# Patient Record
Sex: Female | Born: 1935 | Race: Black or African American | Hispanic: No | Marital: Married | Smoking: Never smoker
Health system: Southern US, Community
[De-identification: ages and names within clinical notes are randomized; demographics above are authoritative.]

## PROBLEM LIST (undated history)

## (undated) DIAGNOSIS — Z79810 Long term (current) use of selective estrogen receptor modulators (SERMs): Secondary | ICD-10-CM

## (undated) DIAGNOSIS — I10 Essential (primary) hypertension: Secondary | ICD-10-CM

## (undated) DIAGNOSIS — E559 Vitamin D deficiency, unspecified: Secondary | ICD-10-CM

## (undated) DIAGNOSIS — C50919 Malignant neoplasm of unspecified site of unspecified female breast: Secondary | ICD-10-CM

## (undated) DIAGNOSIS — A0472 Enterocolitis due to Clostridium difficile, not specified as recurrent: Secondary | ICD-10-CM

## (undated) DIAGNOSIS — Z923 Personal history of irradiation: Secondary | ICD-10-CM

## (undated) DIAGNOSIS — J329 Chronic sinusitis, unspecified: Secondary | ICD-10-CM

## (undated) DIAGNOSIS — M109 Gout, unspecified: Secondary | ICD-10-CM

## (undated) DIAGNOSIS — E785 Hyperlipidemia, unspecified: Secondary | ICD-10-CM

## (undated) DIAGNOSIS — M1990 Unspecified osteoarthritis, unspecified site: Secondary | ICD-10-CM

## (undated) HISTORY — PX: CATARACT EXTRACTION, BILATERAL: SHX1313

## (undated) HISTORY — DX: Vitamin D deficiency, unspecified: E55.9

## (undated) HISTORY — DX: Essential (primary) hypertension: I10

## (undated) HISTORY — DX: Unspecified osteoarthritis, unspecified site: M19.90

## (undated) HISTORY — DX: Personal history of irradiation: Z92.3

## (undated) HISTORY — DX: Hyperlipidemia, unspecified: E78.5

## (undated) HISTORY — DX: Gout, unspecified: M10.9

## (undated) HISTORY — PX: TONSILLECTOMY: SUR1361

## (undated) HISTORY — DX: Long term (current) use of selective estrogen receptor modulators (serms): Z79.810

## (undated) HISTORY — DX: Enterocolitis due to Clostridium difficile, not specified as recurrent: A04.72

## (undated) HISTORY — DX: Chronic sinusitis, unspecified: J32.9

---

## 1992-06-17 HISTORY — PX: ABDOMINAL HYSTERECTOMY: SHX81

## 2000-02-01 HISTORY — PX: BREAST SURGERY: SHX581

## 2010-10-25 ENCOUNTER — Encounter: Payer: Self-pay | Admitting: Internal Medicine

## 2010-10-25 DIAGNOSIS — E785 Hyperlipidemia, unspecified: Secondary | ICD-10-CM | POA: Insufficient documentation

## 2010-10-25 DIAGNOSIS — I10 Essential (primary) hypertension: Secondary | ICD-10-CM

## 2010-12-05 ENCOUNTER — Ambulatory Visit (HOSPITAL_COMMUNITY)
Admission: RE | Admit: 2010-12-05 | Discharge: 2010-12-05 | Disposition: A | Discharge status: Home / Self Care | Payer: PRIVATE HEALTH INSURANCE | Source: Ambulatory Visit | Attending: Internal Medicine | Admitting: Internal Medicine

## 2010-12-05 ENCOUNTER — Other Ambulatory Visit: Payer: Self-pay | Admitting: Internal Medicine

## 2010-12-05 DIAGNOSIS — M545 Low back pain, unspecified: Secondary | ICD-10-CM | POA: Insufficient documentation

## 2010-12-05 DIAGNOSIS — R52 Pain, unspecified: Secondary | ICD-10-CM

## 2010-12-05 DIAGNOSIS — M47817 Spondylosis without myelopathy or radiculopathy, lumbosacral region: Secondary | ICD-10-CM | POA: Insufficient documentation

## 2010-12-05 DIAGNOSIS — M79609 Pain in unspecified limb: Secondary | ICD-10-CM | POA: Insufficient documentation

## 2011-06-14 ENCOUNTER — Encounter (INDEPENDENT_AMBULATORY_CARE_PROVIDER_SITE_OTHER): Payer: Self-pay | Admitting: General Surgery

## 2011-06-19 ENCOUNTER — Ambulatory Visit (INDEPENDENT_AMBULATORY_CARE_PROVIDER_SITE_OTHER): Payer: Medicare Other | Admitting: General Surgery

## 2011-06-19 ENCOUNTER — Encounter (INDEPENDENT_AMBULATORY_CARE_PROVIDER_SITE_OTHER): Payer: Self-pay | Admitting: General Surgery

## 2011-06-19 VITALS — BP 158/92 | HR 82 | Temp 98.2°F | Resp 18 | Ht 60.0 in | Wt 210.0 lb

## 2011-06-19 DIAGNOSIS — R920 Mammographic microcalcification found on diagnostic imaging of breast: Secondary | ICD-10-CM

## 2011-06-19 NOTE — Patient Instructions (Signed)
There is an abnormal area of calcifications in your right breast. The biopsy shows atypical ductal hyperplasia, which could lead to cancer. It is also possible that there is early cancer already present. You will be scheduled for a right breast biopsy with needle localization in the near future.  Breast Biopsy WHY YOU NEED A BIOPSY Your caregiver has recommended that you have a breast tissue sample taken (biopsy). This is done to be certain that the lump or abnormality found in your breast is not cancerous (malignant). During a biopsy, a small piece of tissue is removed, so it can be examined under a microscope by a specialist (pathologist) who looks at tissues and cells and diagnoses abnormalities in them. Most lumps (tumors) or abnormalities, on or in the breast, are not cancerous (benign). However, biopsies are taken when your caregiver cannot be absolutely certain of what is wrong only from doing a physical exam, mammogram (breast X-ray), or other studies. A breast biopsy can tell you whether nothing more needs to be done, or you need more surgery or another type of treatment. A biopsy is done when there is:  Any undiagnosed breast mass.   Nipple abnormalities, dimpling, crusting, or ulcerations.   Calcium deposits (calcifications) or abnormalities seen on your mammogram, ultrasound, or MRI.   Suspicious changes in the breast (thickening, asymmetry) seen on mammogram.   Abnormal discharge from the nipple, especially blood.   Redness, swelling, and pain of the breast.  HOW A BIOPSY IS PERFORMED A biopsy is often performed on an outpatient basis (you go home the same day). This can be done in a hospital, clinic, or surgical center. Tissue samples (biopsies) are often done under local anesthesia (area is numbed). Sometimes general anesthetics are required, in which case you sleep through the procedure. Biopsies may remove the entire lump, a small piece of the lump, or a small sliver of tissue  removed by needle. TYPES OF BREAST BIOPSY  Fine needle aspiration. A thin needle is placed through the skin, to the lump or cyst, and cells are removed.   Core needle biopsy. A large needle with a special tip is placed through the skin, to the abnormality, and a piece of tissue is removed.   Stereotactic biopsy. A core needle with a special X-ray is used, to direct the needle to the lump or abnormal area, which is difficult to feel or cannot be felt.   Vacuum-assisted biopsy. A hollow probe and a gentle vacuum remove a sample of tissue.   Ultrasound guided core needle biopsy. You lie on your stomach, with your breast through an opening, and a high frequency ultrasound helps guide the needle to the area of the abnormality.   Open biopsy. An incision is made in the breast, and a piece of the lump or the whole lump is removed.  LET YOUR CAREGIVER KNOW ABOUT:  Allergies.   Medicines taken, including herbs, eye drops, over-the-counter medicines, and creams.   Use of steroids (by mouth or creams).   Previous problems with anesthetics or Novocaine.   If you are taking aspirin or blood thinners.   Possibility of pregnancy, if this applies.   History of blood clots (thrombophlebitis).   History of bleeding or blood problems.   Previous surgery.   Other health problems.  RISKS AND COMPLICATIONS   Bleeding.   Infection.   Allergy to medicines.   Bruising and swelling of the breast.   Alteration in the shape of the breast.   Not finding  the lump or abnormality.   Needing more surgery.  BEFORE THE PROCEDURE  You should arrive 60 minutes prior to your procedure or as directed.   Check-in at the admissions desk, to fill out necessary forms, if you are not preregistered.   There will be consent forms to sign, prior to the procedure.   There is a waiting area for your family, while you are having your biopsy.   Try to have someone with you, to drive you home.   Do not  smoke for 2 weeks before the surgery.   Let your caregiver know if you develop a cold or an infection.   Do not drink alcohol for at least 24 hours before surgery.   Wear a good support bra to the surgery.  AFTER THE PROCEDURE  After surgery, you will be taken to the recovery area, where a nurse will watch and check your progress. Once you are awake, stable, and taking fluids well, if there are no other problems, you will be allowed to go home.   Ice packs applied to your operative site may help with discomfort and keep the swelling down.   You may resume normal diet and activities as directed. Avoid strenuous activities affecting the arm on the side of the biopsy, such as tennis, swimming, heavy lifting (more than 10 pounds) or pulling.   Bruising in the breast is normal following this procedure.   Wearing a support bra, even to bed, may be more comfortable. The bra will also help keep the dressing on.   Change dressings as directed.   Your doctor may apply a pressure dressing on your breast for 24 to 48 hours.   Only take over-the-counter or prescription medicines for pain, discomfort, or fever as directed by your caregiver.   Do not take aspirin, because it can cause bleeding.  HOME CARE INSTRUCTIONS   You may resume your usual diet.   Have someone drive you home after the surgery.   Do not do any exercise, driving, lifting or general activities without your caregiver's permission.   Take medicines and over-the-counter medicines, as ordered by your caregiver.   Keep your postoperative appointments as recommended.   Do not drink alcohol while taking pain medicine.  Finding out the results of your test Not all test results are available during your visit. If your test results are not back during the visit, make an appointment with your caregiver to find out the results. Do not assume everything is normal if you have not heard from your caregiver or the medical facility. It is  important for you to follow up on all of your test results.  SEEK MEDICAL CARE IF:   You notice redness, swelling, or increasing pain in the wound.   You notice a bad smell coming from the wound or dressing.   You develop a rash.   You need stronger pain medicine.   You are having an allergic reaction or problems with your medicines.  SEEK IMMEDIATE MEDICAL CARE IF:   You have difficulty breathing.   You have a fever.   There is increased bleeding (more than a small spot) from the wound.   Pus is coming from the wound.   The wound is breaking open.  Document Released: 06/03/2005 Document Revised: 02/13/2011 Document Reviewed: 04/21/2009 [facility] Patient Information 2012 [place], [place].

## 2011-06-19 NOTE — Progress Notes (Signed)
Patient ID: Nicole Holland, female   DOB: 08-Jan-1936, 76 y.o.   MRN: [id number]  Chief Complaint  Patient presents with  . Other    Eval abnormal biopsy - atypical ductal hyperplasia    HPI Nicole Holland is a 76 y.o. female.  She is referred by Dr. Jeralyn Ruths for evaluation of abnormal mammogram of the right breast and biopsy showing atypical ductal hyperplasia. Dr. Margaretmary Bayley is her primary care physician.  The patient has a past history of a right breast biopsy in 2001 in [place]. This was a papilloma with focal atypia. She had image guided core biopsy of the left breast in 2002 in [place] which showed fibrocystic disease and calcifications. She's had no other breast problems. She moved to [place] in 2006 to be closer to her son.  Mammograms last year looked fine except for a 9 mm mass in the left breast at the 12:00 position which was thought to be a cyst. Mammograms on May 15, 2011 showed a new area of pleomorphic calcifications in the right breast at the 12:00 position thought to be category 4. Core biopsy was performed on May 23, 2011 which shows atypical ductal hyperplasia and a fibroadenoma with calcifications. She was referred for excision of this area to rule out occult malignancy.  The patient does not have any other breast problems. She does not perceive a mass pain or discharge. Her family history is negative for breast or ovarian cancer. HPI  Past Medical History  Diagnosis Date  . Hypertension   . Hyperlipidemia   . Arthritis   . Asthma     Past Surgical History  Procedure Date  . Abdominal hysterectomy 1994    partial  . Breast surgery 02/01/2000    right breast subareolar core biopsy    History reviewed. No pertinent family history.  Social History History  Substance Use Topics  . Smoking status: Never Smoker   . Smokeless tobacco: Never Used  . Alcohol Use: No    Allergies  Allergen Reactions  . Iodides Itching    All over the body.  .  Penicillins Itching    All over the body.    Current Outpatient Prescriptions  Medication Sig Dispense Refill  . albuterol (PROAIR HFA) 108 (90 BASE) MCG/ACT inhaler Inhale 2 puffs into the lungs every 6 (six) hours as needed.        . Calcium Carbonate-Vit D-Min (CALCIUM 1200 PO) Take by mouth.        . Cholecalciferol (VITAMIN D) 1000 UNITS capsule Take 1,000 Units by mouth daily.        Marland Kitchen diltiazem (DILACOR XR) 240 MG 24 hr capsule Take 240 mg by mouth daily.        Marland Kitchen latanoprost (XALATAN) 0.005 % ophthalmic solution Place 1 drop into both eyes at bedtime.        Marland Kitchen loratadine (CLARITIN) 10 MG tablet Take 10 mg by mouth daily.        Marland Kitchen losartan-hydrochlorothiazide (HYZAAR) 100-25 MG per tablet Take 1 tablet by mouth daily.        . mometasone (NASONEX) 50 MCG/ACT nasal spray Place 2 sprays into the nose daily.        . NON FORMULARY as needed. Taking "Patalax" 0.2% eye drops into both eyes as needed.       . NON FORMULARY 5 mLs as needed. Patient using OTC Eye Itch Relief from [facility] - Ketotifen fumarate eye solution       .  Polyethylene Glycol 400 (BLINK TEARS) 0.25 % SOLN Apply to eye.        . simvastatin (ZOCOR) 20 MG tablet Take 20 mg by mouth at bedtime.        . vitamin C (ASCORBIC ACID) 500 MG tablet Take 500 mg by mouth daily.        . budesonide-formoterol (SYMBICORT) 160-4.5 MCG/ACT inhaler Inhale 2 puffs into the lungs 2 (two) times daily.        . Multiple Vitamins-Minerals (CENTRUM SILVER PO) Take by mouth.        . olmesartan-hydrochlorothiazide (BENICAR HCT) 40-25 MG per tablet Take 1 tablet by mouth daily.        . Omega-3 Fatty Acids (FISH OIL) 1200 MG CAPS Take by mouth.          Review of Systems Review of Systems  Constitutional: Negative for fever, chills and unexpected weight change.  HENT: Negative for hearing loss, congestion, sore throat, trouble swallowing and voice change.   Eyes: Negative for visual disturbance.  Respiratory: Negative for cough and  wheezing.   Cardiovascular: Negative for chest pain, palpitations and leg swelling.  Gastrointestinal: Negative for nausea, vomiting, abdominal pain, diarrhea, constipation, blood in stool, abdominal distention and anal bleeding.  Genitourinary: Negative for hematuria, vaginal bleeding and difficulty urinating.  Musculoskeletal: Positive for joint swelling. Negative for arthralgias.  Skin: Negative for rash and wound.  Neurological: Negative for seizures, syncope and headaches.  Hematological: Negative for adenopathy. Does not bruise/bleed easily.  Psychiatric/Behavioral: Negative for confusion.    Blood pressure 158/92, pulse 82, temperature 98.2 F (36.8 C), temperature source Temporal, resp. rate 18, height 5' (1.524 m), weight 210 lb (95.255 kg).  Physical Exam Physical Exam  Constitutional: She is oriented to person, place, and time. She appears well-developed and well-nourished. No distress.  HENT:  Head: Normocephalic and atraumatic.  Nose: Nose normal.  Mouth/Throat: No oropharyngeal exudate.  Eyes: Conjunctivae and EOM are normal. Pupils are equal, round, and reactive to light. Left eye exhibits no discharge. No scleral icterus.  Neck: Neck supple. No JVD present. No tracheal deviation present. No thyromegaly present.  Cardiovascular: Normal rate, regular rhythm, normal heart sounds and intact distal pulses.   No murmur heard. Pulmonary/Chest: Effort normal and breath sounds normal. No respiratory distress. She has no wheezes. She has no rales. She exhibits no tenderness.    Abdominal: Soft. Bowel sounds are normal. She exhibits no distension and no mass. There is no tenderness. There is no rebound and no guarding.  Musculoskeletal: She exhibits edema. She exhibits no tenderness.  Lymphadenopathy:    She has no cervical adenopathy.  Neurological: She is alert and oriented to person, place, and time. She exhibits normal muscle tone. Coordination normal.  Skin: Skin is warm.  No rash noted. She is not diaphoretic. No erythema. No pallor.  Psychiatric: She has a normal mood and affect. Her behavior is normal. Judgment and thought content normal.    Data Reviewed I have reviewed all of her imaging studies and her pathology report. Also reviewed the records from [place] that she brought with her.  Assessment    Abnormal mammograms and atypical ductal hyperplasia, right breast, 12:00 position. Agree that excision of this area is indicated to a exclude occult malignancy.  Past history excisional biopsy right breast, 9:00 position.  Asthma  Hypertension  Hyperlipidemia  Status post vaginal hysterectomy.  Obesity     Plan    I had a long discussion with the patient. At  the end she wanted to go ahead with excision of the abnormal area in her right breast.  I have discussed indications and details of surgery with her. Risks and complications have been outlined, including but not limited to bleeding, infection, reoperation if this turns out to be a cancer, cosmetic deformity, cardiac, pulmonary and thromboembolic problems. She understands these issues. All questions answered. She agrees with the plan.       Rani Idler M 06/19/2011, 2:44 PM

## 2011-06-20 ENCOUNTER — Encounter (INDEPENDENT_AMBULATORY_CARE_PROVIDER_SITE_OTHER): Payer: Self-pay | Admitting: Internal Medicine

## 2011-07-29 ENCOUNTER — Encounter (HOSPITAL_BASED_OUTPATIENT_CLINIC_OR_DEPARTMENT_OTHER)
Admission: RE | Admit: 2011-07-29 | Discharge: 2011-07-29 | Disposition: A | Discharge status: Home / Self Care | Payer: Medicare Other | Source: Ambulatory Visit | Attending: General Surgery | Admitting: General Surgery

## 2011-07-29 ENCOUNTER — Encounter (HOSPITAL_BASED_OUTPATIENT_CLINIC_OR_DEPARTMENT_OTHER): Payer: Self-pay | Admitting: *Deleted

## 2011-07-29 NOTE — Progress Notes (Signed)
To come in for ekg,cxr,bmet

## 2011-08-01 ENCOUNTER — Ambulatory Visit
Admission: RE | Admit: 2011-08-01 | Discharge: 2011-08-01 | Disposition: A | Discharge status: Home / Self Care | Payer: Medicare Other | Source: Ambulatory Visit | Attending: Anesthesiology | Admitting: Anesthesiology

## 2011-08-01 ENCOUNTER — Encounter (HOSPITAL_BASED_OUTPATIENT_CLINIC_OR_DEPARTMENT_OTHER)
Admission: RE | Admit: 2011-08-01 | Discharge: 2011-08-01 | Disposition: A | Discharge status: Home / Self Care | Payer: Medicare Other | Source: Ambulatory Visit | Attending: General Surgery | Admitting: General Surgery

## 2011-08-01 LAB — BASIC METABOLIC PANEL
CO2: 28 mEq/L (ref 19–32)
Calcium: 10.1 mg/dL (ref 8.4–10.5)
Chloride: 101 mEq/L (ref 96–112)
Glucose, Bld: 175 mg/dL — ABNORMAL HIGH (ref 70–99)
Potassium: 4.2 mEq/L (ref 3.5–5.1)
Sodium: 138 mEq/L (ref 135–145)

## 2011-08-01 NOTE — H&P (Signed)
Nicole Holland    MRN: [id number]   Description: 76 year old female  Provider: [name], MD  Department: [facility]      Diagnoses     Abnormal mammogram with microcalcification   - Primary    793.81      Reason for Visit     Other    Eval abnormal biopsy - atypical ductal hyperplasia        Vitals - Last Recorded       BP Pulse Temp(Src) Resp Ht Wt    158/92  82  98.2 F (36.8 C) (Temporal)  18  5' (1.524 m)  210 lb (95.255 kg)     BMI - 41.01 kg/m2                History and Physical    [name], MD   Patient ID: Nicole Holland, female   DOB: 03-23-1936, 76 y.o.   MRN: [id number]    Chief Complaint   Patient presents with   .  Other       Eval abnormal biopsy - atypical ductal hyperplasia      HPI [name] is a 76 y.o. female.  She is referred by Dr. Jeralyn Ruths for evaluation of abnormal mammogram of the right breast and biopsy showing atypical ductal hyperplasia. Dr. Margaretmary Bayley is her primary care physician.  The patient has a past history of a right breast biopsy in 2001 in [place]. This was a papilloma with focal atypia. She had image guided core biopsy of the left breast in 2002 in [place] which showed fibrocystic disease and calcifications. She's had no other breast problems. She moved to [place] in 2006 to be closer to her son.  Mammograms last year looked fine except for a 9 mm mass in the left breast at the 12:00 position which was thought to be a cyst. Mammograms on May 15, 2011 showed a new area of pleomorphic calcifications in the right breast at the 12:00 position thought to be category 4. Core biopsy was performed on May 23, 2011 which shows atypical ductal hyperplasia and a fibroadenoma with calcifications. She was referred for excision of this area to rule out occult malignancy.  The patient does not have any other breast problems. She does not perceive a mass pain or discharge. Her family history is negative  for breast or ovarian cancer.    Past Medical History   Diagnosis  Date   .  Hypertension     .  Hyperlipidemia     .  Arthritis     .  Asthma         Past Surgical History   Procedure  Date   .  Abdominal hysterectomy  1994       partial   .  Breast surgery  02/01/2000       right breast subareolar core biopsy    History reviewed. No pertinent family history.   Social History History   Substance Use Topics   .  Smoking status:  Never Smoker    .  Smokeless tobacco:  Never Used   .  Alcohol Use:  No       Allergies   Allergen  Reactions   .  Iodides  Itching       All over the body.   .  Penicillins  Itching       All over the body.       Current Outpatient Prescriptions  Medication  Sig  Dispense  Refill   .  albuterol (PROAIR HFA) 108 (90 BASE) MCG/ACT inhaler  Inhale 2 puffs into the lungs every 6 (six) hours as needed.           .  Calcium Carbonate-Vit D-Min (CALCIUM 1200 PO)  Take by mouth.           .  Cholecalciferol (VITAMIN D) 1000 UNITS capsule  Take 1,000 Units by mouth daily.           Marland Kitchen  diltiazem (DILACOR XR) 240 MG 24 hr capsule  Take 240 mg by mouth daily.           Marland Kitchen  latanoprost (XALATAN) 0.005 % ophthalmic solution  Place 1 drop into both eyes at bedtime.           Marland Kitchen  loratadine (CLARITIN) 10 MG tablet  Take 10 mg by mouth daily.           Marland Kitchen  losartan-hydrochlorothiazide (HYZAAR) 100-25 MG per tablet  Take 1 tablet by mouth daily.           .  mometasone (NASONEX) 50 MCG/ACT nasal spray  Place 2 sprays into the nose daily.           .  NON FORMULARY  as needed. Taking "Patalax" 0.2% eye drops into both eyes as needed.          .  NON FORMULARY  5 mLs as needed. Patient using OTC Eye Itch Relief from [facility] - Ketotifen fumarate eye solution          .  Polyethylene Glycol 400 (BLINK TEARS) 0.25 % SOLN  Apply to eye.           .  simvastatin (ZOCOR) 20 MG tablet  Take 20 mg by mouth at bedtime.           .  vitamin C (ASCORBIC ACID) 500 MG tablet   Take 500 mg by mouth daily.           .  budesonide-formoterol (SYMBICORT) 160-4.5 MCG/ACT inhaler  Inhale 2 puffs into the lungs 2 (two) times daily.           .  Multiple Vitamins-Minerals (CENTRUM SILVER PO)  Take by mouth.           .  olmesartan-hydrochlorothiazide (BENICAR HCT) 40-25 MG per tablet  Take 1 tablet by mouth daily.           .  Omega-3 Fatty Acids (FISH OIL) 1200 MG CAPS  Take by mouth.            Review of Systems   Constitutional: Negative for fever, chills and unexpected weight change.  HENT: Negative for hearing loss, congestion, sore throat, trouble swallowing and voice change.   Eyes: Negative for visual disturbance.  Respiratory: Negative for cough and wheezing.   Cardiovascular: Negative for chest pain, palpitations and leg swelling.  Gastrointestinal: Negative for nausea, vomiting, abdominal pain, diarrhea, constipation, blood in stool, abdominal distention and anal bleeding.  Genitourinary: Negative for hematuria, vaginal bleeding and difficulty urinating.  Musculoskeletal: Positive for joint swelling. Negative for arthralgias.  Skin: Negative for rash and wound.  Neurological: Negative for seizures, syncope and headaches.  Hematological: Negative for adenopathy. Does not bruise/bleed easily.  Psychiatric/Behavioral: Negative for confusion.    Blood pressure 158/92, pulse 82, temperature 98.2 F (36.8 C), temperature source Temporal, resp. rate 18, height 5' (1.524 m), weight 210 lb (95.255 kg).  Physical Exam   Constitutional: She is oriented to person, place, and time. She appears well-developed and well-nourished. No distress.  HENT:   Head: Normocephalic and atraumatic.   Nose: Nose normal.   Mouth/Throat: No oropharyngeal exudate.  Eyes: Conjunctivae and EOM are normal. Pupils are equal, round, and reactive to light. Left eye exhibits no discharge. No scleral icterus.  Neck: Neck supple. No JVD present. No tracheal deviation present. No thyromegaly  present.  Cardiovascular: Normal rate, regular rhythm, normal heart sounds and intact distal pulses.    No murmur heard. Pulmonary/Chest: Effort normal and breath sounds normal. No respiratory distress. She has no wheezes. She has no rales. She exhibits no tenderness.  Breasts:  Old scar at 9 oclock on right. Biopsy site at 12 oclock on right. Left breast exam unremarkable  Abdominal: Soft. Bowel sounds are normal. She exhibits no distension and no mass. There is no tenderness. There is no rebound and no guarding.  Musculoskeletal: She exhibits edema. She exhibits no tenderness.  Lymphadenopathy:    She has no cervical adenopathy.  Neurological: She is alert and oriented to person, place, and time. She exhibits normal muscle tone. Coordination normal.  Skin: Skin is warm. No rash noted. She is not diaphoretic. No erythema. No pallor.  Psychiatric: She has a normal mood and affect. Her behavior is normal. Judgment and thought content normal.  .  Data Reviewed I have reviewed all of her imaging studies and her pathology report. Also reviewed the records from [place] that she brought with her.   Assessment   Abnormal mammograms and atypical ductal hyperplasia, right breast, 12:00 position. Agree that excision of this area is indicated to a exclude occult malignancy.  Past history excisional biopsy right breast, 9:00 position.  Asthma  Hypertension  Hyperlipidemia  Status post vaginal hysterectomy.  Obesity     Plan I had a long discussion with the patient. At the end she wanted to go ahead with excision of the abnormal area in her right breast.  I have discussed indications and details of surgery with her. Risks and complications have been outlined, including but not limited to bleeding, infection, reoperation if this turns out to be a cancer, cosmetic deformity, cardiac, pulmonary and thromboembolic problems. She understands these issues. All questions answered. She agrees  with the plan.    Angelia Mould. Derrell Lolling, M.D., [facility] General and Minimally invasive Surgery Breast and Colorectal Surgery Office:   [phone] Pager:   [phone]

## 2011-08-02 ENCOUNTER — Ambulatory Visit (HOSPITAL_BASED_OUTPATIENT_CLINIC_OR_DEPARTMENT_OTHER)
Admission: RE | Admit: 2011-08-02 | Discharge: 2011-08-02 | Disposition: A | Discharge status: Home / Self Care | Payer: Medicare Other | Source: Ambulatory Visit | Attending: General Surgery | Admitting: General Surgery

## 2011-08-02 ENCOUNTER — Encounter (HOSPITAL_BASED_OUTPATIENT_CLINIC_OR_DEPARTMENT_OTHER): Payer: Self-pay | Admitting: Certified Registered Nurse Anesthetist

## 2011-08-02 ENCOUNTER — Encounter (HOSPITAL_BASED_OUTPATIENT_CLINIC_OR_DEPARTMENT_OTHER)
Admission: RE | Disposition: A | Discharge status: Home / Self Care | Payer: Self-pay | Source: Ambulatory Visit | Attending: General Surgery

## 2011-08-02 ENCOUNTER — Other Ambulatory Visit (INDEPENDENT_AMBULATORY_CARE_PROVIDER_SITE_OTHER): Payer: Self-pay | Admitting: General Surgery

## 2011-08-02 ENCOUNTER — Ambulatory Visit (HOSPITAL_BASED_OUTPATIENT_CLINIC_OR_DEPARTMENT_OTHER): Payer: Medicare Other | Admitting: Certified Registered Nurse Anesthetist

## 2011-08-02 DIAGNOSIS — C50919 Malignant neoplasm of unspecified site of unspecified female breast: Secondary | ICD-10-CM

## 2011-08-02 DIAGNOSIS — R920 Mammographic microcalcification found on diagnostic imaging of breast: Secondary | ICD-10-CM

## 2011-08-02 DIAGNOSIS — D0590 Unspecified type of carcinoma in situ of unspecified breast: Secondary | ICD-10-CM

## 2011-08-02 DIAGNOSIS — J45909 Unspecified asthma, uncomplicated: Secondary | ICD-10-CM | POA: Insufficient documentation

## 2011-08-02 DIAGNOSIS — I10 Essential (primary) hypertension: Secondary | ICD-10-CM | POA: Insufficient documentation

## 2011-08-02 DIAGNOSIS — Z01812 Encounter for preprocedural laboratory examination: Secondary | ICD-10-CM | POA: Insufficient documentation

## 2011-08-02 HISTORY — DX: Malignant neoplasm of unspecified site of unspecified female breast: C50.919

## 2011-08-02 HISTORY — PX: BREAST LUMPECTOMY: SHX2

## 2011-08-02 SURGERY — BREAST BIOPSY WITH NEEDLE LOCALIZATION
Anesthesia: General | Site: Breast | Laterality: Right

## 2011-08-02 MED ORDER — PROPOFOL 10 MG/ML IV EMUL
INTRAVENOUS | Status: DC | PRN
  Administered 2011-08-02: 200 mg via INTRAVENOUS

## 2011-08-02 MED ORDER — LIDOCAINE HCL (CARDIAC) 20 MG/ML IV SOLN
INTRAVENOUS | Status: DC | PRN
  Administered 2011-08-02: 60 mg via INTRAVENOUS

## 2011-08-02 MED ORDER — ACETAMINOPHEN 650 MG RE SUPP: 650.0000 mg | RECTAL | Status: DC | PRN

## 2011-08-02 MED ORDER — HYDROCODONE-ACETAMINOPHEN 5-325 MG PO TABS: 1.0000 | ORAL_TABLET | ORAL | Status: AC | PRN

## 2011-08-02 MED ORDER — DEXAMETHASONE SODIUM PHOSPHATE 4 MG/ML IJ SOLN
INTRAMUSCULAR | Status: DC | PRN
  Administered 2011-08-02: 4 mg via INTRAVENOUS

## 2011-08-02 MED ORDER — MIDAZOLAM HCL 2 MG/2ML IJ SOLN: 0.5000 mg | INTRAMUSCULAR | Status: DC | PRN

## 2011-08-02 MED ORDER — METOCLOPRAMIDE HCL 5 MG/ML IJ SOLN: 10.0000 mg | Freq: Once | INTRAMUSCULAR | Status: DC | PRN

## 2011-08-02 MED ORDER — MIDAZOLAM HCL 5 MG/5ML IJ SOLN: INTRAMUSCULAR | Status: DC | PRN

## 2011-08-02 MED ORDER — OXYCODONE HCL 5 MG PO TABS: 5.0000 mg | ORAL_TABLET | ORAL | Status: DC | PRN

## 2011-08-02 MED ORDER — EPHEDRINE SULFATE 50 MG/ML IJ SOLN
INTRAMUSCULAR | Status: DC | PRN
  Administered 2011-08-02: 10 mg via INTRAVENOUS

## 2011-08-02 MED ORDER — SODIUM CHLORIDE 0.9 % IV SOLN: INTRAVENOUS | Status: DC

## 2011-08-02 MED ORDER — ACETAMINOPHEN 325 MG PO TABS: 650.0000 mg | ORAL_TABLET | ORAL | Status: DC | PRN

## 2011-08-02 MED ORDER — SODIUM CHLORIDE 0.9 % IV SOLN: 250.0000 mL | INTRAVENOUS | Status: DC | PRN

## 2011-08-02 MED ORDER — BUPIVACAINE-EPINEPHRINE 0.5% -1:200000 IJ SOLN
INTRAMUSCULAR | Status: DC | PRN
  Administered 2011-08-02: 20 mL

## 2011-08-02 MED ORDER — CEFAZOLIN SODIUM-DEXTROSE 2-3 GM-% IV SOLR
2.0000 g | INTRAVENOUS | Status: AC
  Administered 2011-08-02: 2 g via INTRAVENOUS

## 2011-08-02 MED ORDER — ONDANSETRON HCL 4 MG/2ML IJ SOLN
INTRAMUSCULAR | Status: DC | PRN
  Administered 2011-08-02: 4 mg via INTRAVENOUS

## 2011-08-02 MED ORDER — ACETAMINOPHEN 10 MG/ML IV SOLN
1000.0000 mg | Freq: Once | INTRAVENOUS | Status: AC
  Administered 2011-08-02: 1000 mg via INTRAVENOUS

## 2011-08-02 MED ORDER — LACTATED RINGERS IV SOLN
INTRAVENOUS | Status: DC
  Administered 2011-08-02: 09:00:00 via INTRAVENOUS

## 2011-08-02 MED ORDER — FENTANYL CITRATE 0.05 MG/ML IJ SOLN: 25.0000 ug | INTRAMUSCULAR | Status: DC | PRN

## 2011-08-02 MED ORDER — PROMETHAZINE HCL 25 MG/ML IJ SOLN: 12.5000 mg | Freq: Four times a day (QID) | INTRAMUSCULAR | Status: DC | PRN

## 2011-08-02 MED ORDER — ONDANSETRON HCL 4 MG/2ML IJ SOLN: 4.0000 mg | Freq: Four times a day (QID) | INTRAMUSCULAR | Status: DC | PRN

## 2011-08-02 MED ORDER — SODIUM CHLORIDE 0.9 % IJ SOLN: 3.0000 mL | Freq: Two times a day (BID) | INTRAMUSCULAR | Status: DC

## 2011-08-02 MED ORDER — SODIUM CHLORIDE 0.9 % IJ SOLN: 3.0000 mL | INTRAMUSCULAR | Status: DC | PRN

## 2011-08-02 MED ORDER — FENTANYL CITRATE 0.05 MG/ML IJ SOLN: 50.0000 ug | INTRAMUSCULAR | Status: DC | PRN

## 2011-08-02 MED ORDER — MORPHINE SULFATE 2 MG/ML IJ SOLN: 0.0500 mg/kg | INTRAMUSCULAR | Status: DC | PRN

## 2011-08-02 MED ORDER — FENTANYL CITRATE 0.05 MG/ML IJ SOLN
INTRAMUSCULAR | Status: DC | PRN
  Administered 2011-08-02: 50 ug via INTRAVENOUS

## 2011-08-02 SURGICAL SUPPLY — 54 items
BANDAGE ELASTIC 6 VELCRO ST LF (GAUZE/BANDAGES/DRESSINGS) IMPLANT
BENZOIN TINCTURE PRP APPL 2/3 (GAUZE/BANDAGES/DRESSINGS) IMPLANT
BLADE HEX COATED 2.75 (ELECTRODE) ×2 IMPLANT
BLADE SURG 15 STRL LF DISP TIS (BLADE) ×2 IMPLANT
BLADE SURG 15 STRL SS (BLADE) ×2
CANISTER SUCTION 1200CC (MISCELLANEOUS) ×2 IMPLANT
CHLORAPREP W/TINT 26ML (MISCELLANEOUS) ×2 IMPLANT
CLOTH BEACON ORANGE TIMEOUT ST (SAFETY) ×2 IMPLANT
COVER MAYO STAND STRL (DRAPES) ×2 IMPLANT
COVER TABLE BACK 60X90 (DRAPES) ×2 IMPLANT
DECANTER SPIKE VIAL GLASS SM (MISCELLANEOUS) IMPLANT
DERMABOND ADVANCED (GAUZE/BANDAGES/DRESSINGS) ×1
DERMABOND ADVANCED .7 DNX12 (GAUZE/BANDAGES/DRESSINGS) ×1 IMPLANT
DEVICE DUBIN W/COMP PLATE 8390 (MISCELLANEOUS) ×2 IMPLANT
DRAPE LAPAROTOMY TRNSV 102X78 (DRAPE) IMPLANT
DRAPE PED LAPAROTOMY (DRAPES) ×2 IMPLANT
DRAPE UTILITY XL STRL (DRAPES) ×2 IMPLANT
ELECT REM PT RETURN 9FT ADLT (ELECTROSURGICAL) ×2
ELECTRODE REM PT RTRN 9FT ADLT (ELECTROSURGICAL) ×1 IMPLANT
GAUZE SPONGE 4X4 12PLY STRL LF (GAUZE/BANDAGES/DRESSINGS) IMPLANT
GAUZE SPONGE 4X4 16PLY XRAY LF (GAUZE/BANDAGES/DRESSINGS) IMPLANT
GLOVE BIOGEL M 7.0 STRL (GLOVE) ×2 IMPLANT
GLOVE ECLIPSE 6.5 STRL STRAW (GLOVE) ×4 IMPLANT
GLOVE EUDERMIC 7 POWDERFREE (GLOVE) ×4 IMPLANT
GLOVE INDICATOR 7.0 STRL GRN (GLOVE) ×2 IMPLANT
GOWN PREVENTION PLUS XLARGE (GOWN DISPOSABLE) ×2 IMPLANT
GOWN PREVENTION PLUS XXLARGE (GOWN DISPOSABLE) ×2 IMPLANT
KIT MARKER MARGIN INK (KITS) ×2 IMPLANT
NEEDLE HYPO 22GX1.5 SAFETY (NEEDLE) IMPLANT
NEEDLE HYPO 25X1 1.5 SAFETY (NEEDLE) ×2 IMPLANT
NS IRRIG 1000ML POUR BTL (IV SOLUTION) ×2 IMPLANT
PACK BASIN DAY SURGERY FS (CUSTOM PROCEDURE TRAY) ×2 IMPLANT
PENCIL BUTTON HOLSTER BLD 10FT (ELECTRODE) ×2 IMPLANT
SLEEVE SCD COMPRESS KNEE MED (MISCELLANEOUS) ×2 IMPLANT
SPONGE LAP 4X18 X RAY DECT (DISPOSABLE) ×2 IMPLANT
STAPLER VISISTAT 35W (STAPLE) IMPLANT
STRIP CLOSURE SKIN 1/2X4 (GAUZE/BANDAGES/DRESSINGS) IMPLANT
SUT ETHILON 4 0 PS 2 18 (SUTURE) IMPLANT
SUT MON AB 4-0 PC3 18 (SUTURE) ×2 IMPLANT
SUT SILK 2 0 SH (SUTURE) ×2 IMPLANT
SUT VIC AB 2-0 SH 27 (SUTURE)
SUT VIC AB 2-0 SH 27XBRD (SUTURE) IMPLANT
SUT VIC AB 3-0 FS2 27 (SUTURE) IMPLANT
SUT VIC AB 4-0 P-3 18XBRD (SUTURE) IMPLANT
SUT VIC AB 4-0 P3 18 (SUTURE)
SUT VICRYL 3-0 CR8 SH (SUTURE) ×2 IMPLANT
SUT VICRYL 4-0 PS2 18IN ABS (SUTURE) IMPLANT
SYR BULB 3OZ (MISCELLANEOUS) IMPLANT
SYR CONTROL 10ML LL (SYRINGE) ×2 IMPLANT
TAPE HYPAFIX 4 X10 (GAUZE/BANDAGES/DRESSINGS) IMPLANT
TOWEL OR NON WOVEN STRL DISP B (DISPOSABLE) ×2 IMPLANT
TUBE CONNECTING 20X1/4 (TUBING) ×2 IMPLANT
WATER STERILE IRR 1000ML POUR (IV SOLUTION) IMPLANT
YANKAUER SUCT BULB TIP NO VENT (SUCTIONS) ×2 IMPLANT

## 2011-08-02 NOTE — Anesthesia Postprocedure Evaluation (Signed)
Anesthesia Post Note  Patient: Nicole Holland  Procedure(s) Performed: Procedure(s) (LRB): BREAST BIOPSY WITH NEEDLE LOCALIZATION (Right)  Anesthesia type: General  Patient location: PACU  Post pain: Pain level controlled  Post assessment: Patient's Cardiovascular Status Stable  Last Vitals:  Filed Vitals:   08/02/11 1045  BP: 108/51  Pulse: 86  Temp:   Resp: 16    Post vital signs: Reviewed and stable  Level of consciousness: alert  Complications: No apparent anesthesia complications

## 2011-08-02 NOTE — Transfer of Care (Signed)
Immediate Anesthesia Transfer of Care Note  Patient: Nicole Holland  Procedure(s) Performed: Procedure(s) (LRB): BREAST BIOPSY WITH NEEDLE LOCALIZATION (Right)  Patient Location: PACU  Anesthesia Type: General  Level of Consciousness: sedated  Airway & Oxygen Therapy: Patient Spontanous Breathing and Patient connected to face mask oxygen  Post-op Assessment: Report given to PACU RN and Post -op Vital signs reviewed and stable  Post vital signs: Reviewed and stable  Complications: No apparent anesthesia complications

## 2011-08-02 NOTE — Op Note (Signed)
Patient Name:           Nicole Holland   Date of Surgery:        08/02/2011  Pre op Diagnosis:      Atypical ductal hyperplasia right breast  Post op Diagnosis:    same  Procedure:                 Right partial mastectomy with needle localization  Surgeon:                     Angelia Mould. Derrell Lolling, M.D., FACS  Assistant:                      none  Operative Indications:   [name]  is referred by Dr. Jeralyn Ruths for evaluation of abnormal mammogram of the right breast and biopsy showing atypical ductal hyperplasia. The patient has a past history of a right breast biopsy in 2001 in [place]. This was a papilloma with focal atypia. She had image guided core biopsy of the left breast in 2002 in [place] which showed fibrocystic disease and calcifications. She's had no other breast problems.  Mammograms last year looked fine except for a 9 mm mass in the left breast at the 12:00 position which was thought to be a cyst. Mammograms on May 15, 2011 showed a new area of pleomorphic calcifications in the right breast at the 12:00 position thought to be category 4. Core biopsy was performed on May 23, 2011 which shows atypical ductal hyperplasia and a fibroadenoma with calcifications. She was referred for excision of this area to rule out occult malignancy.  The patient does not have any other breast problems. She does not perceive a mass pain or discharge. Her family history is negative for breast or ovarian cancer.   Operative Findings:       The breast tissue that was removed looked normal. The marker clip and the wire were seen in the center of the specimen on the specimen mammogram.  Procedure in Detail:          The patient underwent wire localization of the area of abnormal calcifications in the right breast, 12:00 position this morning by Dr. Sharen Hones at [facility]. The wire position was very good. [facility]. Her x-rays were  reviewed. She was taken to the operating room. Gen. Anesthesia was induced. Surgical time out was performed. The right breast was prepped and draped in a sterile fashion. Intravenous antibiotics were given.  A transverse curvilinear incision was made at the 11:30 position of the right breast, just below the insertion site of the wire. Dissection was carried down into the breast tissue around the localizing wire. The specimen was removed and marked with silk sutures and the multicolor ink.kit.   Specimen mammogram confirmed that the localizing wire and the marker clip were within the center of the specimen. The specimen was sent to the pathology lab. The wound was irrigated with saline. Hemostasis was excellent. The breast tissue was closed in several layers with interrupted sutures of 3-0 Vicryl and the skin closed with a running subcuticular suture of 4-0 Monocryl and Dermabond. Ice pack was placed. Patient was taken to recovery room stable. There were no complications. Counts correct. EBL 10 cc.     Angelia Mould. Derrell Lolling, M.D., FACS General and Minimally Invasive Surgery Breast and Colorectal Surgery  08/02/2011 10:06 AM

## 2011-08-02 NOTE — Interval H&P Note (Signed)
History and Physical Interval Note:  08/02/2011 8:43 AM  Nicole Holland  has presented today for surgery, with the diagnosis of right breast mass   The goals of treatment and the various methods of treatment have been discussed with the patient and family. After consideration of risks, benefits and other options for treatment, the patient has consented to  Procedure(s) (LRB): RIGHT BREAST BIOPSY WITH NEEDLE LOCALIZATION (Right) as a surgical intervention .  The patients' history has been reviewed, patient examined, no change in status, stable for surgery.  I have reviewed the patients' chart and labs.  Questions were answered to the patient's satisfaction.     [name]

## 2011-08-02 NOTE — Anesthesia Procedure Notes (Signed)
Procedure Name: LMA Insertion Date/Time: 08/02/2011 9:25 AM Performed by: Janifer Gieselman D Pre-anesthesia Checklist: Patient identified, Emergency Drugs available, Suction available and Patient being monitored Patient Re-evaluated:Patient Re-evaluated prior to inductionOxygen Delivery Method: Circle System Utilized Preoxygenation: Pre-oxygenation with 100% oxygen Intubation Type: IV induction Ventilation: Mask ventilation without difficulty LMA: LMA with gastric port inserted LMA Size: 4.0 Number of attempts: 1 Placement Confirmation: positive ETCO2 Tube secured with: Tape Dental Injury: Teeth and Oropharynx as per pre-operative assessment

## 2011-08-02 NOTE — Discharge Instructions (Signed)
[facility] Office Phone Number [phone]  BREAST BIOPSY/ PARTIAL MASTECTOMY: POST OP INSTRUCTIONS  Always review your discharge instruction sheet given to you by the facility where your surgery was performed.  IF YOU HAVE DISABILITY OR FAMILY LEAVE FORMS, YOU MUST BRING THEM TO THE OFFICE FOR PROCESSING.  DO NOT GIVE THEM TO YOUR DOCTOR.  1. A prescription for pain medication may be given to you upon discharge.  Take your pain medication as prescribed, if needed.  If narcotic pain medicine is not needed, then you may take acetaminophen (Tylenol) or ibuprofen (Advil) as needed. 2. Take your usually prescribed medications unless otherwise directed 3. If you need a refill on your pain medication, please contact your pharmacy.  They will contact our office to request authorization.  Prescriptions will not be filled after 5pm or on week-ends. 4. You should eat very light the first 24 hours after surgery, such as soup, crackers, pudding, etc.  Resume your normal diet the day after surgery. 5. Most patients will experience some swelling and bruising in the breast.  Ice packs and a good support bra will help.  Swelling and bruising can take several days to resolve.  6. It is common to experience some constipation if taking pain medication after surgery.  Increasing fluid intake and taking a stool softener will usually help or prevent this problem from occurring.  A mild laxative (Milk of Magnesia or Miralax) should be taken according to package directions if there are no bowel movements after 48 hours. 7. Unless discharge instructions indicate otherwise, you may remove your bandages 24-48 hours after surgery, and you may shower at that time.  You may have steri-strips (small skin tapes) in place directly over the incision.  These strips should be left on the skin for 7-10 days.  If your surgeon used skin glue on the incision, you may shower in 24 hours.  The glue will flake off over the  next 2-3 weeks.  Any sutures or staples will be removed at the office during your follow-up visit. 8. ACTIVITIES:  You may resume regular daily activities (gradually increasing) beginning the next day.  Wearing a good support bra or sports bra minimizes pain and swelling.  You may have sexual intercourse when it is comfortable. a. You may drive when you no longer are taking prescription pain medication, you can comfortably wear a seatbelt, and you can safely maneuver your car and apply brakes. b. RETURN TO WORK:  ______________________________________________________________________________________ 9. You should see your doctor in the office for a follow-up appointment approximately two weeks after your surgery.  Your doctor's nurse will typically make your follow-up appointment when she calls you with your pathology report.  Expect your pathology report 2-3 business days after your surgery.  You may call to check if you do not hear from us after three days. 10. OTHER INSTRUCTIONS: _______________________________________________________________________________________________ _____________________________________________________________________________________________________________________________________ _____________________________________________________________________________________________________________________________________ _____________________________________________________________________________________________________________________________________  WHEN TO CALL YOUR DOCTOR: 1. Fever over 101.0 2. Nausea and/or vomiting. 3. Extreme swelling or bruising. 4. Continued bleeding from incision. 5. Increased pain, redness, or drainage from the incision.  The clinic staff is available to answer your questions during regular business hours.  Please don't hesitate to call and ask to speak to one of the nurses for clinical concerns.  If you have a medical emergency, go to the nearest  emergency room or call 911.  A surgeon from [facility] is always on call at the hospital.  For further questions, please visit [email or web address]    [facility]  [address] [place], [place] [address] [phone]   Post Anesthesia Home Care Instructions  Activity: Get plenty of rest for the remainder of the day. A responsible adult should stay with you for 24 hours following the procedure.  For the next 24 hours, DO NOT: -Drive a car -Operate machinery -Drink alcoholic beverages -Take any medication unless instructed by your physician -Make any legal decisions or sign important papers.  Meals: Start with liquid foods such as gelatin or soup. Progress to regular foods as tolerated. Avoid greasy, spicy, heavy foods. If nausea and/or vomiting occur, drink only clear liquids until the nausea and/or vomiting subsides. Call your physician if vomiting continues.  Special Instructions/Symptoms: Your throat may feel dry or sore from the anesthesia or the breathing tube placed in your throat during surgery. If this causes discomfort, gargle with warm salt water. The discomfort should disappear within 24 hours.   

## 2011-08-02 NOTE — Anesthesia Preprocedure Evaluation (Signed)
Anesthesia Evaluation  Patient identified by MRN, date of birth, ID band Patient awake    Reviewed: Allergy & Precautions, H&P , NPO status , Patient's Chart, lab work & pertinent test results, reviewed documented beta blocker date and time   Airway Mallampati: II TM Distance: >3 FB Neck ROM: full    Dental   Pulmonary asthma ,          Cardiovascular hypertension, On Medications     Neuro/Psych Negative Neurological ROS  Negative Psych ROS   GI/Hepatic negative GI ROS, Neg liver ROS,   Endo/Other  Morbid obesity  Renal/GU negative Renal ROS  Genitourinary negative   Musculoskeletal   Abdominal   Peds  Hematology negative hematology ROS (+)   Anesthesia Other Findings See surgeon's H&P   Reproductive/Obstetrics negative OB ROS                           Anesthesia Physical Anesthesia Plan  ASA: III  Anesthesia Plan: General   Post-op Pain Management:    Induction: Intravenous  Airway Management Planned: LMA  Additional Equipment:   Intra-op Plan:   Post-operative Plan: Extubation in OR  Informed Consent: I have reviewed the patients History and Physical, chart, labs and discussed the procedure including the risks, benefits and alternatives for the proposed anesthesia with the patient or authorized representative who has indicated his/her understanding and acceptance.     Plan Discussed with: CRNA and Surgeon  Anesthesia Plan Comments:         Anesthesia Quick Evaluation

## 2011-08-05 ENCOUNTER — Encounter (HOSPITAL_BASED_OUTPATIENT_CLINIC_OR_DEPARTMENT_OTHER): Payer: Self-pay | Admitting: General Surgery

## 2011-08-09 ENCOUNTER — Telehealth (INDEPENDENT_AMBULATORY_CARE_PROVIDER_SITE_OTHER): Payer: Self-pay

## 2011-08-09 ENCOUNTER — Telehealth (INDEPENDENT_AMBULATORY_CARE_PROVIDER_SITE_OTHER): Payer: Self-pay | Admitting: General Surgery

## 2011-08-09 ENCOUNTER — Other Ambulatory Visit (INDEPENDENT_AMBULATORY_CARE_PROVIDER_SITE_OTHER): Payer: Self-pay

## 2011-08-09 DIAGNOSIS — D0510 Intraductal carcinoma in situ of unspecified breast: Secondary | ICD-10-CM

## 2011-08-09 NOTE — Telephone Encounter (Signed)
  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 Breast diagnostic profile is pending. I called Nicole Holland and discussed this with her. She will be scheduled for a bilateral breast MRI and then an appointment to see me to discuss next steps   [facility]. Derrell Lolling, M.D., [facility] General and Minimally invasive Surgery Breast and Colorectal Surgery Office:   [phone] Pager:   [phone]

## 2011-08-09 NOTE — Telephone Encounter (Signed)
Message copied by Joanette Gula on Fri Aug 09, 2011  4:13 PM ------      Message from: [name]      Created: Fri Aug 09, 2011  2:25 PM      Regarding: RE: Path result      Contact: [phone]       I spoke to Ms. Sica and her son Mellody Dance. She was told she has DCIS with a close margin.            Please schedule her for bilateral breast MRI and then an appt. to see me after the MRI.            Thanks.                  ----- Message -----         From: Joanette Gula, LPN         Sent: 08/08/2011   3:42 PM           To: [name], MD      Subject: Path result                                              Pt calling for path result. Please view path and advise me. This pt had atypical cells at pre op visit and surgical path show dcis. Pt probably needs call from you. She can be reached at [phone]. Thanks [employer]

## 2011-08-17 ENCOUNTER — Ambulatory Visit
Admission: RE | Admit: 2011-08-17 | Discharge: 2011-08-17 | Disposition: A | Discharge status: Home / Self Care | Payer: Medicare Other | Source: Ambulatory Visit | Attending: General Surgery | Admitting: General Surgery

## 2011-08-17 DIAGNOSIS — D0510 Intraductal carcinoma in situ of unspecified breast: Secondary | ICD-10-CM

## 2011-08-17 MED ORDER — GADOBENATE DIMEGLUMINE 529 MG/ML IV SOLN
9.0000 mL | Freq: Once | INTRAVENOUS | Status: AC | PRN
  Administered 2011-08-17: 9 mL via INTRAVENOUS

## 2011-08-20 ENCOUNTER — Telehealth (INDEPENDENT_AMBULATORY_CARE_PROVIDER_SITE_OTHER): Payer: Self-pay

## 2011-08-20 NOTE — Telephone Encounter (Signed)
Pt notified of MR result. Pt advised I will send msg to Dr Derrell Lolling to ask of pt needs any further referrals and call her with those if indicated.

## 2011-08-21 ENCOUNTER — Telehealth (INDEPENDENT_AMBULATORY_CARE_PROVIDER_SITE_OTHER): Payer: Self-pay

## 2011-08-21 NOTE — Telephone Encounter (Signed)
Per Dr  Jacinto Halim request I called to check on ERPR.  Per Angelica Chessman at [facility]. She also sees on path report that ERPR would be done but no order was placed. She will review this with Dr Raynald Blend and have ERPR ran.

## 2011-08-21 NOTE — Telephone Encounter (Signed)
Message copied by Joanette Gula on Wed Aug 21, 2011  9:35 AM ------      Message from: [name]      Created: Wed Aug 21, 2011  9:26 AM       Tell patient MRI shows no other areas of enhancement, which is good news.  Needs OV to discuss reexcision of margins, assuming she desires breast conservation.            Also, could not find ER/PR.

## 2011-08-26 ENCOUNTER — Telehealth (INDEPENDENT_AMBULATORY_CARE_PROVIDER_SITE_OTHER): Payer: Self-pay | Admitting: General Surgery

## 2011-08-26 NOTE — Telephone Encounter (Signed)
I had a lengthy phone conversation with Ms. Guereca today. I again explained her pathology report revealing 3 small foci of ductal carcinoma in situ. I told her this was hormone receptor positive. I told her that she had a very close anterior margin and that she may require further excision.  I advised her to see a medical oncologist and the radiation oncologist at this time. It was her preference to deferr those consultations until she could see me in the office on March 21 with her son and daughter.  After I see her in the office on March 21, I told her that I would plan to refer her to medical oncology and radiation oncology for any decisions were made regarding reexcision. She seemed reluctant to consider any further surgery. She also did not seem to understand the need for referral to medical oncology and radiation oncology. Hopefully this will become more obvious once we have a conversation with her and her family on March 21.   Angelia Mould. Derrell Lolling, M.D., [facility] General and Minimally invasive Surgery Breast and Colorectal Surgery Office:   [phone] Pager:   [phone]

## 2011-09-05 ENCOUNTER — Encounter (INDEPENDENT_AMBULATORY_CARE_PROVIDER_SITE_OTHER): Payer: Medicare Other | Admitting: General Surgery

## 2011-09-05 ENCOUNTER — Encounter (INDEPENDENT_AMBULATORY_CARE_PROVIDER_SITE_OTHER): Payer: Self-pay | Admitting: General Surgery

## 2011-09-05 ENCOUNTER — Ambulatory Visit (INDEPENDENT_AMBULATORY_CARE_PROVIDER_SITE_OTHER): Payer: Medicare Other | Admitting: General Surgery

## 2011-09-05 VITALS — BP 170/69 | HR 100 | Temp 98.2°F | Ht 60.0 in | Wt 207.4 lb

## 2011-09-05 DIAGNOSIS — D0510 Intraductal carcinoma in situ of unspecified breast: Secondary | ICD-10-CM

## 2011-09-05 DIAGNOSIS — D0590 Unspecified type of carcinoma in situ of unspecified breast: Secondary | ICD-10-CM

## 2011-09-05 NOTE — Progress Notes (Signed)
Patient ID: Nicole Holland, female   DOB: 01-24-1936, 76 y.o.   MRN: [id number]  Chief Complaint  Patient presents with  . Routine Post Op    po br mast    HPI  Nicole Holland is a 76 y.o. female.  She returns with her son to discuss her new diagnosis of DCIS right breast.  The patient recently underwent a right partial mastectomy with needle localization because of calcifications and atypical ductal hyperplasia seen on image guided biopsy. The final pathology report shows a low-grade ductal carcinoma in situ, receptor positive. There were 3 tiny foci of DCIS. One of the foci is less than 1 mm from the anterior margin. She has had MRI which shows no other abnormalities. I discussed this with her by phone and she is here today for further discussion and planning. I gave her a copy of the pathology report.   She has no problems with her wound healing. She is very comfortable.  We talked for a long time about ductal carcinoma in situ. I told her that I felt that this area should be conservatively reexcised, and that she should be referred to a medical oncologist and radiation oncologist. I offered to refer her to the medical oncologist prior to surgery or to proceed with the surgery at this time. After lengthy discussion she decided to proceed with a reexcision first and then would go ahead with the referrals. HPI  Past Medical History  Diagnosis Date  . Hypertension   . Hyperlipidemia   . Arthritis   . Asthma     Past Surgical History  Procedure Date  . Abdominal hysterectomy 1994    partial  . Breast surgery 02/01/2000    right breast subareolar core biopsy  . Cesarean section   . Tonsillectomy   . Breast biopsy 08/02/2011    Procedure: BREAST BIOPSY WITH NEEDLE LOCALIZATION;  Surgeon: [name], MD;  Location: [facility];  Service: General;  Laterality: Right;  excision right breast mass with needle localization at [facility]     History reviewed. No pertinent  family history.  Social History History  Substance Use Topics  . Smoking status: Never Smoker   . Smokeless tobacco: Never Used  . Alcohol Use: No    Allergies  Allergen Reactions  . Iodides Itching    All over the body.  . Penicillins Itching    All over the body.    Current Outpatient Prescriptions  Medication Sig Dispense Refill  . albuterol (PROAIR HFA) 108 (90 BASE) MCG/ACT inhaler Inhale 2 puffs into the lungs every 6 (six) hours as needed.        . budesonide-formoterol (SYMBICORT) 160-4.5 MCG/ACT inhaler Inhale 2 puffs into the lungs 2 (two) times daily.        . Calcium Carbonate-Vit D-Min (CALCIUM 1200 PO) Take by mouth.        . Cholecalciferol (VITAMIN D) 1000 UNITS capsule Take 1,000 Units by mouth daily.        Marland Kitchen diltiazem (DILACOR XR) 240 MG 24 hr capsule Take 240 mg by mouth daily.        Marland Kitchen latanoprost (XALATAN) 0.005 % ophthalmic solution Place 1 drop into both eyes at bedtime.        Marland Kitchen loratadine (CLARITIN) 10 MG tablet Take 10 mg by mouth daily.        Marland Kitchen losartan-hydrochlorothiazide (HYZAAR) 100-25 MG per tablet Take 1 tablet by mouth daily.        Marland Kitchen  mometasone (NASONEX) 50 MCG/ACT nasal spray Place 2 sprays into the nose daily.        . Multiple Vitamins-Minerals (CENTRUM SILVER PO) Take by mouth.        . NON FORMULARY as needed. Taking "Patalax" 0.2% eye drops into both eyes as needed.      . NON FORMULARY 5 mLs as needed. Patient using OTC Eye Itch Relief from [facility] - Ketotifen fumarate eye solution      . olmesartan-hydrochlorothiazide (BENICAR HCT) 40-25 MG per tablet Take 1 tablet by mouth daily.       . Omega-3 Fatty Acids (FISH OIL) 1200 MG CAPS Take by mouth.        . Polyethylene Glycol 400 (BLINK TEARS) 0.25 % SOLN Apply to eye.        . simvastatin (ZOCOR) 20 MG tablet Take 20 mg by mouth at bedtime.        . vitamin C (ASCORBIC ACID) 500 MG tablet Take 500 mg by mouth daily.          Review of Systems Review of Systems  Constitutional:  Negative for fever, chills and unexpected weight change.  HENT: Negative for hearing loss, congestion, sore throat, trouble swallowing and voice change.   Eyes: Negative for visual disturbance.  Respiratory: Negative for cough and wheezing.   Cardiovascular: Negative for chest pain, palpitations and leg swelling.  Gastrointestinal: Negative for nausea, vomiting, abdominal pain, diarrhea, constipation, blood in stool, abdominal distention and anal bleeding.  Genitourinary: Negative for hematuria, vaginal bleeding and difficulty urinating.  Musculoskeletal: Negative for arthralgias.  Skin: Negative for rash and wound.  Neurological: Negative for seizures, syncope and headaches.  Hematological: Negative for adenopathy. Does not bruise/bleed easily.  Psychiatric/Behavioral: Negative for confusion.    Blood pressure 170/69, pulse 100, temperature 98.2 F (36.8 C), temperature source Temporal, height 5' (1.524 m), weight 207 lb 6.4 oz (94.076 kg), SpO2 97.00%.  Physical Exam Physical Exam  Constitutional: She is oriented to person, place, and time. She appears well-developed and well-nourished. No distress.  Eyes: Conjunctivae and EOM are normal. Pupils are equal, round, and reactive to light. Left eye exhibits no discharge. No scleral icterus.  Neck: Neck supple. No JVD present. No tracheal deviation present. No thyromegaly present.  Cardiovascular: Normal rate, regular rhythm, normal heart sounds and intact distal pulses.   No murmur heard. Pulmonary/Chest: Effort normal and breath sounds normal. No respiratory distress. She has no wheezes. She has no rales. She exhibits no tenderness.       Recent incision right breast, 10:30 position, healing well. No fluid collection. No hematoma. No cosmetic defect. Nontender.  Musculoskeletal: She exhibits no edema and no tenderness.  Lymphadenopathy:    She has no cervical adenopathy.  Neurological: She is alert and oriented to person, place, and time.  She exhibits normal muscle tone. Coordination normal.  Skin: Skin is warm. No rash noted. She is not diaphoretic. No erythema. No pallor.  Psychiatric: She has a normal mood and affect. Her behavior is normal. Judgment and thought content normal.    Data Reviewed I have reviewed her pathology report and her MRI.  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  Very close anterior margin, less than 1 mm  Asthma  Diabetes mellitus.  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  Techniques of surgery and risks were discussed in detail. She understands these issues. She agrees with this plan. Her questions were answered. Her son concurs.  Referred to medical oncology and radiation oncology postop.       Angelia Mould. Derrell Lolling, M.D., [facility] General and Minimally invasive Surgery Breast and Colorectal Surgery Office:   [phone] Pager:   [phone]  09/05/2011, 12:18 PM

## 2011-09-05 NOTE — Patient Instructions (Signed)
Your right breast biopsy shows multifocal ductal carcinoma in situ. There is a close margin anteriorly. You have a copy of the pathology report. You will be scheduled for an operation to re excise the right breast lumpectomy site.  After the surgery you will be referred to a medical oncologist and a radiation oncologist.

## 2011-09-12 ENCOUNTER — Encounter (INDEPENDENT_AMBULATORY_CARE_PROVIDER_SITE_OTHER): Payer: Medicare Other | Admitting: General Surgery

## 2011-09-25 ENCOUNTER — Encounter (HOSPITAL_BASED_OUTPATIENT_CLINIC_OR_DEPARTMENT_OTHER)
Admission: RE | Admit: 2011-09-25 | Discharge: 2011-09-25 | Disposition: A | Discharge status: Home / Self Care | Payer: Medicare Other | Source: Ambulatory Visit | Attending: General Surgery | Admitting: General Surgery

## 2011-09-25 LAB — BASIC METABOLIC PANEL
CO2: 26 mEq/L (ref 19–32)
Calcium: 9.8 mg/dL (ref 8.4–10.5)
Creatinine, Ser: 1.38 mg/dL — ABNORMAL HIGH (ref 0.50–1.10)
Glucose, Bld: 101 mg/dL — ABNORMAL HIGH (ref 70–99)

## 2011-09-25 NOTE — Progress Notes (Signed)
Pt here for lumpectomy-has to come back for re-excision-to come in for bmet

## 2011-09-26 NOTE — H&P (Signed)
Nasim Garofano    MRN: [id number]   Description: 76 year old female  Provider: [name], MD  Department: [facility]        Diagnoses     DCIS (ductal carcinoma in situ) of breast   - Primary    233.0      Reason for Visit     Routine Post Op    po br mast       Vitals - Last Recorded       BP Pulse Temp(Src) Ht Wt BMI    170/69  100  98.2 F (36.8 C) (Temporal)  5' (1.524 m)  207 lb 6.4 oz (94.076 kg)  40.51 kg/m2                  History and Physical     [name], MD  Patient ID: Rhodie Cienfuegos, female   DOB: 03-Oct-1935, 76 y.o.   MRN: [id number]     HPI  Julisa Flippo is a 76 y.o. female.  She returns with her son to discuss her new diagnosis of DCIS right breast.  The patient recently underwent a right partial mastectomy with needle localization because of calcifications and atypical ductal hyperplasia seen on image guided biopsy. The final pathology report shows a low-grade ductal carcinoma in situ, receptor positive. There were 3 tiny foci of DCIS. One of the foci is less than 1 mm from the anterior margin. She has had MRI which shows no other abnormalities. I discussed this with her by phone and she is here today for further discussion and planning. I gave her a copy of the pathology report.   She has no problems with her wound healing. She is very comfortable.  We talked for a long time about ductal carcinoma in situ. I told her that I felt that this area should be conservatively reexcised, and that she should be referred to a medical oncologist and radiation oncologist. I offered to refer her to the medical oncologist prior to surgery or to proceed with the surgery at this time. After lengthy discussion she decided to proceed with a reexcision first and then would go ahead with the referrals. HPI    Past Medical History   Diagnosis  Date   .  Hypertension     .  Hyperlipidemia     .  Arthritis     .  Asthma         Past Surgical  History   Procedure  Date   .  Abdominal hysterectomy  1994       partial   .  Breast surgery  02/01/2000       right breast subareolar core biopsy   .  Cesarean section     .  Tonsillectomy     .  Breast biopsy  08/02/2011       Procedure: BREAST BIOPSY WITH NEEDLE LOCALIZATION;  Surgeon: [name], MD;  Location:  SUR[facility]ERY [facility];  Service: General;  Laterality: Right;  excision right breast mass with needle localization at [facility]      History reviewed. No pertinent family history.   Social History History   Substance Use Topics   .  Smoking status:  Never Smoker    .  Smokeless tobacco:  Never Used   .  Alcohol Use:  No       Allergies   Allergen  Reactions   .  Iodides  Itching  All over the body.   .  Penicillins  Itching       All over the body.       Current Outpatient Prescriptions   Medication  Sig  Dispense  Refill   .  albuterol (PROAIR HFA) 108 (90 BASE) MCG/ACT inhaler  Inhale 2 puffs into the lungs every 6 (six) hours as needed.           .  budesonide-formoterol (SYMBICORT) 160-4.5 MCG/ACT inhaler  Inhale 2 puffs into the lungs 2 (two) times daily.           .  Calcium Carbonate-Vit D-Min (CALCIUM 1200 PO)  Take by mouth.           .  Cholecalciferol (VITAMIN D) 1000 UNITS capsule  Take 1,000 Units by mouth daily.           Marland Kitchen  diltiazem (DILACOR XR) 240 MG 24 hr capsule  Take 240 mg by mouth daily.           Marland Kitchen  latanoprost (XALATAN) 0.005 % ophthalmic solution  Place 1 drop into both eyes at bedtime.           Marland Kitchen  loratadine (CLARITIN) 10 MG tablet  Take 10 mg by mouth daily.           Marland Kitchen  losartan-hydrochlorothiazide (HYZAAR) 100-25 MG per tablet  Take 1 tablet by mouth daily.           .  mometasone (NASONEX) 50 MCG/ACT nasal spray  Place 2 sprays into the nose daily.           .  Multiple Vitamins-Minerals (CENTRUM SILVER PO)  Take by mouth.           .  NON FORMULARY  as needed. Taking "Patalax" 0.2% eye drops into both eyes as needed.          .  NON FORMULARY  5 mLs as needed. Patient using OTC Eye Itch Relief from [employer] - Ketotifen fumarate eye solution         .  olmesartan-hydrochlorothiazide (BENICAR HCT) 40-25 MG per tablet  Take 1 tablet by mouth daily.          .  Omega-3 Fatty Acids (FISH OIL) 1200 MG CAPS  Take by mouth.           .  Polyethylene Glycol 400 (BLINK TEARS) 0.25 % SOLN  Apply to eye.           .  simvastatin (ZOCOR) 20 MG tablet  Take 20 mg by mouth at bedtime.           .  vitamin C (ASCORBIC ACID) 500 MG tablet  Take 500 mg by mouth daily.              Review of Systems   Constitutional: Negative for fever, chills and unexpected weight change.  HENT: Negative for hearing loss, congestion, sore throat, trouble swallowing and voice change.   Eyes: Negative for visual disturbance.  Respiratory: Negative for cough and wheezing.   Cardiovascular: Negative for chest pain, palpitations and leg swelling.  Gastrointestinal: Negative for nausea, vomiting, abdominal pain, diarrhea, constipation, blood in stool, abdominal distention and anal bleeding.  Genitourinary: Negative for hematuria, vaginal bleeding and difficulty urinating.  Musculoskeletal: Negative for arthralgias.  Skin: Negative for rash and wound.  Neurological: Negative for seizures, syncope and headaches.  Hematological: Negative for adenopathy. Does not bruise/bleed easily.  Psychiatric/Behavioral: Negative for confusion.  Blood pressure 170/69, pulse 100, temperature 98.2 F (36.8 C), temperature source Temporal, height 5' (1.524 m), weight 207 lb 6.4 oz (94.076 kg), SpO2 97.00%.   Physical Exam   Constitutional: She is oriented to person, place, and time. She appears well-developed and well-nourished. No distress.  Eyes: Conjunctivae and EOM are normal. Pupils are equal, round, and reactive to light. Left eye exhibits no discharge. No scleral icterus.  Neck: Neck supple. No JVD present. No tracheal deviation present. No  thyromegaly present.  Cardiovascular: Normal rate, regular rhythm, normal heart sounds and intact distal pulses.    No murmur heard. Pulmonary/Chest: Effort normal and breath sounds normal. No respiratory distress. She has no wheezes. She has no rales. She exhibits no tenderness.       Recent incision right breast, 10:30 position, healing well. No fluid collection. No hematoma. No cosmetic defect. Nontender.  Musculoskeletal: She exhibits no edema and no tenderness.  Lymphadenopathy:    She has no cervical adenopathy.  Neurological: She is alert and oriented to person, place, and time. She exhibits normal muscle tone. Coordination normal.  Skin: Skin is warm. No rash noted. She is not diaphoretic. No erythema. No pallor.  Psychiatric: She has a normal mood and affect. Her behavior is normal. Judgment and thought content normal.    Data Reviewed I have reviewed her pathology report and her MRI.   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  Very close anterior margin, less than 1 mm  Asthma  Diabetes mellitus.  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  Techniques of surgery and risks were discussed in detail. She understands these issues. She agrees with this plan. Her questions were answered. Her son concurs.  Referred to medical oncology and radiation oncology postop.       Angelia Mould. Derrell Lolling, M.D., [facility] General and Minimally invasive Surgery Breast and Colorectal Surgery Office:   [phone] Pager:   [phone]

## 2011-09-27 ENCOUNTER — Ambulatory Visit (HOSPITAL_BASED_OUTPATIENT_CLINIC_OR_DEPARTMENT_OTHER): Payer: Medicare Other | Admitting: Certified Registered Nurse Anesthetist

## 2011-09-27 ENCOUNTER — Encounter (HOSPITAL_BASED_OUTPATIENT_CLINIC_OR_DEPARTMENT_OTHER)
Admission: RE | Disposition: A | Discharge status: Home / Self Care | Payer: Self-pay | Source: Ambulatory Visit | Attending: General Surgery

## 2011-09-27 ENCOUNTER — Telehealth (INDEPENDENT_AMBULATORY_CARE_PROVIDER_SITE_OTHER): Payer: Self-pay

## 2011-09-27 ENCOUNTER — Encounter (HOSPITAL_BASED_OUTPATIENT_CLINIC_OR_DEPARTMENT_OTHER): Payer: Self-pay | Admitting: Certified Registered Nurse Anesthetist

## 2011-09-27 ENCOUNTER — Ambulatory Visit (HOSPITAL_BASED_OUTPATIENT_CLINIC_OR_DEPARTMENT_OTHER)
Admission: RE | Admit: 2011-09-27 | Discharge: 2011-09-27 | Disposition: A | Discharge status: Home / Self Care | Payer: Medicare Other | Source: Ambulatory Visit | Attending: General Surgery | Admitting: General Surgery

## 2011-09-27 ENCOUNTER — Encounter (HOSPITAL_BASED_OUTPATIENT_CLINIC_OR_DEPARTMENT_OTHER): Payer: Self-pay | Admitting: *Deleted

## 2011-09-27 DIAGNOSIS — Z01812 Encounter for preprocedural laboratory examination: Secondary | ICD-10-CM | POA: Insufficient documentation

## 2011-09-27 DIAGNOSIS — C50919 Malignant neoplasm of unspecified site of unspecified female breast: Secondary | ICD-10-CM

## 2011-09-27 DIAGNOSIS — D0590 Unspecified type of carcinoma in situ of unspecified breast: Secondary | ICD-10-CM | POA: Insufficient documentation

## 2011-09-27 DIAGNOSIS — D0510 Intraductal carcinoma in situ of unspecified breast: Secondary | ICD-10-CM

## 2011-09-27 DIAGNOSIS — I10 Essential (primary) hypertension: Secondary | ICD-10-CM | POA: Insufficient documentation

## 2011-09-27 HISTORY — PX: BREAST SURGERY: SHX581

## 2011-09-27 SURGERY — EXCISION, LESION, BREAST
Anesthesia: General | Site: Breast | Laterality: Right | Wound class: Clean

## 2011-09-27 MED ORDER — OXYCODONE HCL 5 MG PO TABS
5.0000 mg | ORAL_TABLET | Freq: Once | ORAL | Status: AC | PRN
  Administered 2011-09-27: 5 mg via ORAL

## 2011-09-27 MED ORDER — ACETAMINOPHEN 650 MG RE SUPP: 650.0000 mg | RECTAL | Status: DC | PRN

## 2011-09-27 MED ORDER — EPHEDRINE SULFATE 50 MG/ML IJ SOLN
INTRAMUSCULAR | Status: DC | PRN
  Administered 2011-09-27: 15 mg via INTRAVENOUS

## 2011-09-27 MED ORDER — CEFAZOLIN SODIUM-DEXTROSE 2-3 GM-% IV SOLR: 2.0000 g | INTRAVENOUS | Status: DC

## 2011-09-27 MED ORDER — ACETAMINOPHEN 10 MG/ML IV SOLN
1000.0000 mg | Freq: Once | INTRAVENOUS | Status: AC
  Administered 2011-09-27: 1000 mg via INTRAVENOUS

## 2011-09-27 MED ORDER — METOCLOPRAMIDE HCL 5 MG/ML IJ SOLN: 10.0000 mg | Freq: Once | INTRAMUSCULAR | Status: DC | PRN

## 2011-09-27 MED ORDER — ACETAMINOPHEN 325 MG PO TABS: 650.0000 mg | ORAL_TABLET | ORAL | Status: DC | PRN

## 2011-09-27 MED ORDER — SODIUM CHLORIDE 0.9 % IV SOLN: 250.0000 mL | INTRAVENOUS | Status: DC | PRN

## 2011-09-27 MED ORDER — LIDOCAINE-EPINEPHRINE (PF) 1 %-1:200000 IJ SOLN
INTRAMUSCULAR | Status: DC | PRN
  Administered 2011-09-27: 10 mL

## 2011-09-27 MED ORDER — MIDAZOLAM HCL 2 MG/2ML IJ SOLN: 0.5000 mg | INTRAMUSCULAR | Status: DC | PRN

## 2011-09-27 MED ORDER — HYDROCODONE-ACETAMINOPHEN 5-325 MG PO TABS: 1.0000 | ORAL_TABLET | ORAL | Status: AC | PRN

## 2011-09-27 MED ORDER — OXYCODONE HCL 5 MG PO TABS: 5.0000 mg | ORAL_TABLET | ORAL | Status: DC | PRN

## 2011-09-27 MED ORDER — PHENYLEPHRINE HCL 10 MG/ML IJ SOLN
INTRAMUSCULAR | Status: DC | PRN
  Administered 2011-09-27: 80 ug via INTRAVENOUS

## 2011-09-27 MED ORDER — CHLORHEXIDINE GLUCONATE 4 % EX LIQD: 1.0000 "application " | Freq: Once | CUTANEOUS | Status: DC

## 2011-09-27 MED ORDER — SODIUM CHLORIDE 0.9 % IJ SOLN: 3.0000 mL | Freq: Two times a day (BID) | INTRAMUSCULAR | Status: DC

## 2011-09-27 MED ORDER — SODIUM CHLORIDE 0.9 % IV SOLN: INTRAVENOUS | Status: DC

## 2011-09-27 MED ORDER — ONDANSETRON HCL 4 MG/2ML IJ SOLN: 4.0000 mg | Freq: Four times a day (QID) | INTRAMUSCULAR | Status: DC | PRN

## 2011-09-27 MED ORDER — MORPHINE SULFATE 2 MG/ML IJ SOLN: 1.0000 mg | INTRAMUSCULAR | Status: DC | PRN

## 2011-09-27 MED ORDER — SODIUM CHLORIDE 0.9 % IJ SOLN: 3.0000 mL | INTRAMUSCULAR | Status: DC | PRN

## 2011-09-27 MED ORDER — LACTATED RINGERS IV SOLN
INTRAVENOUS | Status: DC
  Administered 2011-09-27: 11:00:00 via INTRAVENOUS

## 2011-09-27 MED ORDER — HYDROMORPHONE HCL PF 1 MG/ML IJ SOLN
0.2500 mg | INTRAMUSCULAR | Status: DC | PRN
  Administered 2011-09-27 (×3): 0.5 mg via INTRAVENOUS

## 2011-09-27 SURGICAL SUPPLY — 54 items
APPLIER CLIP 9.375 MED OPEN (MISCELLANEOUS)
BANDAGE ELASTIC 6 VELCRO ST LF (GAUZE/BANDAGES/DRESSINGS) IMPLANT
BENZOIN TINCTURE PRP APPL 2/3 (GAUZE/BANDAGES/DRESSINGS) IMPLANT
BLADE HEX COATED 2.75 (ELECTRODE) ×2 IMPLANT
BLADE SURG 15 STRL LF DISP TIS (BLADE) ×2 IMPLANT
BLADE SURG 15 STRL SS (BLADE) ×2
CANISTER SUCTION 1200CC (MISCELLANEOUS) ×2 IMPLANT
CHLORAPREP W/TINT 26ML (MISCELLANEOUS) ×2 IMPLANT
CLIP APPLIE 9.375 MED OPEN (MISCELLANEOUS) IMPLANT
CLOTH BEACON ORANGE TIMEOUT ST (SAFETY) ×2 IMPLANT
COVER MAYO STAND STRL (DRAPES) ×2 IMPLANT
COVER TABLE BACK 60X90 (DRAPES) ×2 IMPLANT
DECANTER SPIKE VIAL GLASS SM (MISCELLANEOUS) IMPLANT
DERMABOND ADVANCED (GAUZE/BANDAGES/DRESSINGS) ×1
DERMABOND ADVANCED .7 DNX12 (GAUZE/BANDAGES/DRESSINGS) ×1 IMPLANT
DRAPE LAPAROTOMY TRNSV 102X78 (DRAPE) IMPLANT
DRAPE PED LAPAROTOMY (DRAPES) ×2 IMPLANT
DRAPE UTILITY XL STRL (DRAPES) ×2 IMPLANT
ELECT REM PT RETURN 9FT ADLT (ELECTROSURGICAL) ×2
ELECTRODE REM PT RTRN 9FT ADLT (ELECTROSURGICAL) ×1 IMPLANT
GAUZE SPONGE 4X4 12PLY STRL LF (GAUZE/BANDAGES/DRESSINGS) IMPLANT
GAUZE SPONGE 4X4 16PLY XRAY LF (GAUZE/BANDAGES/DRESSINGS) IMPLANT
GLOVE BIO SURGEON STRL SZ 6.5 (GLOVE) ×2 IMPLANT
GLOVE ECLIPSE 6.5 STRL STRAW (GLOVE) ×2 IMPLANT
GLOVE EUDERMIC 7 POWDERFREE (GLOVE) ×2 IMPLANT
GOWN PREVENTION PLUS XLARGE (GOWN DISPOSABLE) ×4 IMPLANT
GOWN PREVENTION PLUS XXLARGE (GOWN DISPOSABLE) ×2 IMPLANT
KIT MARKER MARGIN INK (KITS) ×2 IMPLANT
NEEDLE HYPO 22GX1.5 SAFETY (NEEDLE) IMPLANT
NEEDLE HYPO 25X1 1.5 SAFETY (NEEDLE) ×2 IMPLANT
NS IRRIG 1000ML POUR BTL (IV SOLUTION) ×2 IMPLANT
PACK BASIN DAY SURGERY FS (CUSTOM PROCEDURE TRAY) ×2 IMPLANT
PENCIL BUTTON HOLSTER BLD 10FT (ELECTRODE) ×2 IMPLANT
SLEEVE SCD COMPRESS KNEE MED (MISCELLANEOUS) IMPLANT
SPONGE LAP 4X18 X RAY DECT (DISPOSABLE) ×2 IMPLANT
STAPLER VISISTAT 35W (STAPLE) IMPLANT
STRIP CLOSURE SKIN 1/2X4 (GAUZE/BANDAGES/DRESSINGS) IMPLANT
SUT ETHILON 4 0 PS 2 18 (SUTURE) IMPLANT
SUT MON AB 4-0 PC3 18 (SUTURE) IMPLANT
SUT SILK 2 0 SH (SUTURE) ×2 IMPLANT
SUT VIC AB 2-0 SH 27 (SUTURE)
SUT VIC AB 2-0 SH 27XBRD (SUTURE) IMPLANT
SUT VIC AB 3-0 FS2 27 (SUTURE) ×2 IMPLANT
SUT VIC AB 4-0 P-3 18XBRD (SUTURE) IMPLANT
SUT VIC AB 4-0 P3 18 (SUTURE)
SUT VICRYL 3-0 CR8 SH (SUTURE) ×2 IMPLANT
SUT VICRYL 4-0 PS2 18IN ABS (SUTURE) IMPLANT
SYR BULB 3OZ (MISCELLANEOUS) IMPLANT
SYR CONTROL 10ML LL (SYRINGE) ×2 IMPLANT
TAPE HYPAFIX 4 X10 (GAUZE/BANDAGES/DRESSINGS) IMPLANT
TOWEL OR NON WOVEN STRL DISP B (DISPOSABLE) ×2 IMPLANT
TUBE CONNECTING 20X1/4 (TUBING) ×2 IMPLANT
WATER STERILE IRR 1000ML POUR (IV SOLUTION) IMPLANT
YANKAUER SUCT BULB TIP NO VENT (SUCTIONS) ×2 IMPLANT

## 2011-09-27 NOTE — Anesthesia Preprocedure Evaluation (Signed)
Anesthesia Evaluation  Patient identified by MRN, date of birth, ID band Patient awake    Reviewed: Allergy & Precautions, H&P , NPO status , Patient's Chart, lab work & pertinent test results, reviewed documented beta blocker date and time   Airway Mallampati: II TM Distance: >3 FB Neck ROM: full    Dental   Pulmonary asthma ,          Cardiovascular hypertension, On Medications     Neuro/Psych negative neurological ROS  negative psych ROS   GI/Hepatic negative GI ROS, Neg liver ROS,   Endo/Other  negative endocrine ROS  Renal/GU negative Renal ROS  negative genitourinary   Musculoskeletal   Abdominal   Peds  Hematology negative hematology ROS (+)   Anesthesia Other Findings See surgeon's H&P   Reproductive/Obstetrics negative OB ROS                           Anesthesia Physical Anesthesia Plan  ASA: III  Anesthesia Plan: General   Post-op Pain Management:    Induction: Intravenous  Airway Management Planned: LMA  Additional Equipment:   Intra-op Plan:   Post-operative Plan: Extubation in OR  Informed Consent: I have reviewed the patients History and Physical, chart, labs and discussed the procedure including the risks, benefits and alternatives for the proposed anesthesia with the patient or authorized representative who has indicated his/her understanding and acceptance.     Plan Discussed with: CRNA and Surgeon  Anesthesia Plan Comments:         Anesthesia Quick Evaluation

## 2011-09-27 NOTE — Transfer of Care (Signed)
Immediate Anesthesia Transfer of Care Note  Patient: Nicole Holland  Procedure(s) Performed: Procedure(s) (LRB): RE-EXCISION OF BREAST LUMPECTOMY (Right)  Patient Location: PACU  Anesthesia Type: General  Level of Consciousness: awake, alert , oriented and patient cooperative  Airway & Oxygen Therapy: Patient Spontanous Breathing and Patient connected to face mask oxygen  Post-op Assessment: Report given to PACU RN and Post -op Vital signs reviewed and stable  Post vital signs: Reviewed and stable  Complications: No apparent anesthesia complications

## 2011-09-27 NOTE — Telephone Encounter (Signed)
Message copied by Joanette Gula on Fri Sep 27, 2011 12:41 PM ------      Message from: [name]      Created: Fri Sep 27, 2011 12:18 PM       Surgery performed at [facility] today...            Right partial mastectomy (reexcision for margins)                  Cindy, please refer to med-onc and rad-onc for management of DCIS.

## 2011-09-27 NOTE — Op Note (Signed)
Patient Name:           Nicole Holland   Date of Surgery:        09/27/2011  Pre op Diagnosis:      Ductal carcinoma in situ right breast  Post op Diagnosis:    same  Procedure:                 reexcision right breast lumpectomy site  Surgeon:                     Angelia Mould. Derrell Lolling, M.D., FACS  Assistant:                      none  Operative Indications:   This is a 76 year old African American female who underwent right partial mastectomy recently, upper outer quadrant, because of calcifications and atypical ductal hyperplasia seen on image guided biopsy. The final pathology report showed a low-grade ductal carcinoma in situ, receptor positive. There were actually 3 tiny foci of DCIS. One of the foci is less than 1 mm from the anterior margin. She has had an MRI shows no other abnormalities. I have discussed this with the patient in detail. We have elected to re excise this area and then refer her for radiation oncology and medical oncology consultation.  Operative Findings:         Procedure in Detail:          Following the induction of a general, LMA anesthetic the patient's right breast was prepped and draped in a sterile fashion. Intravenous antibiotics were given. Surgical time out was performed. 0.5% Marcaine with epinephrine was used as a local infiltration anesthetic. I observed the curvilinear lumpectomy scar in the upper outer quadrant of the right breast. Using a marking pen I drew a thin elliptical incision so as to completely excise this scar. The incision was then made and I dissected superiorly, medially, inferiorly and laterally down into the breast tissue for about 2-1/2 or 3 cm deep. The specimen was removed and marked with the 6 color margin Marker Kit. The specimen was sent for routine histologic exam. Hemostasis was excellent and achieved with the electrocautery. The breast tissue was closed with interrupted sutures of 3-0 Vicryl and the skin closed with a running subcuticular  suture of 4-0 Monocryl and Dermabond. The patient was taken to recovery room  in stable condition. EBL 10 cc. Complications none. Counts correct.     Angelia Mould. Derrell Lolling, M.D., FACS General and Minimally Invasive Surgery Breast and Colorectal Surgery  09/27/2011 12:10 PM

## 2011-09-27 NOTE — Anesthesia Postprocedure Evaluation (Signed)
Anesthesia Post Note  Patient: Nicole Holland  Procedure(s) Performed: Procedure(s) (LRB): RE-EXCISION OF BREAST LUMPECTOMY (Right)  Anesthesia type: General  Patient location: PACU  Post pain: Pain level controlled  Post assessment: Patient's Cardiovascular Status Stable  Last Vitals:  Filed Vitals:   09/27/11 1355  BP: 113/47  Pulse: 67  Temp:   Resp: 16    Post vital signs: Reviewed and stable  Level of consciousness: alert  Complications: No apparent anesthesia complications

## 2011-09-27 NOTE — Interval H&P Note (Signed)
History and Physical Interval Note:  09/27/2011 11:20 AM  Nicole Holland  has presented today for surgery, with the diagnosis of dcis/ breast cancer close margins   The goals of treatment and the various methods of treatment have been discussed with the patient and family. After consideration of risks, benefits and other options for treatment, the patient has consented to  Procedure(s) (LRB): RE-EXCISION OF BREAST LUMPECTOMY (Right) as a surgical intervention .  The patients' history has been reviewed and the  patient examined today, no change in status, stable for surgery.  I have reviewed the patients' chart and labs.  Questions were answered to the patient's satisfaction.     [name]

## 2011-09-27 NOTE — Discharge Instructions (Signed)
[facility] °Office Phone Number [phone] ° °BREAST BIOPSY/ PARTIAL MASTECTOMY: POST OP INSTRUCTIONS ° °Always review your discharge instruction sheet given to you by the facility where your surgery was performed. ° °IF YOU HAVE DISABILITY OR FAMILY LEAVE FORMS, YOU MUST BRING THEM TO THE OFFICE FOR PROCESSING.  DO NOT GIVE THEM TO YOUR DOCTOR. ° °1. A prescription for pain medication may be given to you upon discharge.  Take your pain medication as prescribed, if needed.  If narcotic pain medicine is not needed, then you may take acetaminophen (Tylenol) or ibuprofen (Advil) as needed. °2. Take your usually prescribed medications unless otherwise directed °3. If you need a refill on your pain medication, please contact your pharmacy.  They will contact our office to request authorization.  Prescriptions will not be filled after 5pm or on week-ends. °4. You should eat very light the first 24 hours after surgery, such as soup, crackers, pudding, etc.  Resume your normal diet the day after surgery. °5. Most patients will experience some swelling and bruising in the breast.  Ice packs and a good support bra will help.  Swelling and bruising can take several days to resolve.  °6. It is common to experience some constipation if taking pain medication after surgery.  Increasing fluid intake and taking a stool softener will usually help or prevent this problem from occurring.  A mild laxative (Milk of Magnesia or Miralax) should be taken according to package directions if there are no bowel movements after 48 hours. °7. Unless discharge instructions indicate otherwise, you may remove your bandages 24-48 hours after surgery, and you may shower at that time.  You may have steri-strips (small skin tapes) in place directly over the incision.  These strips should be left on the skin for 7-10 days.  If your surgeon used skin glue on the incision, you may shower in 24 hours.  The glue will flake off over the  next 2-3 weeks.  Any sutures or staples will be removed at the office during your follow-up visit. °8. ACTIVITIES:  You may resume regular daily activities (gradually increasing) beginning the next day.  Wearing a good support bra or sports bra minimizes pain and swelling.  You may have sexual intercourse when it is comfortable. °a. You may drive when you no longer are taking prescription pain medication, you can comfortably wear a seatbelt, and you can safely maneuver your car and apply brakes. °b. RETURN TO WORK:  ______________________________________________________________________________________ °9. You should see your doctor in the office for a follow-up appointment approximately two weeks after your surgery.  Your doctor’s nurse will typically make your follow-up appointment when she calls you with your pathology report.  Expect your pathology report 2-3 business days after your surgery.  You may call to check if you do not hear from us after three days. °10. OTHER INSTRUCTIONS: _______________________________________________________________________________________________ _____________________________________________________________________________________________________________________________________ °_____________________________________________________________________________________________________________________________________ °_____________________________________________________________________________________________________________________________________ ° °WHEN TO CALL YOUR DOCTOR: °1. Fever over 101.0 °2. Nausea and/or vomiting. °3. Extreme swelling or bruising. °4. Continued bleeding from incision. °5. Increased pain, redness, or drainage from the incision. ° °The clinic staff is available to answer your questions during regular business hours.  Please don’t hesitate to call and ask to speak to one of the nurses for clinical concerns.  If you have a medical emergency, go to the nearest  emergency room or call 911.  A surgeon from [facility] is always on call at the hospital. ° °For further questions, please visit [email or web address]  ° ° °  Post Anesthesia Home Care Instructions ° °Activity: °Get plenty of rest for the remainder of the day. A responsible adult should stay with you for 24 hours following the procedure.  °For the next 24 hours, DO NOT: °-Drive a car °-Operate machinery °-Drink alcoholic beverages °-Take any medication unless instructed by your physician °-Make any legal decisions or sign important papers. ° °Meals: °Start with liquid foods such as gelatin or soup. Progress to regular foods as tolerated. Avoid greasy, spicy, heavy foods. If nausea and/or vomiting occur, drink only clear liquids until the nausea and/or vomiting subsides. Call your physician if vomiting continues. ° °Special Instructions/Symptoms: °Your throat may feel dry or sore from the anesthesia or the breathing tube placed in your throat during surgery. If this causes discomfort, gargle with warm salt water. The discomfort should disappear within 24 hours. ° °

## 2011-09-27 NOTE — Anesthesia Procedure Notes (Signed)
Procedure Name: LMA Insertion Date/Time: 09/27/2011 11:37 AM Performed by: Talayah Picardi D Pre-anesthesia Checklist: Patient identified, Emergency Drugs available, Suction available and Patient being monitored Patient Re-evaluated:Patient Re-evaluated prior to inductionOxygen Delivery Method: Circle System Utilized Preoxygenation: Pre-oxygenation with 100% oxygen Intubation Type: IV induction Ventilation: Mask ventilation without difficulty LMA: LMA with gastric port inserted LMA Size: 4.0 Number of attempts: 1 Placement Confirmation: positive ETCO2 Tube secured with: Tape Dental Injury: Teeth and Oropharynx as per pre-operative assessment

## 2011-09-30 ENCOUNTER — Telehealth: Payer: Self-pay | Admitting: *Deleted

## 2011-09-30 NOTE — Progress Notes (Signed)
Quick Note:  Inform patient of Pathology report,. ______ 

## 2011-10-01 ENCOUNTER — Telehealth (INDEPENDENT_AMBULATORY_CARE_PROVIDER_SITE_OTHER): Payer: Self-pay | Admitting: General Surgery

## 2011-10-01 NOTE — Telephone Encounter (Signed)
Left message for pt to return my call.

## 2011-10-02 ENCOUNTER — Telehealth (INDEPENDENT_AMBULATORY_CARE_PROVIDER_SITE_OTHER): Payer: Self-pay

## 2011-10-02 NOTE — Telephone Encounter (Signed)
Pt notified of path result and f/u appt. Pt has appt with Dr Basilio Cairo next week and has msg to call [name] in med onc back for appt. Pt states she will return [name]'s call.

## 2011-10-03 ENCOUNTER — Telehealth: Payer: Self-pay | Admitting: *Deleted

## 2011-10-03 NOTE — Telephone Encounter (Signed)
Confirmed 10/15/11 appt w/ pt.  Mailed before appt letter & packet.  Took paperwork to Med Rec for chart.

## 2011-10-10 ENCOUNTER — Encounter: Payer: Self-pay | Admitting: *Deleted

## 2011-10-10 ENCOUNTER — Encounter (INDEPENDENT_AMBULATORY_CARE_PROVIDER_SITE_OTHER): Payer: Self-pay

## 2011-10-11 ENCOUNTER — Ambulatory Visit
Admission: RE | Admit: 2011-10-11 | Discharge: 2011-10-11 | Disposition: A | Discharge status: Home / Self Care | Payer: Medicare Other | Source: Ambulatory Visit | Attending: Radiation Oncology | Admitting: Radiation Oncology

## 2011-10-11 ENCOUNTER — Encounter: Payer: Self-pay | Admitting: Radiation Oncology

## 2011-10-11 VITALS — BP 153/83 | HR 89 | Temp 98.1°F | Resp 20 | Wt 201.9 lb

## 2011-10-11 DIAGNOSIS — E785 Hyperlipidemia, unspecified: Secondary | ICD-10-CM | POA: Insufficient documentation

## 2011-10-11 DIAGNOSIS — Z510 Encounter for antineoplastic radiation therapy: Secondary | ICD-10-CM | POA: Insufficient documentation

## 2011-10-11 DIAGNOSIS — I10 Essential (primary) hypertension: Secondary | ICD-10-CM | POA: Insufficient documentation

## 2011-10-11 DIAGNOSIS — D0510 Intraductal carcinoma in situ of unspecified breast: Secondary | ICD-10-CM

## 2011-10-11 DIAGNOSIS — Z79899 Other long term (current) drug therapy: Secondary | ICD-10-CM | POA: Insufficient documentation

## 2011-10-11 DIAGNOSIS — C50411 Malignant neoplasm of upper-outer quadrant of right female breast: Secondary | ICD-10-CM | POA: Insufficient documentation

## 2011-10-11 DIAGNOSIS — D0590 Unspecified type of carcinoma in situ of unspecified breast: Secondary | ICD-10-CM | POA: Insufficient documentation

## 2011-10-11 DIAGNOSIS — C50419 Malignant neoplasm of upper-outer quadrant of unspecified female breast: Secondary | ICD-10-CM

## 2011-10-11 HISTORY — DX: Malignant neoplasm of unspecified site of unspecified female breast: C50.919

## 2011-10-11 NOTE — Progress Notes (Signed)
Encounter addended by: Odalis Jordan Mintz Kaysin Brock, RN on: 10/11/2011  4:58 PM<BR>     Documentation filed: Charges VN

## 2011-10-11 NOTE — Progress Notes (Signed)
Please see the Nurse Progress Note in the MD Initial Consult Encounter for this patient. 

## 2011-10-11 NOTE — Progress Notes (Signed)
New consult  Right Breast  Ca Lumpectomy=08/02/11= 09/27/11= re-excision  Married, G5,P4, 4 living, Father had bronchogenic ca , Sister lung ca ? Smoker,brother unknown ca Alert oriented x3, steady gait, incision well healed   Allergies: PCn, Iodiine-itching aqle over

## 2011-10-11 NOTE — Progress Notes (Signed)
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 female who reports that she has had breast biopsies in the 1990s as well as 2001. She reports that both were noncancerous. The patient was found to have mildly suspicious right breast calcifications on screening mammogram from 05/15/2011. Core needle biopsy was pursued on 05/23/2011 revealing atypical ductal hyperplasia. Dr. Derrell Lolling decided to perform a lumpectomy on 08/02/2011. This revealed low grade DCIS with 3 microscopic foci in the specimen. The nearest margin was less than 0.1 cm. There is no necrosis. The DCIS was 65% ER positive; 99% PR positive. Postoperatively, a bilateral breast MRI on 08/17/2011 demonstrated post surgical changes but no findings were worrisome for malignancy. Reexcision was performed for the close margin on 09/27/2011. No residual malignancy was appreciated. The patient is recovering well from surgery. She sees medical oncology in a few days. She is fairly active with her senior center. She ambulates independently. She denies any history of osteoporosis stroke or heart attack. She reports that her glucose has been elevated but controlled with diet and exercise. She does have osteoarthritis as well as controlled hyperlipidemia; she also has hypertension.  PREVIOUS RADIATION THERAPY: No  PAST MEDICAL HISTORY:  has a past medical history of Hypertension; Hyperlipidemia; Arthritis; Asthma; and Breast cancer (08/02/11).    PAST SURGICAL HISTORY: Past Surgical History  Procedure Date  . Breast surgery 02/01/2000    right breast subareolar core biopsy  . Cesarean section   . Tonsillectomy   .  Breast lumpectomy 08/02/11    Lumpectomy right Breast; Low Grade Ductal Carcinoma In Situ with Associated Calcifications, ER 65%, PR 99%  . Breast surgery 09/27/11    Right Breast; Excision of  Anterior Margin- Benign Breast Parenchyma, Negative for In situ and Invasive Cancer, Surgical Margins Negative, Benign skin, Negative for Malignancy  . Abdominal hysterectomy 1994    partial ovaries intact   Gynecologic history: the patient is G5 P4. She denies any history of hormonal therapy  FAMILY HISTORY:  no history of breast or ovarian cancer. her parents lived to their mid 16s. She has a sister living in her 85s.  SOCIAL HISTORY:  reports that she has never smoked. She has never used smokeless tobacco. She reports that she does not drink alcohol or use illicit drugs.  ALLERGIES: Iodides and Penicillins  MEDICATIONS:  Current Outpatient Prescriptions  Medication Sig Dispense Refill  . albuterol (PROAIR HFA) 108 (90 BASE) MCG/ACT inhaler Inhale 2 puffs into the lungs every 6 (six) hours as needed.        . budesonide-formoterol (SYMBICORT) 160-4.5 MCG/ACT inhaler Inhale 2 puffs into the lungs 2 (two) times daily.        . Calcium Carbonate-Vit D-Min (CALCIUM 1200 PO) Take by mouth.        . Cholecalciferol (VITAMIN D) 1000 UNITS capsule Take 1,000 Units by mouth daily.        Marland Kitchen diltiazem (DILACOR XR) 240 MG 24 hr capsule Take 240 mg by mouth daily.        Marland Kitchen latanoprost (XALATAN) 0.005 % ophthalmic solution Place 1 drop into both eyes at bedtime.        Marland Kitchen loratadine (CLARITIN) 10 MG tablet  Take 10 mg by mouth daily.        . mometasone (NASONEX) 50 MCG/ACT nasal spray Place 2 sprays into the nose daily.        . Multiple Vitamins-Minerals (CENTRUM SILVER PO) Take by mouth.        . NON FORMULARY as needed. Taking "Patalax" 0.2% eye drops into both eyes as needed.      . NON FORMULARY 5 mLs as needed. Patient using OTC Eye Itch Relief from [facility] - Ketotifen fumarate eye solution      .  olmesartan-hydrochlorothiazide (BENICAR HCT) 40-25 MG per tablet Take 1 tablet by mouth daily.       . Omega-3 Fatty Acids (FISH OIL) 1200 MG CAPS Take by mouth.        . Polyethylene Glycol 400 (BLINK TEARS) 0.25 % SOLN Apply to eye.        . simvastatin (ZOCOR) 20 MG tablet Take 20 mg by mouth at bedtime.        . valsartan-hydrochlorothiazide (DIOVAN-HCT) 160-25 MG per tablet Take 1 tablet by mouth daily.      . vitamin C (ASCORBIC ACID) 500 MG tablet Take 500 mg by mouth daily.          REVIEW OF SYSTEMS: Review of systems is notable for that described above.  PHYSICAL EXAM:  weight is 201 lb 14.4 oz (91.581 kg). Her oral temperature is 98.1 F (36.7 C). Her blood pressure is 153/83 and her pulse is 89. Her respiration is 20.   General: Alert and oriented, in no acute distress HEENT: Head is normocephalic. Pupils are equally round and reactive to light. Extraocular movements are intact. Oropharynx is clear. Neck: Neck is supple, no palpable cervical or supraclavicular lymphadenopathy. Heart: Regular in rate and rhythm with an audible systolic murmur most notable in the aortic and pulmonary regions. It radiates throughout the precordium.  Chest: Clear to auscultation bilaterally, with no rhonchi, wheezes, or rales. Abdomen: Soft, nontender, nondistended, with no rigidity or guarding. Extremities: No cyanosis; she has lower extremity lymph edema in her ankles, greater on the left than the right. No lymphedema in her arms Lymphatics: No concerning lymphadenopathy. Skin: No concerning lesions. Musculoskeletal: symmetric strength and muscle tone throughout. Neurologic: Cranial nerves II through XII are grossly intact. No obvious focalities. Speech is fluent. Coordination is intact. Psychiatric: Judgment and insight are intact. Affect is appropriate. Breasts: She has a healed lumpectomy scar in the upper outer quadrant of the right breast. There are no palpable lesions of concern in either  breast nor any palpable axillary lymphadenopathy bilaterally.    LABORATORY DATA:  Lab Results  Component Value Date   HGB 11.5* 09/27/2011   Lab Results  Component Value Date   NA 136 09/25/2011   K 3.8 09/25/2011   CL 99 09/25/2011   CO2 26 09/25/2011   No results found for this basename: ALT, AST, GGT, ALKPHOS, BILITOT     PATHOLOGY: As above   RADIOLOGY: As above    IMPRESSION/PLAN: Very pleasant 76 year old woman, ECoG performance status of 1, with DCIS of the right breast. Overall she has a good prognosis. She did have multifocal disease with a close margin. The margin appears to be cleared by surgery.   I explained to the patient and her son that based on her overall health and family history, she has a good life expectancy and I think that she would benefit from radiotherapy to the right breast for local control. We spoke  about the risks benefits and side effects of radiotherapy in detail. I will plan on treating her to 50 Gray in 25 fractions. I don't think that hyperfractionation would be ideal based on her body habitus. I do not think a boost will be necessary.    After our discussion, the patient favors proceeding with treatment. The consent form has been signed and placed in her chart. She'll be seeing medical oncology next week to discuss the systemic therapy is recommended. I plan on simulating her next Wednesday or Friday starting treatment on the week thereafter.

## 2011-10-14 ENCOUNTER — Encounter (INDEPENDENT_AMBULATORY_CARE_PROVIDER_SITE_OTHER): Payer: Medicare Other | Admitting: General Surgery

## 2011-10-14 ENCOUNTER — Ambulatory Visit (HOSPITAL_BASED_OUTPATIENT_CLINIC_OR_DEPARTMENT_OTHER): Payer: Medicare Other | Admitting: Oncology

## 2011-10-14 ENCOUNTER — Telehealth: Payer: Self-pay | Admitting: *Deleted

## 2011-10-14 ENCOUNTER — Other Ambulatory Visit: Payer: Medicare Other | Admitting: Lab

## 2011-10-14 ENCOUNTER — Ambulatory Visit: Payer: Medicare Other

## 2011-10-14 VITALS — BP 140/83 | HR 85 | Temp 98.3°F | Ht 59.5 in | Wt 198.7 lb

## 2011-10-14 DIAGNOSIS — C50919 Malignant neoplasm of unspecified site of unspecified female breast: Secondary | ICD-10-CM

## 2011-10-14 NOTE — Progress Notes (Signed)
Referral MD  [phone]    Reason for Referral: History of DCIS   Chief Complaint  Patient presents with  . Breast Cancer  :   HPI: Nicole Holland here with her son Nicole Holland for a discussion regarding her recent diagnosis of DCIS . This patient has undergone annual screening mammography , mammogram performed in November showed  pleomorphic calcifications right breast 12:00 position. Biopsy performed 05/23/2011 showed atypical ductal hyperplasia and fibroadenomas with calcifications. It was recommended that she have an excision of this area. A preoperative MRI scan was performed the patient underwent a lumpectomy on 08/02/2011. 3 microscopic foci of DCIS were seen measuring 0.3 0.2 1.1 cm. DCIS was seen less than 0.1 cm from anterior margin. He fibroadenomas also present. The DCIS was ER and PR positive at 6599% respectively.  Cooperative with the patient underwent an MRI scan to rule out residual foci of DCIS. Postoperative changes are seen in the right breast and no other evidence of malignancy was seen. This was performed on 08/17/2011.  The patient underwent further reexcision on 09/27/2011 and no other abnormal cells either invasive or noninvasive were seen.  Past Medical History  Diagnosis Date  . Hypertension   . Hyperlipidemia   . Arthritis   . Asthma   . Breast cancer 08/02/11    right low grade ductal ca in situ upper outer quad   :  Past Surgical History  Procedure Date  . Breast surgery 02/01/2000    right breast subareolar core biopsy  . Cesarean section   . Tonsillectomy   . Breast lumpectomy 08/02/11    Lumpectomy right Breast; Low Grade Ductal Carcinoma In Situ with Associated Calcifications, ER 65%, PR 99%  . Breast surgery 09/27/11    Right Breast; Excision of  Anterior Margin- Benign Breast Parenchyma, Negative for In situ and Invasive Cancer, Surgical Margins Negative, Benign skin, Negative for Malignancy  . Abdominal hysterectomy 1994    partial ovaries intact    :  Current outpatient prescriptions:albuterol (PROAIR HFA) 108 (90 BASE) MCG/ACT inhaler, Inhale 2 puffs into the lungs every 6 (six) hours as needed.  , Disp: , Rfl: ;  budesonide-formoterol (SYMBICORT) 160-4.5 MCG/ACT inhaler, Inhale 2 puffs into the lungs 2 (two) times daily.  , Disp: , Rfl: ;  Calcium Carbonate-Vit D-Min (CALCIUM 1200 PO), Take by mouth.  , Disp: , Rfl:  Cholecalciferol (VITAMIN D) 1000 UNITS capsule, Take 1,000 Units by mouth daily.  , Disp: , Rfl: ;  diltiazem (DILACOR XR) 240 MG 24 hr capsule, Take 240 mg by mouth daily.  , Disp: , Rfl: ;  latanoprost (XALATAN) 0.005 % ophthalmic solution, Place 1 drop into both eyes at bedtime.  , Disp: , Rfl: ;  loratadine (CLARITIN) 10 MG tablet, Take 10 mg by mouth daily.  , Disp: , Rfl:  mometasone (NASONEX) 50 MCG/ACT nasal spray, Place 2 sprays into the nose daily.  , Disp: , Rfl: ;  Multiple Vitamins-Minerals (CENTRUM SILVER PO), Take by mouth.  , Disp: , Rfl: ;  NON FORMULARY, as needed. Taking "Patalax" 0.2% eye drops into both eyes as needed., Disp: , Rfl: ;  NON FORMULARY, 5 mLs as needed. Patient using OTC Eye Itch Relief from [facility] - Ketotifen fumarate eye solution, Disp: , Rfl:  olmesartan-hydrochlorothiazide (BENICAR HCT) 40-25 MG per tablet, Take 1 tablet by mouth daily. , Disp: , Rfl: ;  Omega-3 Fatty Acids (FISH OIL) 1200 MG CAPS, Take by mouth.  , Disp: , Rfl: ;  Polyethylene  Glycol 400 (BLINK TEARS) 0.25 % SOLN, Apply to eye.  , Disp: , Rfl: ;  simvastatin (ZOCOR) 20 MG tablet, Take 20 mg by mouth at bedtime.  , Disp: , Rfl:  valsartan-hydrochlorothiazide (DIOVAN-HCT) 160-25 MG per tablet, Take 1 tablet by mouth daily., Disp: , Rfl: ;  vitamin C (ASCORBIC ACID) 500 MG tablet, Take 500 mg by mouth daily.  , Disp: , Rfl: :    :  Allergies  Allergen Reactions  . Iodides Itching    All over the body.  . Penicillins Itching    All over the body.  :  No family history on file.:  History   Social History  . Marital  Status: Lives alone, married x 87    Spouse Name: N/A    Number of Children: 4 children; 1- [place], works @ [employer], 3 others ; 6 grandchildren , 1 greatgrandchild.  . Years of Education: N/A   Occupational History  . Not on file.     Social History Main Topics  . Smoking status: Never Smoker   . Smokeless tobacco: Never Used  . Alcohol Use: No  . Drug Use: No  . Sexually Active: Not on file     mensus age 65,G5P4, 1st preg age Nicole, b.c short time,couldn't tolerate   Other Topics Concern  . Not on file   Social History Narrative  . Moved to [place] in 2006, to live with son., from [place]  :  A comprehensive review of systems was negative.  Exam:  General appearance: alert, cooperative and appears stated age Eyes: conjunctivae/corneas clear. PERRL, EOM's intact. Fundi benign. Throat: lips, mucosa, and tongue normal; teeth and gums normal Resp: clear to auscultation bilaterally and normal percussion bilaterally Breasts: normal appearance, no masses or tenderness GI: soft, non-tender; bowel sounds normal; no masses,  no organomegaly   Extremities: extremities normal, atraumatic, no cyanosis or edema Lymph nodes: Cervical, supraclavicular, and axillary nodes normal. Neurologic: Grossly normal  No results found for this basename: WBC:2,HGB:2,HCT:2,PLT:2 in the last 72 hours No results found for this basename: NA:2,K:2,CL:2,CO2:2,GLUCOSE:2,BUN:2,CREATININE:2,CALCIUM:2 in the last 72 hours  Blood smear review: n/a  Pathology:as above  No results found.  Assessment and Plan: Pleasant 76 year old Holland with history of previous biopsies for benign breast disease. She is present with a number of foci of ER/PR positive DCIS. She is having successfully excised with clear margins. She was seen in the radiation oncologist and will begin radiation in approximately a week. I did bring up the issue of using tamoxifen as a chemotherapy preventive agent. I discussed side effects. She is  interested in pursuing this. She is status post hysterectomy about this and has not had any family or personal history of blood clots. Does have a history of some obesity, hypertension and hyperlipidemia which would make her at increased risk for blood clots so I would likely recommend she take low-dose aspirin. I also discussed with her the B. 43 clinical trial looking at 2 doses of Herceptin given prior to starting radiation therapy. I will have one of the research nurses contact her to discuss this with her.  I will plan to see her in followup in approximately 2-3 months to once again discussed the use of tamoxifen therapy with her.   Pierce Crane M.D. FRCPC

## 2011-10-14 NOTE — Telephone Encounter (Signed)
per verbal orders moved patient to 10-14-2011

## 2011-10-15 ENCOUNTER — Ambulatory Visit: Payer: Medicare Other

## 2011-10-15 ENCOUNTER — Encounter: Payer: Self-pay | Admitting: *Deleted

## 2011-10-15 ENCOUNTER — Ambulatory Visit: Payer: Medicare Other | Admitting: Oncology

## 2011-10-15 ENCOUNTER — Other Ambulatory Visit: Payer: Medicare Other | Admitting: Lab

## 2011-10-17 ENCOUNTER — Encounter: Payer: Self-pay | Admitting: *Deleted

## 2011-10-17 NOTE — Progress Notes (Signed)
Mailed after appt letter to pt. 

## 2011-10-18 ENCOUNTER — Ambulatory Visit
Admission: RE | Admit: 2011-10-18 | Discharge: 2011-10-18 | Disposition: A | Discharge status: Home / Self Care | Payer: Medicare Other | Source: Ambulatory Visit | Attending: Radiation Oncology | Admitting: Radiation Oncology

## 2011-10-18 DIAGNOSIS — C50419 Malignant neoplasm of upper-outer quadrant of unspecified female breast: Secondary | ICD-10-CM

## 2011-10-18 NOTE — Progress Notes (Signed)
SIMULATION TREATMENT PLANNING NOTE: RIGHT BREAST CANCER  The patient is status post lumpectomy. She will receive whole breast radiotherapy. The patient was laid in the supine position on the treatment table with her right arm over her head  and her left arm at her side. Her head was in an Accuform device. I placed adhesive wiring over her lumpectomy scar and around the borders of her right breast tissue .  Rolled up gauze was placed under her inframammary fold and in the skin fold of her right axiilla. High-resolution CT axial imaging was obtained of the patient's chest. An isocenter was placed in her right breast.  Skin markings were made and she tolerated the procedure well without any complications.  Treatment planning note: the patient will be treated with opposed tangential fields using MLCs for custom blocks. I plan to prescribe 50 Gray in 25 fractions to the right breast. I will not deliver a boost.

## 2011-10-18 NOTE — Progress Notes (Signed)
Met with patient to discuss RO billing.  Patient had no concerns today. 

## 2011-10-25 ENCOUNTER — Ambulatory Visit
Admission: RE | Admit: 2011-10-25 | Discharge: 2011-10-25 | Disposition: A | Discharge status: Home / Self Care | Payer: Medicare Other | Source: Ambulatory Visit | Attending: Radiation Oncology | Admitting: Radiation Oncology

## 2011-10-25 DIAGNOSIS — C50419 Malignant neoplasm of upper-outer quadrant of unspecified female breast: Secondary | ICD-10-CM

## 2011-10-25 NOTE — Progress Notes (Signed)
NARRATIVE: The patient was laid in the correct position on the treatment table for simulation verification. Portal imaging was obtained and I verified the fields and MLCs for her right breast treatments to be accurate. The patient tolerated the procedure well.

## 2011-10-28 ENCOUNTER — Encounter: Payer: Self-pay | Admitting: Radiation Oncology

## 2011-10-28 ENCOUNTER — Ambulatory Visit
Admission: RE | Admit: 2011-10-28 | Discharge: 2011-10-28 | Disposition: A | Discharge status: Home / Self Care | Payer: Medicare Other | Source: Ambulatory Visit | Attending: Radiation Oncology | Admitting: Radiation Oncology

## 2011-10-28 VITALS — BP 146/78 | HR 74 | Temp 98.0°F | Wt 200.0 lb

## 2011-10-28 DIAGNOSIS — C50419 Malignant neoplasm of upper-outer quadrant of unspecified female breast: Secondary | ICD-10-CM

## 2011-10-28 NOTE — Progress Notes (Signed)
   Weekly Management Note: R Breast Cancer Current Dose:  200 cGy  Projected Dose:  5000 cGy   Narrative:  The patient presents for routine under treatment assessment.  CBCT/MVCT images/Port film x-rays were reviewed.  The chart was checked. No complaints.   Physical Findings: Weight: 200 lb (90.719 kg). Skin / breast exam deferred.  Impression:  The patient is tolerating radiotherapy.  Plan:  Continue radiotherapy as planned.

## 2011-10-28 NOTE — Progress Notes (Signed)
First Treatment today.  No voiced complaints.  Post simulation Education completed.  Reviewed skin care management, fatigue management, daily check-up, Weekly Dr.  Visit on Monday, process for daily radiation and given this nurse's business card to call for any questions and concerns.

## 2011-10-29 ENCOUNTER — Ambulatory Visit
Admission: RE | Admit: 2011-10-29 | Discharge: 2011-10-29 | Disposition: A | Discharge status: Home / Self Care | Payer: Medicare Other | Source: Ambulatory Visit | Attending: Radiation Oncology | Admitting: Radiation Oncology

## 2011-10-30 ENCOUNTER — Ambulatory Visit
Admission: RE | Admit: 2011-10-30 | Discharge: 2011-10-30 | Disposition: A | Discharge status: Home / Self Care | Payer: Medicare Other | Source: Ambulatory Visit | Attending: Radiation Oncology | Admitting: Radiation Oncology

## 2011-10-31 ENCOUNTER — Other Ambulatory Visit: Payer: Medicare Other | Admitting: Lab

## 2011-10-31 ENCOUNTER — Ambulatory Visit
Admission: RE | Admit: 2011-10-31 | Discharge: 2011-10-31 | Disposition: A | Discharge status: Home / Self Care | Payer: Medicare Other | Source: Ambulatory Visit | Attending: Radiation Oncology | Admitting: Radiation Oncology

## 2011-10-31 ENCOUNTER — Ambulatory Visit: Payer: Medicare Other

## 2011-10-31 ENCOUNTER — Ambulatory Visit: Payer: Medicare Other | Admitting: Oncology

## 2011-11-01 ENCOUNTER — Ambulatory Visit
Admission: RE | Admit: 2011-11-01 | Discharge: 2011-11-01 | Disposition: A | Discharge status: Home / Self Care | Payer: Medicare Other | Source: Ambulatory Visit | Attending: Radiation Oncology | Admitting: Radiation Oncology

## 2011-11-04 ENCOUNTER — Ambulatory Visit
Admission: RE | Admit: 2011-11-04 | Discharge: 2011-11-04 | Disposition: A | Discharge status: Home / Self Care | Payer: Medicare Other | Source: Ambulatory Visit | Attending: Radiation Oncology | Admitting: Radiation Oncology

## 2011-11-04 VITALS — BP 152/80 | HR 81 | Wt 201.0 lb

## 2011-11-04 DIAGNOSIS — C50419 Malignant neoplasm of upper-outer quadrant of unspecified female breast: Secondary | ICD-10-CM

## 2011-11-04 NOTE — Progress Notes (Signed)
Here for routine md visit for right breast radiation treatment.Denies pain. No skin changes. Post sim education complete. Denies fatigue."I feel good".

## 2011-11-04 NOTE — Progress Notes (Signed)
Weekly Management Note:  Site:R Breast Current Dose:  1200  cGy Projected Dose: 5000  cGy  Narrative: The patient is seen today for routine under treatment assessment. CBCT/MVCT images/port films were reviewed. The chart was reviewed.   She is without complaints today. She uses Radioplex gel when necessary.  Physical Examination:  Filed Vitals:   11/04/11 1508  BP: 152/80  Pulse: 81  .  Weight: 201 lb (91.173 kg). No significant skin changes.  Impression: Tolerating radiation therapy well.  Plan: Continue radiation therapy as planned.

## 2011-11-05 ENCOUNTER — Ambulatory Visit
Admission: RE | Admit: 2011-11-05 | Discharge: 2011-11-05 | Disposition: A | Discharge status: Home / Self Care | Payer: Medicare Other | Source: Ambulatory Visit | Attending: Radiation Oncology | Admitting: Radiation Oncology

## 2011-11-06 ENCOUNTER — Ambulatory Visit
Admission: RE | Admit: 2011-11-06 | Discharge: 2011-11-06 | Disposition: A | Discharge status: Home / Self Care | Payer: Medicare Other | Source: Ambulatory Visit | Attending: Radiation Oncology | Admitting: Radiation Oncology

## 2011-11-07 ENCOUNTER — Ambulatory Visit (INDEPENDENT_AMBULATORY_CARE_PROVIDER_SITE_OTHER): Payer: Medicare Other | Admitting: General Surgery

## 2011-11-07 ENCOUNTER — Ambulatory Visit
Admission: RE | Admit: 2011-11-07 | Discharge: 2011-11-07 | Disposition: A | Discharge status: Home / Self Care | Payer: Medicare Other | Source: Ambulatory Visit | Attending: Radiation Oncology | Admitting: Radiation Oncology

## 2011-11-07 ENCOUNTER — Encounter (INDEPENDENT_AMBULATORY_CARE_PROVIDER_SITE_OTHER): Payer: Self-pay | Admitting: General Surgery

## 2011-11-07 VITALS — BP 118/78 | HR 80 | Temp 97.6°F | Resp 14 | Ht <= 58 in | Wt 200.0 lb

## 2011-11-07 DIAGNOSIS — D0510 Intraductal carcinoma in situ of unspecified breast: Secondary | ICD-10-CM

## 2011-11-07 DIAGNOSIS — D0590 Unspecified type of carcinoma in situ of unspecified breast: Secondary | ICD-10-CM

## 2011-11-07 NOTE — Patient Instructions (Signed)
Your right breast lumpectomy incision has healed normally. There is no sign of any wound healing complications.  I agree with radiation therapy and the tamoxifen medication.  Return to see Dr. Derrell Lolling in 3 months.

## 2011-11-07 NOTE — Progress Notes (Signed)
Subjective:     Patient ID: LEN KLUVER, female   DOB: 1935/07/21, 76 y.o.   MRN: [id number]  HPI  This patient returns for postop followup regarding her right breast cancer.  She underwent right partial mastectomy on Feb.15 2012 for calcifications in the upper outer quadrant of the right breast. Image guided biopsy showed atypical ductal hyperplasia. This showed low-grade ductal carcinoma in situ which was receptor positive. We had a positive margin. On April 12 she underwent reexcision and there was no residual disease.  She has healed her wounds well. She is in her second week of adjuvant whole breast radiation therapy and is tolerating that well. She has seen Dr. Pierce Crane who has recommended that she start tamoxifen after completion of radiation therapy. I have supported that decision with her.  She has no real complaints about her breast. She denies arm pain or arm swelling. She denies breast pain. Review of Systems     Objective:   Physical Exam Patient looks well. No distress.  Right breast reveals a transverse incision in the upper outer quadrant has healed normally. No hematoma. No seroma. No infection. Nontender. Cosmesis is good with good symmetry and a minimal volume change and symmetrical nipple projection which is ptotic.    Assessment:     DCIS right breast, upper outer quadrant, low grade, receptor positive.  Doing well following right breast biopsy and subsequently excision of margins.    Plan:     Continue adjuvant whole breast radiation therapy, to be followed by adjuvant tamoxifen for 5 years.  Return to see me in 3 months.  Will plan followup mammograms annually.   Angelia Mould. Derrell Lolling, M.D., [facility] General and Minimally invasive Surgery Breast and Colorectal Surgery Office:   [phone] Pager:   [phone]

## 2011-11-08 ENCOUNTER — Ambulatory Visit
Admission: RE | Admit: 2011-11-08 | Discharge: 2011-11-08 | Disposition: A | Discharge status: Home / Self Care | Payer: Medicare Other | Source: Ambulatory Visit | Attending: Radiation Oncology | Admitting: Radiation Oncology

## 2011-11-12 ENCOUNTER — Ambulatory Visit
Admission: RE | Admit: 2011-11-12 | Discharge: 2011-11-12 | Disposition: A | Discharge status: Home / Self Care | Payer: Medicare Other | Source: Ambulatory Visit | Attending: Radiation Oncology | Admitting: Radiation Oncology

## 2011-11-13 ENCOUNTER — Ambulatory Visit
Admission: RE | Admit: 2011-11-13 | Discharge: 2011-11-13 | Disposition: A | Discharge status: Home / Self Care | Payer: Medicare Other | Source: Ambulatory Visit | Attending: Radiation Oncology | Admitting: Radiation Oncology

## 2011-11-13 ENCOUNTER — Encounter: Payer: Self-pay | Admitting: Radiation Oncology

## 2011-11-13 VITALS — BP 153/88 | HR 81 | Wt 202.2 lb

## 2011-11-13 DIAGNOSIS — C50919 Malignant neoplasm of unspecified site of unspecified female breast: Secondary | ICD-10-CM

## 2011-11-13 DIAGNOSIS — C50419 Malignant neoplasm of upper-outer quadrant of unspecified female breast: Secondary | ICD-10-CM

## 2011-11-13 NOTE — Progress Notes (Signed)
12 fractions to right breast. No complaints of pain.  Hyperpigmentation noted.  Skin dry and intact.  Denies any fatigue.

## 2011-11-13 NOTE — Progress Notes (Signed)
   Weekly Management Note, right breast cancer Current Dose:   2400 cGy  Projected Dose:  5000 cGy   Narrative:  The patient presents for routine under treatment assessment.  CBCT/MVCT images/Port film x-rays were reviewed.  The chart was checked. She is doing well with no complaints.  Physical Findings:  weight is 202 lb 3.2 oz (91.717 kg). Her blood pressure is 153/88 and her pulse is 81.  she shows hyperpigmentation over her right breast, skin is intact.  Impression:  The patient is tolerating radiotherapy.  Plan:  Continue radiotherapy as planned.

## 2011-11-14 ENCOUNTER — Ambulatory Visit
Admission: RE | Admit: 2011-11-14 | Discharge: 2011-11-14 | Disposition: A | Discharge status: Home / Self Care | Payer: Medicare Other | Source: Ambulatory Visit | Attending: Radiation Oncology | Admitting: Radiation Oncology

## 2011-11-15 ENCOUNTER — Ambulatory Visit
Admission: RE | Admit: 2011-11-15 | Discharge: 2011-11-15 | Disposition: A | Discharge status: Home / Self Care | Payer: Medicare Other | Source: Ambulatory Visit | Attending: Radiation Oncology | Admitting: Radiation Oncology

## 2011-11-18 ENCOUNTER — Encounter: Payer: Self-pay | Admitting: Radiation Oncology

## 2011-11-18 ENCOUNTER — Ambulatory Visit
Admission: RE | Admit: 2011-11-18 | Discharge: 2011-11-18 | Disposition: A | Discharge status: Home / Self Care | Payer: Medicare Other | Source: Ambulatory Visit | Attending: Radiation Oncology | Admitting: Radiation Oncology

## 2011-11-18 VITALS — BP 143/85 | HR 81 | Temp 97.3°F | Resp 18 | Wt 201.1 lb

## 2011-11-18 DIAGNOSIS — C50419 Malignant neoplasm of upper-outer quadrant of unspecified female breast: Secondary | ICD-10-CM

## 2011-11-18 NOTE — Progress Notes (Signed)
Patient presents to the clinic today unaccompanied for an under treat visit with Dr. Basilio Cairo. Patient is alert and oriented to person, place, and time. No distress noted. Steady gait noted. Pleasant affect noted. Patient denies breast pain at this time. Only faint hyperpigmentation of the right/treated breast noted without desquamation. Patient reports using Radiaplex bid and Alra as directed. Patient has no complaints at this time. Reported all findings to Dr. Basilio Cairo.

## 2011-11-18 NOTE — Progress Notes (Signed)
   Weekly Management Note, right breast cancer Current Dose:   3000cGy  Projected Dose: 5000 cGy   Narrative:  The patient presents for routine under treatment assessment.  CBCT/MVCT images/Port film x-rays were reviewed.  The chart was checked. No complaints today.  Physical Findings:  weight is 201 lb 1.6 oz (91.218 kg). Her oral temperature is 97.3 F (36.3 C). Her blood pressure is 143/85 and her pulse is 81. Her respiration is 18.  she demonstrates modest hyperpigmentation throughout the right breast.  Impression:  The patient is tolerating radiotherapy.  Plan:  Continue radiotherapy as planned.

## 2011-11-19 ENCOUNTER — Ambulatory Visit
Admission: RE | Admit: 2011-11-19 | Discharge: 2011-11-19 | Disposition: A | Discharge status: Home / Self Care | Payer: Medicare Other | Source: Ambulatory Visit | Attending: Radiation Oncology | Admitting: Radiation Oncology

## 2011-11-20 ENCOUNTER — Ambulatory Visit
Admission: RE | Admit: 2011-11-20 | Discharge: 2011-11-20 | Disposition: A | Discharge status: Home / Self Care | Payer: Medicare Other | Source: Ambulatory Visit | Attending: Radiation Oncology | Admitting: Radiation Oncology

## 2011-11-21 ENCOUNTER — Ambulatory Visit
Admission: RE | Admit: 2011-11-21 | Discharge: 2011-11-21 | Disposition: A | Discharge status: Home / Self Care | Payer: Medicare Other | Source: Ambulatory Visit | Attending: Radiation Oncology | Admitting: Radiation Oncology

## 2011-11-22 ENCOUNTER — Ambulatory Visit
Admission: RE | Admit: 2011-11-22 | Discharge: 2011-11-22 | Disposition: A | Discharge status: Home / Self Care | Payer: Medicare Other | Source: Ambulatory Visit | Attending: Radiation Oncology | Admitting: Radiation Oncology

## 2011-11-25 ENCOUNTER — Ambulatory Visit
Admission: RE | Admit: 2011-11-25 | Discharge: 2011-11-25 | Disposition: A | Discharge status: Home / Self Care | Payer: Medicare Other | Source: Ambulatory Visit | Attending: Radiation Oncology | Admitting: Radiation Oncology

## 2011-11-25 ENCOUNTER — Encounter: Payer: Self-pay | Admitting: Radiation Oncology

## 2011-11-25 VITALS — BP 141/81 | HR 83 | Wt 203.4 lb

## 2011-11-25 DIAGNOSIS — C50419 Malignant neoplasm of upper-outer quadrant of unspecified female breast: Secondary | ICD-10-CM

## 2011-11-25 NOTE — Progress Notes (Signed)
Pt exercising regularly, denies fatigue, loss of appetite, pain. She does report "skin tenderness" of right breast. Applying Radiaplex to right breast.

## 2011-11-25 NOTE — Progress Notes (Signed)
   Weekly Management Note: right breast DCIS Current Dose:   4000cGy  Projected Dose:  5000cGy   Narrative:  The patient presents for routine under treatment assessment.  CBCT/MVCT images/Port film x-rays were reviewed.  The chart was checked. She is doing well.  Has some skin irritation, especially in axillary region.  Physical Findings:  weight is 203 lb 6.4 oz (92.262 kg). Her blood pressure is 141/81 and her pulse is 83.  notable for hyperpigmentation in the right axillary region as well as the right breast.  Impression:  The patient is tolerating radiotherapy.  Plan:  Continue radiotherapy as planned. Continue radiaplex. One-month followup scheduled for the patient; next week she'll see my partner as I will be covering clinic in [place]  ________________________________   Lonie Peak, M.D.

## 2011-11-26 ENCOUNTER — Ambulatory Visit
Admission: RE | Admit: 2011-11-26 | Discharge: 2011-11-26 | Disposition: A | Discharge status: Home / Self Care | Payer: Medicare Other | Source: Ambulatory Visit | Attending: Radiation Oncology | Admitting: Radiation Oncology

## 2011-11-27 ENCOUNTER — Ambulatory Visit
Admission: RE | Admit: 2011-11-27 | Discharge: 2011-11-27 | Disposition: A | Discharge status: Home / Self Care | Payer: Medicare Other | Source: Ambulatory Visit | Attending: Radiation Oncology | Admitting: Radiation Oncology

## 2011-11-28 ENCOUNTER — Ambulatory Visit
Admission: RE | Admit: 2011-11-28 | Discharge: 2011-11-28 | Disposition: A | Discharge status: Home / Self Care | Payer: Medicare Other | Source: Ambulatory Visit | Attending: Radiation Oncology | Admitting: Radiation Oncology

## 2011-11-29 ENCOUNTER — Ambulatory Visit
Admission: RE | Admit: 2011-11-29 | Discharge: 2011-11-29 | Disposition: A | Discharge status: Home / Self Care | Payer: Medicare Other | Source: Ambulatory Visit | Attending: Radiation Oncology | Admitting: Radiation Oncology

## 2011-12-02 ENCOUNTER — Ambulatory Visit
Admission: RE | Admit: 2011-12-02 | Discharge: 2011-12-02 | Disposition: A | Discharge status: Home / Self Care | Payer: Medicare Other | Source: Ambulatory Visit | Attending: Radiation Oncology | Admitting: Radiation Oncology

## 2011-12-02 ENCOUNTER — Encounter: Payer: Self-pay | Admitting: Radiation Oncology

## 2011-12-02 VITALS — BP 155/87 | HR 82 | Resp 18 | Wt 202.0 lb

## 2011-12-02 DIAGNOSIS — C50419 Malignant neoplasm of upper-outer quadrant of unspecified female breast: Secondary | ICD-10-CM

## 2011-12-02 NOTE — Progress Notes (Signed)
Weekly Management Note:  Site: Right breast Current Dose:  5000 cGy   Projected Dose: 5000  cGy  Narrative: The patient is seen today for routine under treatment assessment. CBCT/MVCT images/port films were reviewed. The chart was reviewed.   She has discomfort along her right axilla and inframammary region. She has been using Radioplex gel.  Physical Examination:  Filed Vitals:   12/02/11 1555  BP: 155/87  Pulse: 82  Resp: 18  .  Weight: 202 lb (91.627 kg). There is a confluent moist desquamation along the right inframammary region and lower axilla. There is hyperpigmentation the skin and patchy dry desquamation elsewhere along the right breast.  Impression: Tolerating radiation therapy well, although she does have a moist desquamation along the right axilla and inframammary region. We'll change her to Biafine cream, and I have asked her to apply antibiotic ointment to her right axilla and inframammary region where she has moist desquamation. She is to avoid wearing a bra.   Plan: Radiation therapy completed. Followup with Dr. Basilio Cairo in one month. As above

## 2011-12-02 NOTE — Progress Notes (Signed)
Patient presents to the clinic today following final xrt to right breast. Patient is alert and oriented to person, place, and time. No distress noted. Steady gait noted. Pleasant affect noted. Patient denies pain at this time. Patient reports using her Radiaplex bid. Moist desquamation noted at right axilla fold and under right breast. Provided patient with Biafine cream and directed upon use. Patient to follow up with Dr. Donnie Coffin Wednesday. Patient already has a follow up appointment with Dr. Basilio Cairo for next month. Reported all findings to Dr. Dayton Scrape.

## 2011-12-03 MED ORDER — EMOLLIENT BASE EX CREA: TOPICAL_CREAM | CUTANEOUS | Status: AC | PRN

## 2011-12-03 NOTE — Addendum Note (Signed)
Encounter addended by: Agnes Lawrence, RN on: 12/03/2011 10:54 AM<BR>     Documentation filed: Orders

## 2011-12-04 ENCOUNTER — Telehealth: Payer: Self-pay | Admitting: *Deleted

## 2011-12-04 ENCOUNTER — Ambulatory Visit (HOSPITAL_BASED_OUTPATIENT_CLINIC_OR_DEPARTMENT_OTHER): Payer: Medicare Other | Admitting: Oncology

## 2011-12-04 VITALS — BP 130/79 | HR 105 | Temp 98.6°F | Ht <= 58 in | Wt 200.9 lb

## 2011-12-04 DIAGNOSIS — D0510 Intraductal carcinoma in situ of unspecified breast: Secondary | ICD-10-CM

## 2011-12-04 DIAGNOSIS — D0590 Unspecified type of carcinoma in situ of unspecified breast: Secondary | ICD-10-CM

## 2011-12-04 DIAGNOSIS — Z79810 Long term (current) use of selective estrogen receptor modulators (SERMs): Secondary | ICD-10-CM

## 2011-12-04 DIAGNOSIS — E559 Vitamin D deficiency, unspecified: Secondary | ICD-10-CM

## 2011-12-04 HISTORY — DX: Long term (current) use of selective estrogen receptor modulators (serms): Z79.810

## 2011-12-04 MED ORDER — TAMOXIFEN CITRATE 20 MG PO TABS: 20.0000 mg | ORAL_TABLET | Freq: Every day | ORAL | Status: AC

## 2011-12-04 NOTE — Patient Instructions (Signed)
To take 81 mg coated aspirin per day i have prescribed tamoxifen for you to prevenmt the cancer from coming back

## 2011-12-04 NOTE — Telephone Encounter (Signed)
Made patient appointment for 03-19-2012 starting at 10:30am with labs printed out calendar and gave to the patient

## 2011-12-04 NOTE — Progress Notes (Signed)
Hematology and Oncology Follow Up Visit  EVORA SCHECHTER [id number] January 09, 1936 76 y.o. 12/04/2011 11:35 AM   DIAGNOSIS: 76 year old woman with history of DCIS  Encounter Diagnoses  Name Primary?  Marland Kitchen DCIS (ductal carcinoma in situ) Yes  . Unspecified vitamin D deficiency      PAST THERAPY: History of DCIS status post lumpectomy 08/02/2011, reexcision for margins 09/27/2011, completion of radiation to breast 12/02/2011.   Interim History:  She is doing well. She had some skin reactions which she is treating with local measures. Otherwise doing well. Continues to exercise 3 times a week. Has no other complaints.  Medications: I have reviewed the patient's current medications.  Allergies:  Allergies  Allergen Reactions  . Iodides Itching    All over the body.  . Penicillins Itching    All over the body.    Past Medical History, Surgical history, Social history, and Family History were reviewed and updated.  Review of Systems: Constitutional:  Negative for fever, chills, night sweats, anorexia, weight loss, pain. Cardiovascular: negative Respiratory: negative Neurological: negative Dermatological: negative ENT: negative Skin Gastrointestinal: negative Genito-Urinary: negative Hematological and Lymphatic: negative Breast: negative Musculoskeletal: negative Remaining ROS negative.  Physical Exam:  Blood pressure 130/79, pulse 105, temperature 98.6 F (37 C), temperature source Oral, height 4\' 9"  (1.448 m), weight 200 lb 14.4 oz (91.128 kg).  ECOG: 0      Lab Results: Lab Results  Component Value Date   HGB 11.5* 09/27/2011     Chemistry      Component Value Date/Time   NA 136 09/25/2011 1330   K 3.8 09/25/2011 1330   CL 99 09/25/2011 1330   CO2 26 09/25/2011 1330   BUN 26* 09/25/2011 1330   CREATININE 1.38* 09/25/2011 1330      Component Value Date/Time   CALCIUM 9.8 09/25/2011 1330       Radiological Studies:  No results found.   IMPRESSIONS AND  PLAN: A 76 y.o. female with   History of ER/PR positive DCIS status post surgery and radiation. We spent time today discussing adjuvant hormonal therapy in the form of tamoxifen. She is status post hysterectomy. We discussed the low risks of blood clots. She is otherwise fairly good she is somewhat overweight with a BMI of 43.5. I recommend should take low-dose aspirin as well. I will prescribe her tamoxifen and see her in 3 months time. She knows to call should she have any other concerns  Spent more than half the time coordinating care.    Cataleyah Colborn 6/19/201311:35 AM

## 2011-12-16 NOTE — Progress Notes (Signed)
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                       PLAN: Routine followup in one month. Patient instructed to call if questions or worsening complaints in interim.

## 2011-12-31 ENCOUNTER — Encounter: Payer: Self-pay | Admitting: Radiation Oncology

## 2012-01-03 ENCOUNTER — Encounter: Payer: Self-pay | Admitting: Radiation Oncology

## 2012-01-03 ENCOUNTER — Ambulatory Visit
Admission: RE | Admit: 2012-01-03 | Discharge: 2012-01-03 | Disposition: A | Discharge status: Home / Self Care | Payer: Medicare Other | Source: Ambulatory Visit | Attending: Radiation Oncology | Admitting: Radiation Oncology

## 2012-01-03 VITALS — BP 128/77 | HR 74 | Temp 98.0°F | Wt 203.7 lb

## 2012-01-03 DIAGNOSIS — C50419 Malignant neoplasm of upper-outer quadrant of unspecified female breast: Secondary | ICD-10-CM

## 2012-01-03 NOTE — Progress Notes (Addendum)
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 She is doing well. She is exercising at the [facility]. She reports occasional shooting pains in her breast.  She is taking tamoxifen.  Her breast has healed well. She wonders if she can go in the hot tub at the [facility].  ALLERGIES:  is allergic to iodides and penicillins.  Meds: Current Outpatient Prescriptions  Medication Sig Dispense Refill  . aspirin EC 81 MG tablet Take 81 mg by mouth daily.      Marland Kitchen albuterol (PROAIR HFA) 108 (90 BASE) MCG/ACT inhaler Inhale 2 puffs into the lungs every 6 (six) hours as needed.        . budesonide-formoterol (SYMBICORT) 160-4.5 MCG/ACT inhaler Inhale 2 puffs into the lungs 2 (two) times daily.        . Calcium Carbonate-Vit D-Min (CALCIUM 1200 PO) Take by mouth.        . Cholecalciferol (VITAMIN D) 1000 UNITS capsule Take 1,000 Units by mouth daily.        Marland Kitchen diltiazem (CARDIZEM CD) 240 MG 24 hr capsule       . diltiazem (DILACOR XR) 240 MG 24 hr capsule Take 240 mg by mouth daily.        Marland Kitchen emollient (BIAFINE) cream Apply topically as needed.  454 g  0  . latanoprost (XALATAN) 0.005 % ophthalmic solution Place 1 drop into both eyes at bedtime.        Marland Kitchen loratadine (CLARITIN) 10 MG tablet Take 10 mg by mouth daily.        . mometasone (NASONEX) 50 MCG/ACT nasal spray Place 2 sprays into the nose daily.        . Multiple Vitamins-Minerals (CENTRUM SILVER PO) Take by mouth.        . NON FORMULARY as needed. Taking "Patalax" 0.2% eye drops into both eyes as needed.      . NON FORMULARY 5 mLs as needed. Patient using OTC Eye Itch Relief from  [facility] - Ketotifen fumarate eye solution      . non-metallic deodorant (ALRA) MISC Apply 1 application topically daily as needed.      . Omega-3 Fatty Acids (FISH OIL) 1200 MG CAPS Take by mouth.        . ONE TOUCH ULTRA TEST test strip       . ONETOUCH DELICA LANCETS FINE MISC       . Polyethylene Glycol 400 (BLINK TEARS) 0.25 % SOLN Apply to eye.        . simvastatin (ZOCOR) 20 MG tablet Take 20 mg by mouth at bedtime.        . tamoxifen (NOLVADEX) 20 MG tablet Take 1 tablet (20 mg total) by mouth daily.  30 tablet  11  . valsartan-hydrochlorothiazide (DIOVAN-HCT) 160-25 MG per tablet Take 1 tablet by mouth daily.      . vitamin C (ASCORBIC ACID) 500 MG tablet Take 500 mg by mouth daily.        . Wound Cleansers (RADIAPLEX EX) Apply topically.        Physical Findings: The patient is in no acute distress. Patient  is alert and oriented.  weight is 203 lb 11.2 oz (92.398 kg). Her temperature is 98 F (36.7 C). Her blood pressure is 128/77 and her pulse is 74. .  No significant changes. Her right breast has healed very well. There is some residual hyperpigmentation.  She has a couple small areas at the inframammary fold that are still slightly open where the skin has not regenerated completely.   Lab Findings: Lab Results  Component Value Date   HGB 11.5* 09/27/2011     Radiographic Findings: No results found.  Impression:  The patient is recovering from the effects of radiation.    Plan: Continue tamoxifen. She has a small area in the inframammary fold that has not completely healed. I recommended she wait a couple more weeks and allow that to close up completely before she goes into the hot tub at the [facility]. She agrees with this plan. I'll see her back on an as needed basis. Encouraged her to call me if she needs anything in the interim. Patient was encouraged to continue with yearly mammograms and followup with her primary Dr. and medical oncology.   -----------------------------------  Lonie Peak, MD

## 2012-01-03 NOTE — Progress Notes (Signed)
Fu visit today.  Completed treatment on 12/02/11.  She has begun a work out routine involving stretching, lifting weights, leg lifts, treadmill three days weekly.  Left breast with resolved moist desquamation in the inframmary and right lateral breast fold with hypopigmentation in both areas.  No other voiced concerns

## 2012-01-03 NOTE — Addendum Note (Signed)
Encounter addended by: Lonie Peak, MD on: 01/03/2012  5:46 PM<BR>     Documentation filed: Visit Diagnoses, Inpatient Notes, Notes Section

## 2012-02-06 ENCOUNTER — Encounter (INDEPENDENT_AMBULATORY_CARE_PROVIDER_SITE_OTHER): Payer: Self-pay | Admitting: General Surgery

## 2012-02-06 ENCOUNTER — Ambulatory Visit (INDEPENDENT_AMBULATORY_CARE_PROVIDER_SITE_OTHER): Payer: Medicare Other | Admitting: General Surgery

## 2012-02-06 VITALS — BP 150/64 | HR 87 | Temp 97.8°F | Ht <= 58 in | Wt 203.2 lb

## 2012-02-06 DIAGNOSIS — C50419 Malignant neoplasm of upper-outer quadrant of unspecified female breast: Secondary | ICD-10-CM

## 2012-02-06 NOTE — Progress Notes (Signed)
Subjective:     Patient ID: Nicole Holland, female   DOB: February 17, 1936, 76 y.o.   MRN: [id number]  HPI This patient underwent right partial mastectomy on 08/02/2011 for ductal carcinoma in situ of the right breast. There was felt to be a close margin which was reexcised on 09/27/2011. Reexcision showed no evidence of cancer.  Final pathology report showed low-grade ductal carcinoma in situ, receptor positive. There were 3 microscopic foci of DCIS. She had adjuvant radiation therapy with  Dr. Basilio Cairo and has recovered from that. She is taking tamoxifen.  She has no complaints about her breast. She is scheduled for mammograms in December at [facility].  Review of Systems 10 system review of systems is negative except as described above.    Objective:   Physical Exam Constitutional: Patient looked well in no distress. Neck: No adenopathy, mass, or jugular venous distention. Lungs: Clear to auscultation bilaterally Heart: Regular rate and rhythm. No murmur. No ectopy. Breasts: Right breast reveals well-healed scar in the upper outer quadrant. There are some chronic radiation changes inframammary crease and upper outer quadrant. No palpable mass in either breast. No axillary adenopathy on either side. No other skin changes.    Assessment:     Ductal carcinoma in situ, low grade, or central positive.  Uneventful recovery following right partial mastectomy and reexcision of margins in February and April of 2013.    Plan:     Keep scheduled appointments with Dr. Pierce Crane.  Continue tamoxifen  Bilateral mammograms at [facility] December 2013  Return to see me February 2014.   Angelia Mould. Derrell Lolling, M.D., [facility] General and Minimally invasive Surgery Breast and Colorectal Surgery Office:   [phone] Pager:   [phone]

## 2012-02-06 NOTE — Patient Instructions (Signed)
Your breast exam today shows no evidence of cancer. All of the surgical scars have healed normally.  Be sure to get bilateral mammograms at [facility] in December of this year.  Keep all of your regular appointments  with Dr. Pierce Crane  Return to see Dr. Derrell Lolling in February 2014.

## 2012-03-10 ENCOUNTER — Telehealth: Payer: Self-pay | Admitting: *Deleted

## 2012-03-10 NOTE — Telephone Encounter (Signed)
per reschedule md left voice message to inform the patient of the new date and time on 04-06-2012 starting at 10:00am

## 2012-03-19 ENCOUNTER — Ambulatory Visit: Payer: Medicare Other | Admitting: Oncology

## 2012-03-19 ENCOUNTER — Other Ambulatory Visit: Payer: Medicare Other | Admitting: Lab

## 2012-04-06 ENCOUNTER — Ambulatory Visit: Payer: Medicare Other | Admitting: Oncology

## 2012-04-06 ENCOUNTER — Other Ambulatory Visit: Payer: Medicare Other | Admitting: Lab

## 2012-04-23 ENCOUNTER — Telehealth: Payer: Self-pay | Admitting: Oncology

## 2012-04-23 ENCOUNTER — Ambulatory Visit (HOSPITAL_BASED_OUTPATIENT_CLINIC_OR_DEPARTMENT_OTHER): Payer: Medicare Other | Admitting: Oncology

## 2012-04-23 ENCOUNTER — Other Ambulatory Visit (HOSPITAL_BASED_OUTPATIENT_CLINIC_OR_DEPARTMENT_OTHER): Payer: Medicare Other | Admitting: Lab

## 2012-04-23 VITALS — BP 155/82 | HR 86 | Temp 98.0°F | Resp 20 | Ht <= 58 in | Wt 207.0 lb

## 2012-04-23 DIAGNOSIS — D0590 Unspecified type of carcinoma in situ of unspecified breast: Secondary | ICD-10-CM

## 2012-04-23 DIAGNOSIS — E559 Vitamin D deficiency, unspecified: Secondary | ICD-10-CM

## 2012-04-23 DIAGNOSIS — D0510 Intraductal carcinoma in situ of unspecified breast: Secondary | ICD-10-CM

## 2012-04-23 DIAGNOSIS — C50419 Malignant neoplasm of upper-outer quadrant of unspecified female breast: Secondary | ICD-10-CM

## 2012-04-23 LAB — COMPREHENSIVE METABOLIC PANEL (CC13)
AST: 27 U/L (ref 5–34)
Albumin: 3.7 g/dL (ref 3.5–5.0)
Alkaline Phosphatase: 46 U/L (ref 40–150)
BUN: 28 mg/dL — ABNORMAL HIGH (ref 7.0–26.0)
Calcium: 10.3 mg/dL (ref 8.4–10.4)
Chloride: 103 mEq/L (ref 98–107)
Creatinine: 1.5 mg/dL — ABNORMAL HIGH (ref 0.6–1.1)
Glucose: 130 mg/dl — ABNORMAL HIGH (ref 70–99)
Potassium: 3.8 mEq/L (ref 3.5–5.1)

## 2012-04-23 LAB — CBC WITH DIFFERENTIAL/PLATELET
BASO%: 1.5 % (ref 0.0–2.0)
Basophils Absolute: 0.1 10*3/uL (ref 0.0–0.1)
EOS%: 3.3 % (ref 0.0–7.0)
HCT: 37.6 % (ref 34.8–46.6)
HGB: 13 g/dL (ref 11.6–15.9)
LYMPH%: 24.4 % (ref 14.0–49.7)
MCH: 28.9 pg (ref 25.1–34.0)
MCHC: 34.7 g/dL (ref 31.5–36.0)
NEUT%: 61 % (ref 38.4–76.8)
Platelets: 172 10*3/uL (ref 145–400)

## 2012-04-23 NOTE — Telephone Encounter (Signed)
gve the pt her nov 2014 appt calendar °

## 2012-04-23 NOTE — Progress Notes (Signed)
Hematology and Oncology Follow Up Visit  Nicole Holland [id number] 20-Nov-1935 76 y.o. 04/23/2012 11:17 AM   DIAGNOSIS: 76 year old woman with history of DCIS  No diagnosis found.   PAST THERAPY: History of DCIS status post lumpectomy 08/02/2011, reexcision for margins 09/27/2011, completion of radiation to breast 12/02/2011. She has been on tamoxifen since August 2013. Interim History:  She is doing well. She had some skin reactions which she is treating with local measures. Otherwise doing well. Continues to exercise 3 times a week. Has no other complaints. She is following tamoxifen well. She has some chronic joint pains. She does not have hot flashes. She she denies abnormal vaginal bleeding.  Medications: I have reviewed the patient's current medications.  Allergies:  Allergies  Allergen Reactions  . Iodides Itching    All over the body.  . Penicillins Itching    All over the body.    Past Medical History, Surgical history, Social history, and Family History were reviewed and updated.  Review of Systems: Constitutional:  Negative for fever, chills, night sweats, anorexia, weight loss, pain. Cardiovascular: negative Respiratory: negative Neurological: negative Dermatological: negative ENT: negative Skin Gastrointestinal: negative Genito-Urinary: negative Hematological and Lymphatic: negative Breast: negative Musculoskeletal: negative Remaining ROS negative.  Physical Exam:  Blood pressure 155/82, pulse 86, temperature 98 F (36.7 C), resp. rate 20, height 4\' 9"  (1.448 m), weight 207 lb (93.895 kg).  ECOG: 0  HEENT:  Sclerae anicteric, conjunctivae pink.  Oropharynx clear.  No mucositis or candidiasis.  Nodes:  No cervical, supraclavicular, or axillary lymphadenopathy palpated.  Breast Exam:  Right breast is benign.  No masses, discharge, skin change, or nipple inversion, lumpectomy incision is healed well. Radiation changes are resolving. Left breast is benign.   No masses, discharge, skin change, or nipple inversion..  Lungs:  Clear to auscultation bilaterally.  No crackles, rhonchi, or wheezes.  Heart:  Regular rate and rhythm.  Abdomen:  Soft, nontender.  Positive bowel sounds.  No organomegaly or masses palpated.  Musculoskeletal:  No focal spinal tenderness to palpation.  Extremities:  Benign.  No peripheral edema or cyanosis.  Skin:  Benign.  Neuro:  Nonfocal.     Lab Results: Lab Results  Component Value Date   WBC 9.2 04/23/2012   HGB 13.0 04/23/2012   HCT 37.6 04/23/2012   MCV 83.4 04/23/2012   PLT 172 04/23/2012     Chemistry      Component Value Date/Time   NA 136 09/25/2011 1330   K 3.8 09/25/2011 1330   CL 99 09/25/2011 1330   CO2 26 09/25/2011 1330   BUN 26* 09/25/2011 1330   CREATININE 1.38* 09/25/2011 1330      Component Value Date/Time   CALCIUM 9.8 09/25/2011 1330       Radiological Studies:  No results found.   IMPRESSIONS AND PLAN: A 76 y.o. female with   History of ER/PR positive DCIS status post surgery and radiation. She appears doing well. Your mammogram this month. I plan to see her in a years time. She'll continue on tamoxifen as well as low-dose aspirin. Spent more than half the time coordinating care.    Nicole Holland 11/7/201311:17 AM

## 2012-04-27 ENCOUNTER — Encounter: Payer: Self-pay | Admitting: *Deleted

## 2012-04-27 NOTE — Progress Notes (Signed)
Called and spoke with patient concerning her Vitamin D level.  Informed her that per Dr. Donnie Coffin she needs take 1/2 pill of Vitamin D due to elevated levels.  Patient verbalized understanding.

## 2012-06-23 ENCOUNTER — Encounter (INDEPENDENT_AMBULATORY_CARE_PROVIDER_SITE_OTHER): Payer: Self-pay

## 2012-08-06 ENCOUNTER — Encounter (INDEPENDENT_AMBULATORY_CARE_PROVIDER_SITE_OTHER): Payer: Self-pay | Admitting: General Surgery

## 2012-08-06 ENCOUNTER — Ambulatory Visit (INDEPENDENT_AMBULATORY_CARE_PROVIDER_SITE_OTHER): Payer: Medicare Other | Admitting: General Surgery

## 2012-08-06 VITALS — BP 152/82 | HR 88 | Temp 98.3°F | Resp 20 | Ht <= 58 in | Wt 212.0 lb

## 2012-08-06 DIAGNOSIS — C50419 Malignant neoplasm of upper-outer quadrant of unspecified female breast: Secondary | ICD-10-CM

## 2012-08-06 NOTE — Patient Instructions (Signed)
Your breast exam and lymph node exam today is normal. Your mammograms that were done in December are alsonormal. There is no evidence of cancer.  Continue taking the tamoxifen.  You should call the cancer center and arrange to be assigned to one of the other breast cancer doctors.  Be sure to get by your bilateral mammograms in December 2014.  Return to see Dr. Derrell Lolling in one year.

## 2012-08-06 NOTE — Progress Notes (Signed)
Patient ID: Nicole Holland, female   DOB: Aug 30, 1935, 77 y.o.   MRN: [id number]  Chief Complaint  Patient presents with  . Breast Cancer Long Term Follow Up    follow up breast ca    HPI NEETU Holland is a 77 y.o. female.  She returns for long-term followup regarding her noninvasive right breast cancer.  She underwent right partial mastectomy on 08/02/2011 for DCIS of the right breast. They felt there was a close margin which I reexcised on 09/27/2011. The reexcision showed no evidence of cancer. She recovered from that surgery and had adjuvant radiation therapy.  Pathology report showed low-grade DCIS, receptor positive. There were 3 microscopic foci of DCIS. She is now taking tamoxifen. She was Dr. Renelda Loma  patient and has not yet been reassigned.  She has no complaints about her breast  or her arm. No arm swelling. No breast pain.  Mammograms were performed at [facility] on 05/20/2012 and they looked fine, no focal abnormality, category 2.  HPI  Past Medical History  Diagnosis Date  . Hypertension   . Hyperlipidemia   . Arthritis   . Asthma   . Breast cancer 08/02/11    right low grade ductal ca in situ upper outer quad   . S/P radiation therapy 10/28/11 - 12/02/11    Right Breast/ 5000cGy/ 25 Fractions  . Vitamin D deficiency   . Use of tamoxifen (Nolvadex) 12/04/11    Dr. Donnie Coffin    Past Surgical History  Procedure Laterality Date  . Breast surgery  02/01/2000    right breast subareolar core biopsy  . Cesarean section    . Tonsillectomy    . Breast lumpectomy  08/02/11    Lumpectomy right Breast; Low Grade Ductal Carcinoma In Situ with Associated Calcifications, ER 65%, PR 99%  . Breast surgery  09/27/11    Right Breast; Excision of  Anterior Margin- Benign Breast Parenchyma, Negative for In situ and Invasive Cancer, Surgical Margins Negative, Benign skin, Negative for Malignancy  . Abdominal hysterectomy  1994    partial ovaries intact    History reviewed. No pertinent  family history.  Social History History  Substance Use Topics  . Smoking status: Never Smoker   . Smokeless tobacco: Never Used  . Alcohol Use: No    Allergies  Allergen Reactions  . Iodides Itching    All over the body.  . Penicillins Itching    All over the body.    Current Outpatient Prescriptions  Medication Sig Dispense Refill  . terbinafine (LAMISIL) 250 MG tablet Take 250 mg by mouth daily.      Marland Kitchen albuterol (PROAIR HFA) 108 (90 BASE) MCG/ACT inhaler Inhale 2 puffs into the lungs every 6 (six) hours as needed.        Marland Kitchen aspirin EC 81 MG tablet Take 81 mg by mouth daily.      . budesonide-formoterol (SYMBICORT) 160-4.5 MCG/ACT inhaler Inhale 2 puffs into the lungs 2 (two) times daily.        . Calcium Carbonate-Vit D-Min (CALCIUM 1200 PO) Take by mouth.        . Cholecalciferol (VITAMIN D) 1000 UNITS capsule Take 1,000 Units by mouth daily.        Marland Kitchen diltiazem (CARDIZEM CD) 240 MG 24 hr capsule       . diltiazem (DILACOR XR) 240 MG 24 hr capsule Take 240 mg by mouth daily.        Marland Kitchen emollient (BIAFINE) cream Apply topically as needed.  454 g  0  . latanoprost (XALATAN) 0.005 % ophthalmic solution Place 1 drop into both eyes at bedtime.        Marland Kitchen loratadine (CLARITIN) 10 MG tablet Take 10 mg by mouth daily.        . mometasone (NASONEX) 50 MCG/ACT nasal spray Place 2 sprays into the nose daily.        . Multiple Vitamins-Minerals (CENTRUM SILVER PO) Take by mouth.        . NON FORMULARY as needed. Taking "Patalax" 0.2% eye drops into both eyes as needed.      . NON FORMULARY 5 mLs as needed. Patient using OTC Eye Itch Relief from [facility] - Ketotifen fumarate eye solution      . non-metallic deodorant (ALRA) MISC Apply 1 application topically daily as needed.      . Omega-3 Fatty Acids (FISH OIL) 1200 MG CAPS Take by mouth.        . ONE TOUCH ULTRA TEST test strip       . ONETOUCH DELICA LANCETS FINE MISC       . Polyethylene Glycol 400 (BLINK TEARS) 0.25 % SOLN Apply to eye.         . simvastatin (ZOCOR) 20 MG tablet Take 20 mg by mouth at bedtime.        . tamoxifen (NOLVADEX) 20 MG tablet Take 20 mg by mouth daily.      . valsartan-hydrochlorothiazide (DIOVAN-HCT) 160-25 MG per tablet Take 1 tablet by mouth daily.      . vitamin C (ASCORBIC ACID) 500 MG tablet Take 500 mg by mouth daily.        . Wound Cleansers (RADIAPLEX EX) Apply topically.       No current facility-administered medications for this visit.    Review of Systems Review of Systems  Constitutional: Negative for fever, chills and unexpected weight change.  HENT: Negative for hearing loss, congestion, sore throat, trouble swallowing and voice change.   Eyes: Negative for visual disturbance.  Respiratory: Negative for cough and wheezing.   Cardiovascular: Negative for chest pain, palpitations and leg swelling.  Gastrointestinal: Negative for nausea, vomiting, abdominal pain, diarrhea, constipation, blood in stool, abdominal distention and anal bleeding.  Genitourinary: Negative for hematuria, vaginal bleeding and difficulty urinating.  Musculoskeletal: Negative for arthralgias.  Skin: Negative for rash and wound.  Neurological: Negative for seizures, syncope and headaches.  Hematological: Negative for adenopathy. Does not bruise/bleed easily.  Psychiatric/Behavioral: Negative for confusion.    Blood pressure 152/82, pulse 88, temperature 98.3 F (36.8 C), resp. rate 20, height 4\' 9"  (1.448 m), weight 212 lb (96.163 kg).  Physical Exam Physical Exam  Constitutional: She is oriented to person, place, and time. She appears well-developed and well-nourished. No distress.  HENT:  Head: Normocephalic and atraumatic.  Nose: Nose normal.  Mouth/Throat: No oropharyngeal exudate.  Eyes: Conjunctivae and EOM are normal. Pupils are equal, round, and reactive to light. Left eye exhibits no discharge. No scleral icterus.  Neck: Neck supple. No JVD present. No tracheal deviation present. No thyromegaly  present.  Cardiovascular: Normal rate, regular rhythm, normal heart sounds and intact distal pulses.   No murmur heard. Pulmonary/Chest: Effort normal and breath sounds normal. No respiratory distress. She has no wheezes. She has no rales. She exhibits no tenderness.  Well healed scar upper outer quadrant right breast. Excellent contour, and symmetry. Chronic radiation changes in the breast and inframammary crease. No palpable mass in either breast. No axillary or supraclavicular  adenopathy. No other skin changes.  Abdominal: Soft. Bowel sounds are normal. She exhibits no distension and no mass. There is no tenderness. There is no rebound and no guarding.  Musculoskeletal: She exhibits no edema and no tenderness.  Lymphadenopathy:    She has no cervical adenopathy.  Neurological: She is alert and oriented to person, place, and time. She exhibits normal muscle tone. Coordination normal.  Skin: Skin is warm. No rash noted. She is not diaphoretic. No erythema. No pallor.  Psychiatric: She has a normal mood and affect. Her behavior is normal. Judgment and thought content normal.    Data Reviewed My notes. Cancer center notes. Imaging studies.  Assessment    Low grade DCIS right breast, upper outer quadrant, receptor positive  No evidence of recurrent cancer 1 year following right partial mastectomy, adjuvant radiation therapy, and ongoing adjuvant tamoxifen     Plan    Continue tamoxifen.  The patient will call to be reassigned to one of the other breast oncologists  Bilateral mammograms in December 2014  Return to see me in one year.        Angelia Mould. Derrell Lolling, M.D., [facility] General and Minimally invasive Surgery Breast and Colorectal Surgery Office:   [phone] Pager:   [phone]  08/06/2012, 12:27 PM

## 2012-08-13 ENCOUNTER — Telehealth: Payer: Self-pay | Admitting: *Deleted

## 2012-08-13 NOTE — Telephone Encounter (Signed)
Received call from patient wanting to reschedule her appt. With a new provider.  Confirmed appt. For 04/29/13 at 10am with Dr. Welton Flakes.

## 2012-09-05 ENCOUNTER — Encounter: Payer: Self-pay | Admitting: *Deleted

## 2012-09-05 ENCOUNTER — Encounter: Payer: Self-pay | Admitting: Oncology

## 2012-09-05 NOTE — Progress Notes (Signed)
Mailed letter & calendar to pt. 

## 2012-11-26 ENCOUNTER — Other Ambulatory Visit: Payer: Self-pay | Admitting: *Deleted

## 2012-11-26 DIAGNOSIS — C50419 Malignant neoplasm of upper-outer quadrant of unspecified female breast: Secondary | ICD-10-CM

## 2012-11-26 MED ORDER — TAMOXIFEN CITRATE 20 MG PO TABS: 20.0000 mg | ORAL_TABLET | Freq: Every day | ORAL | Status: DC

## 2013-01-05 ENCOUNTER — Other Ambulatory Visit (HOSPITAL_COMMUNITY): Payer: Self-pay | Admitting: Internal Medicine

## 2013-01-05 ENCOUNTER — Ambulatory Visit (HOSPITAL_COMMUNITY)
Admission: RE | Admit: 2013-01-05 | Discharge: 2013-01-05 | Disposition: A | Discharge status: Home / Self Care | Payer: Medicare Other | Source: Ambulatory Visit | Attending: Internal Medicine | Admitting: Internal Medicine

## 2013-01-05 DIAGNOSIS — M25561 Pain in right knee: Secondary | ICD-10-CM

## 2013-01-05 DIAGNOSIS — M25569 Pain in unspecified knee: Secondary | ICD-10-CM | POA: Insufficient documentation

## 2013-01-05 DIAGNOSIS — M79609 Pain in unspecified limb: Secondary | ICD-10-CM

## 2013-01-05 DIAGNOSIS — M7989 Other specified soft tissue disorders: Secondary | ICD-10-CM

## 2013-01-05 NOTE — Progress Notes (Signed)
*  PRELIMINARY RESULTS* Vascular Ultrasound Right lower extremity venous duplex has been completed.  Preliminary findings:  Negative for DVT. Large baker's cyst measuring 6 cm noted.  Called report to Dr. Chestine Spore.   Farrel Demark, RDMS, RVT  01/05/2013, 4:17 PM

## 2013-03-09 ENCOUNTER — Other Ambulatory Visit: Payer: Self-pay | Admitting: Internal Medicine

## 2013-03-09 ENCOUNTER — Ambulatory Visit (HOSPITAL_COMMUNITY)
Admission: RE | Admit: 2013-03-09 | Discharge: 2013-03-09 | Disposition: A | Discharge status: Home / Self Care | Payer: Medicare Other | Source: Ambulatory Visit | Attending: Internal Medicine | Admitting: Internal Medicine

## 2013-03-09 DIAGNOSIS — R52 Pain, unspecified: Secondary | ICD-10-CM

## 2013-03-09 DIAGNOSIS — M7989 Other specified soft tissue disorders: Secondary | ICD-10-CM | POA: Insufficient documentation

## 2013-03-09 DIAGNOSIS — M24649 Ankylosis, unspecified hand: Secondary | ICD-10-CM | POA: Insufficient documentation

## 2013-04-27 ENCOUNTER — Ambulatory Visit: Payer: Medicare Other | Admitting: Oncology

## 2013-04-27 ENCOUNTER — Other Ambulatory Visit: Payer: Medicare Other | Admitting: Lab

## 2013-04-29 ENCOUNTER — Encounter: Payer: Self-pay | Admitting: Family

## 2013-04-29 ENCOUNTER — Ambulatory Visit (HOSPITAL_BASED_OUTPATIENT_CLINIC_OR_DEPARTMENT_OTHER): Payer: Medicare Other | Admitting: Family

## 2013-04-29 ENCOUNTER — Encounter (INDEPENDENT_AMBULATORY_CARE_PROVIDER_SITE_OTHER): Payer: Self-pay

## 2013-04-29 ENCOUNTER — Telehealth: Payer: Self-pay | Admitting: Oncology

## 2013-04-29 ENCOUNTER — Ambulatory Visit: Payer: Medicare Other | Admitting: Oncology

## 2013-04-29 VITALS — BP 141/85 | HR 83 | Temp 98.2°F | Resp 18 | Ht <= 58 in | Wt 207.2 lb

## 2013-04-29 DIAGNOSIS — C50419 Malignant neoplasm of upper-outer quadrant of unspecified female breast: Secondary | ICD-10-CM

## 2013-04-29 DIAGNOSIS — Z853 Personal history of malignant neoplasm of breast: Secondary | ICD-10-CM

## 2013-04-29 DIAGNOSIS — D0590 Unspecified type of carcinoma in situ of unspecified breast: Secondary | ICD-10-CM

## 2013-04-29 MED ORDER — TAMOXIFEN CITRATE 20 MG PO TABS: 20.0000 mg | ORAL_TABLET | Freq: Every day | ORAL | Status: DC

## 2013-04-29 NOTE — Progress Notes (Addendum)
[facility]  [phone] [phone]  OFFICE PROGRESS NOTE   PATIENT: Nicole Holland   DOB: 11-27-1935  MR#: [id number]  ZHY#:[id number]   EX:[id number] S, MD SU: Claud Kelp, M.D. RAD ONC: Lonie Peak, M.D.   DIAGNOSIS: Nicole Holland is a 77 y.o. female with a history of right breast DCIS, status post lumpectomy and radiation therapy.   PRIOR THERAPY: 1.  Right breast needle core biopsy at the 12 o'clock position on 05/23/2011 which showed atypical ductal hyperplasia, hyalinized fibroadenoma with calcifications.  2.  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  3.  Status post right breast anterior margin excision on 09/27/2011 which showed benign breast parenchyma, surgical margins negative for atypia or malignancy.  4.  Radiation therapy from 10/28/2011 through 12/02/2011.  5.  Antiestrogen therapy with Tamoxifen started in 11/2011.   CURRENT THERAPY: Tamoxifen 20 mg by mouth daily since 11/2011.   INTERVAL HISTORY: Dr. Welton Flakes and I saw Nicole Holland today for followup of DCIS of the right breast . Since her last office visit with Dr. Donnie Coffin on 04/23/2012, Nicole Holland reports a new diagnosis of erosive arthritis and states that her left middle metacarpal was inflamed and treated with antibiotics and prednisone.  She was also diagnosed with a right lower extremity Baker's cyst on 01/05/2013.  Nicole Holland continues to remain physically active and states she exercises at the [facility] and at the local senior's center.  Her interval history is otherwise unremarkable and stable. The patient is establishing herself with Dr. Milta Deiters service today.   PAST MEDICAL HISTORY: Past Medical History  Diagnosis Date  .  Hypertension   . Hyperlipidemia   . Arthritis   . Asthma   . Breast cancer 08/02/11    right low grade ductal ca in situ upper outer quad   . S/P radiation therapy 10/28/11 - 12/02/11    Right Breast/ 5000cGy/ 25 Fractions  . Vitamin D deficiency   . Use of tamoxifen (Nolvadex) 12/04/11    Dr. Donnie Coffin    PAST SURGICAL HISTORY: Past Surgical History  Procedure Laterality Date  . Breast surgery  02/01/2000    right breast subareolar core biopsy  . Cesarean section    . Tonsillectomy    . Breast lumpectomy  08/02/11    Lumpectomy right Breast; Low Grade Ductal Carcinoma In Situ with Associated Calcifications, ER 65%, PR 99%  . Breast surgery  09/27/11    Right Breast; Excision of  Anterior Margin- Benign Breast Parenchyma, Negative for In situ and Invasive Cancer, Surgical Margins Negative, Benign skin, Negative for Malignancy  . Abdominal hysterectomy  1994    partial ovaries intact    FAMILY HISTORY: Family History  Problem Relation Age of Onset  . Stroke Mother   . Diabetes Mother   . Cancer Father     Lung cancer  . Diabetes Sister   . Cancer Sister     Unknown Cancer  . Diabetes Brother   . Cancer Brother     Unknown cancer  . Diabetes Brother   . Cancer Brother     Unknown cancer  . Cancer Brother     Unknown Cancer  . Diabetes Brother   . Cancer Brother     Unknown Cancer    SOCIAL HISTORY:  History  Substance Use Topics  . Smoking status: Never Smoker   . Smokeless tobacco: Never Used  . Alcohol Use: No    ALLERGIES: Allergies  Allergen Reactions  . Iodides Itching    All over the body.  . Penicillins Itching    All over the body.    MEDICATIONS:  Current Outpatient Prescriptions  Medication Sig Dispense Refill  . albuterol (PROAIR HFA) 108 (90 BASE) MCG/ACT inhaler Inhale 2 puffs into the lungs every 6 (six) hours as needed.        Marland Kitchen aspirin EC 81 MG tablet Take 81 mg by mouth daily.      . budesonide-formoterol (SYMBICORT) 160-4.5 MCG/ACT inhaler  Inhale 2 puffs into the lungs 2 (two) times daily.        . calcium carbonate (OS-CAL) 600 MG TABS tablet Take 600 mg by mouth 2 (two) times daily with a meal.      . cetirizine (ZYRTEC) 10 MG tablet Take 10 mg by mouth daily.      Marland Kitchen diltiazem (DILACOR XR) 240 MG 24 hr capsule Take 240 mg by mouth daily.        Marland Kitchen latanoprost (XALATAN) 0.005 % ophthalmic solution Place 1 drop into both eyes at bedtime.        . mometasone (NASONEX) 50 MCG/ACT nasal spray Place 2 sprays into the nose daily.        . Multiple Vitamins-Minerals (CENTRUM SILVER PO) Take 1 tablet by mouth daily.       . naproxen sodium (ANAPROX) 220 MG tablet Take 220 mg by mouth every 12 (twelve) hours as needed.      . Olopatadine HCl (PATADAY) 0.2 % SOLN Apply 1 drop to eye as needed. On drop in each eye as need for  itching      . Omega-3 Fatty Acids (FISH OIL) 1200 MG CAPS Take 1 capsule by mouth 2 (two) times daily.       . ONE TOUCH ULTRA TEST test strip       . ONETOUCH DELICA LANCETS FINE MISC       . Polyethylene Glycol 400 (BLINK TEARS) 0.25 % SOLN Apply 1 drop to eye as needed.       . simvastatin (ZOCOR) 20 MG tablet Take 20 mg by mouth at bedtime.        . tamoxifen (NOLVADEX) 20 MG tablet Take 1 tablet (20 mg total) by mouth daily.  90 tablet  3  . valsartan-hydrochlorothiazide (DIOVAN-HCT) 160-25 MG per tablet Take 1 tablet by mouth daily.      . vitamin C (ASCORBIC ACID) 500 MG tablet Take 500 mg by mouth daily.         No current facility-administered medications for this visit.      REVIEW OF SYSTEMS: A 10 point review of systems was completed and is negative except as noted above. Nicole Holland denies any other symptomatology including  fatigue, fever or chills, headache, vision changes, swollen glands, cough or shortness of breath, chest pain or discomfort, nausea, vomiting, diarrhea, constipation, change in urinary or bowel habits, any other arthralgias/myalgias, unusual bleeding/bruising or any other  symptomatology.   PHYSICAL EXAMINATION: BP 141/85  Pulse 83  Temp(Src) 98.2 F (36.8 C) (Oral)  Resp 18  Ht 4\' 9"  (1.448 m)  Wt 207 lb 3.2 oz (93.985 kg)  BMI 44.83 kg/m2   ECOG FS:  1 - Symptomatic but completely ambulatory  General appearance: Alert, cooperative, well nourished, no apparent distress  Head: Normocephalic, alopecia, atraumatic Eyes: Arcus senilis, PERRLA, EOMI Nose: Nares, septum and mucosa are normal, no drainage or sinus tenderness Neck: No adenopathy, supple, symmetrical, trachea midline, no tenderness Resp: Clear to auscultation bilaterally, no wheezes/rales/rhonchi Cardio: Regular rate and rhythm, S1, S2 normal, no murmur, click, rub or gallop, +3 bilateral lower extremity edema wearing compression stockings Breasts:  Pendulous bilaterally, glandular/nodular breast tissue bilaterally, right breast has well-healed surgical scars, right breast has architectural and radiation changes noted, bilateral axillary fullness, firm medial and inframammary breast ridges GI: Soft, distended, non-tender,normoactive bowel sounds, excessive habitus Skin: No rashes/lesions, skin warm and dry, no erythematous areas, no cyanosis  M/S:  Atraumatic, normal strength in all extremities, normal range of motion, no clubbing  Lymph nodes: Cervical, supraclavicular, and axillary nodes normal Neurologic: Grossly normal, cranial nerves II through XII intact, alert and oriented x 3 Psych: Appropriate affect   LAB RESULTS: Lab Results  Component Value Date   WBC 9.2 04/23/2012   NEUTROABS 5.6 04/23/2012   HGB 13.0 04/23/2012   HCT 37.6 04/23/2012   MCV 83.4 04/23/2012   PLT 172 04/23/2012      Chemistry      Component Value Date/Time   NA 138 04/23/2012 1023   NA 136 09/25/2011 1330   K 3.8 04/23/2012 1023   K 3.8 09/25/2011 1330   CL 103 04/23/2012 1023   CL 99 09/25/2011 1330   CO2 29 04/23/2012 1023   CO2 26 09/25/2011 1330   BUN 28.0* 04/23/2012 1023   BUN 26* 09/25/2011 1330    CREATININE 1.5* 04/23/2012 1023   CREATININE 1.38* 09/25/2011 1330      Component Value Date/Time   CALCIUM 10.3 04/23/2012 1023   CALCIUM 9.8 09/25/2011 1330   ALKPHOS 46 04/23/2012 1023   AST 27 04/23/2012 1023   ALT 34 04/23/2012 1023   BILITOT 0.69 04/23/2012 1023       No results found for this basename: [id number]    No components found with this basename: [id number]     RADIOGRAPHIC STUDIES: Dg Hand Complete Left 03/09/2013   CLINICAL DATA:  Swollen middle finger. No injury.  EXAM: LEFT HAND - COMPLETE 3+ VIEW  COMPARISON:  None.  FINDINGS: Soft tissue swelling left middle finger. Distal interphalangeal joint space with findings more suggestive of osteoarthritis however, erosive changes of the 2nd and 3rd metacarpal head raises possibility of erosive arthritis.  Degenerative changes distal radial-carpal articulation. Slight widening of the scapholunate interval. Congenital fusion scaphoid and triquetrum.  No plain film evidence of osteomyelitis.  No fracture or dislocation  IMPRESSION: Soft tissue swelling left middle finger. Distal interphalangeal joint space with findings more suggestive of osteoarthritis however, erosive changes of the 2nd and 3rd metacarpal head raises possibility of erosive arthritis.  Degenerative changes distal radial-carpal articulation.  Slight widening of the scapholunate interval. Congenital fusion scaphoid and triquetrum.   Electronically Signed   By: Bridgett Larsson   On: 03/09/2013 15:01   Dg Finger Middle Left 03/09/2013   CLINICAL DATA:  Swollen middle finger. No injury.  EXAM: LEFT MIDDLE FINGER 2+V  COMPARISON:  Left hand same day.  FINDINGS: Soft tissue swelling left middle finger. Distal interphalangeal joint space with findings more suggestive of osteoarthritis however, erosive changes of the 2nd and 3rd metacarpal head raises possibility of erosive arthritis.  No fracture or dislocation. No plain film evidence of osteomyelitis.  IMPRESSION: Soft tissue swelling  left middle finger. Distal interphalangeal joint space with findings more suggestive of osteoarthritis however,  erosive changes of the 2nd and 3rd metacarpal head raises possibility of erosive arthritis.   Electronically Signed   By: Bridgett Larsson   On: 03/09/2013 14:57   Bilateral digital diagnostic mammogram on 05/20/2012 showed the breasts are again noted to be involved a mild degree with scattered proper glandular structures.  Post excisional biopsy changes are noted on the right.  No new calcifications are seen.  Old calcifications are associated with degenerative fibroadenomas are again noted on the right.  A solitary marking clip is seen in each breast.  No other abnormalities or changes identified.  Benign findings.   ASSESSMENT: Nicole Holland is a  77 y.o. woman: 1.  Right breast needle core biopsy at the 12 o'clock position on 05/23/2011 which showed atypical ductal hyperplasia, hyalinized fibroadenoma with calcifications.  2.  Status post right breast lumpectomy on 08/02/2011 for a stage 0, pTis, pNX, low-grade ductal carcinoma in situ with associated calcification, with 3 microscopic foci screening 0.3, 0.2, and less than 0.1 cm, estrogen receptor 65% positive, progesterone receptor 99% positive.  3.  Status post right breast anterior margin excision on 09/27/2011 which showed benign breast parenchyma, surgical margins negative for atypia or malignancy.  4.  Radiation therapy from 10/28/2011 through 12/02/2011.  5.  Antiestrogen therapy with Tamoxifen started in 11/2011.  PLAN: 1.  Nicole Holland will continue antiestrogen therapy with Tamoxifen, with planned therapy for total of 5 years, until 11/2016. An electronic prescription for tamoxifen 20 mg by mouth daily #90 with 3 refills was sent to the patient's pharmacy.  2.  We plan to see Nicole Holland again in one year at which time we will check laboratories of CBC and CMP.  We will order her next bilateral digital diagnostic mammogram  due in 05/2013 for her.  All questions were answered.  The patient was encouraged to contact us in the interim with any problems, questions or concerns.    Larina Bras, NP-C 05/01/2013    7:24 PM  ATTENDING'S ATTESTATION:  I personally reviewed patient's chart, examined patient myself, formulated the treatment plan as followed.    Patient is a 77 year old very nice female who was originally diagnosed with low-grade ductal carcinoma in situ in 2012. Tumor was ER positive PR positive. She underwent a lumpectomy. This was followed by radiation therapy. She has been on antiestrogen therapy with tamoxifen since June 2013. Overall she's been tolerating this well without any significant problems. We will plan on treating her for total of 5 years. She's tolerated tamoxifen. Patient overall has a very good prognosis. She will continue to get annual mammograms.  Patient will be seen back in one year for followup with me.  Drue Second, MD Medical/Oncology [facility] [phone] (beeper) [phone] (Office)  05/19/2013, 9:04 AM

## 2013-04-29 NOTE — Patient Instructions (Signed)
Please contact us at [phone] if you have any questions or concerns.  Please continue to do well and enjoy life!!!  Get plenty of rest, drink plenty of water, exercise daily (walking as tolerated), eat a balanced diet.  Complete monthly self-breast examinations.  Have a clinical breast exam by a physician every year.  Have your mammogram completed every year.

## 2013-04-30 ENCOUNTER — Other Ambulatory Visit: Payer: Medicare Other | Admitting: Lab

## 2013-04-30 ENCOUNTER — Ambulatory Visit: Payer: Medicare Other | Admitting: Oncology

## 2013-06-02 ENCOUNTER — Encounter (INDEPENDENT_AMBULATORY_CARE_PROVIDER_SITE_OTHER): Payer: Self-pay

## 2013-07-22 ENCOUNTER — Ambulatory Visit (INDEPENDENT_AMBULATORY_CARE_PROVIDER_SITE_OTHER): Payer: Medicare Other | Admitting: General Surgery

## 2013-07-22 ENCOUNTER — Encounter (INDEPENDENT_AMBULATORY_CARE_PROVIDER_SITE_OTHER): Payer: Self-pay | Admitting: General Surgery

## 2013-07-22 VITALS — BP 168/84 | HR 88 | Temp 97.6°F | Resp 20 | Ht <= 58 in | Wt 205.0 lb

## 2013-07-22 DIAGNOSIS — C50419 Malignant neoplasm of upper-outer quadrant of unspecified female breast: Secondary | ICD-10-CM

## 2013-07-22 NOTE — Progress Notes (Signed)
Patient ID: Nicole Holland, female   DOB: 09/12/1935, 78 y.o.   MRN: [id number] History: Nicole Holland is a 78 y.o. female. She returns for long-term followup regarding her noninvasive right breast cancer.  She underwent right partial mastectomy on 08/02/2011 for DCIS of the right breast. They felt there was a close margin which I reexcised on 09/27/2011. The reexcision showed no evidence of cancer. She recovered from that surgery and had adjuvant radiation therapy.  Pathology report showed low-grade DCIS, receptor positive. There were 3 microscopic foci of DCIS. She is now taking tamoxifen. She was Dr. Julien Girt patient and has now transferred to Dr. Humphrey Rolls.  She has no complaints about her breast or her arm. No arm swelling. No breast pain.  Mammograms were performed at [facility] on 05/21/2013 and they looked fine, no focal abnormality, category 2. She sees Dr. Jeanann Lewandowsky routinely for primary care.  Past history, family history, social history, and review of systems are documented on the chart, unchanged, and noncontributory except as described above.   Exam: Constitutional: She is oriented to person, place, and time. She appears well-developed and well-nourished. No distress.   Eyes: Conjunctivae and EOM are normal. Pupils are equal, round, and reactive to light. Left eye exhibits no discharge. No scleral icterus.  Neck: Neck supple. No JVD present. No tracheal deviation present. No thyromegaly present. No adenopathy. Cardiovascular: Normal rate, regular rhythm, normal heart sounds and intact distal pulses.  She does have a grade 2 systolic murmur which she is aware of.  Pulmonary/Chest: Effort normal and breath sounds normal. No respiratory distress. She has no wheezes. She has no rales. She exhibits no tenderness.  Well healed scar upper outer quadrant right breast. Excellent contour, and symmetry. Chronic radiation changes in the breast and inframammary crease. No palpable mass in either  breast. No axillary or supraclavicular adenopathy. No other skin changes.   Musculoskeletal: She exhibits no edema and no tenderness. :  She has no cervical adenopathy.  Neurological: She is alert and oriented to person, place, and time. She exhibits normal muscle tone. Coordination normal.  Skin: Skin is warm. No rash noted. She is not diaphoretic. No erythema. No pallor.  Psychiatric: She has a normal mood and affect. Her behavior is normal. Judgment and thought content normal.     Assessment  Low grade DCIS right breast, upper outer quadrant, receptor positive  No evidence of recurrent cancer 2 years following right partial mastectomy, adjuvant radiation therapy, and ongoing adjuvant tamoxifen   Plan  Continue tamoxifen.  She plans to see Dr. Marcy Panning every 6 months.  Bilateral mammograms in December 2015 We talked about routine surveillance, the number of doctors involved, and her desires. She states that she will see Dr. Humphrey Rolls every 6 months, and will see me on an as-needed basis. As such, she will graduate from my care at this time. I will see her in the future if further problems arise.    Nicole Holland. Nicole Holland, M.D., [facility] General and Minimally invasive Surgery Breast and Colorectal Surgery Office:   [phone] Pager:   [phone]

## 2013-07-22 NOTE — Patient Instructions (Signed)
Your recent mammograms are normal.  You physical exam of your breast and all of the lymph node areas today is normal. There is no evidence of cancer.  Be sure to get annual mammograms  Be sure to keep you appointment with Dr. Marcy Panning every 6 months  We have discussed followup. You have stated that you would like to continue followup with Dr. Serita Kyle, and see me as needed.  Therefore, at this time you will graduate from my care. Return to see me if further problems arise.

## 2014-03-12 ENCOUNTER — Telehealth: Payer: Self-pay | Admitting: Hematology and Oncology

## 2014-03-12 NOTE — Telephone Encounter (Signed)
s/w pt re new appt for lb/vg 12/18 - date per pt schedule mailed.

## 2014-04-18 ENCOUNTER — Encounter (INDEPENDENT_AMBULATORY_CARE_PROVIDER_SITE_OTHER): Payer: Self-pay | Admitting: General Surgery

## 2014-04-28 ENCOUNTER — Ambulatory Visit: Payer: Medicare Other | Admitting: Oncology

## 2014-04-28 ENCOUNTER — Other Ambulatory Visit: Payer: Medicare Other

## 2014-05-24 ENCOUNTER — Other Ambulatory Visit: Payer: Self-pay | Admitting: Hematology and Oncology

## 2014-05-25 ENCOUNTER — Other Ambulatory Visit: Payer: Self-pay | Admitting: *Deleted

## 2014-05-25 DIAGNOSIS — Z853 Personal history of malignant neoplasm of breast: Secondary | ICD-10-CM

## 2014-05-25 DIAGNOSIS — C50419 Malignant neoplasm of upper-outer quadrant of unspecified female breast: Secondary | ICD-10-CM

## 2014-05-25 MED ORDER — TAMOXIFEN CITRATE 20 MG PO TABS: 20.0000 mg | ORAL_TABLET | Freq: Every day | ORAL | Status: DC

## 2014-05-30 ENCOUNTER — Other Ambulatory Visit: Payer: Medicare Other

## 2014-05-30 ENCOUNTER — Ambulatory Visit: Payer: Medicare Other | Admitting: Hematology and Oncology

## 2014-06-02 ENCOUNTER — Telehealth: Payer: Self-pay

## 2014-06-02 DIAGNOSIS — C50419 Malignant neoplasm of upper-outer quadrant of unspecified female breast: Secondary | ICD-10-CM

## 2014-06-02 NOTE — Telephone Encounter (Signed)
Lab orders entered

## 2014-06-03 ENCOUNTER — Other Ambulatory Visit (HOSPITAL_BASED_OUTPATIENT_CLINIC_OR_DEPARTMENT_OTHER): Payer: Medicare Other

## 2014-06-03 ENCOUNTER — Ambulatory Visit (HOSPITAL_BASED_OUTPATIENT_CLINIC_OR_DEPARTMENT_OTHER): Payer: Medicare Other | Admitting: Hematology and Oncology

## 2014-06-03 ENCOUNTER — Telehealth: Payer: Self-pay | Admitting: Hematology and Oncology

## 2014-06-03 VITALS — BP 168/65 | HR 68 | Temp 97.7°F | Resp 18 | Ht <= 58 in | Wt 204.3 lb

## 2014-06-03 DIAGNOSIS — C50419 Malignant neoplasm of upper-outer quadrant of unspecified female breast: Secondary | ICD-10-CM

## 2014-06-03 DIAGNOSIS — D0511 Intraductal carcinoma in situ of right breast: Secondary | ICD-10-CM

## 2014-06-03 DIAGNOSIS — C50411 Malignant neoplasm of upper-outer quadrant of right female breast: Secondary | ICD-10-CM

## 2014-06-03 LAB — COMPREHENSIVE METABOLIC PANEL (CC13)
ALBUMIN: 3.6 g/dL (ref 3.5–5.0)
ALT: 62 U/L — ABNORMAL HIGH (ref 0–55)
AST: 62 U/L — AB (ref 5–34)
Alkaline Phosphatase: 54 U/L (ref 40–150)
Anion Gap: 10 mEq/L (ref 3–11)
BUN: 23.6 mg/dL (ref 7.0–26.0)
CALCIUM: 10.5 mg/dL — AB (ref 8.4–10.4)
CO2: 26 mEq/L (ref 22–29)
Chloride: 103 mEq/L (ref 98–109)
Creatinine: 1.7 mg/dL — ABNORMAL HIGH (ref 0.6–1.1)
EGFR: 34 mL/min/{1.73_m2} — ABNORMAL LOW (ref >90)
Glucose: 105 mg/dl (ref 70–140)
Potassium: 4 mEq/L (ref 3.5–5.1)
Sodium: 140 mEq/L (ref 136–145)
Total Bilirubin: 0.59 mg/dL (ref 0.20–1.20)
Total Protein: 6.9 g/dL (ref 6.4–8.3)

## 2014-06-03 LAB — CBC WITH DIFFERENTIAL/PLATELET
BASO%: 0.3 % (ref 0.0–2.0)
Basophils Absolute: 0 10*3/uL (ref 0.0–0.1)
EOS%: 4 % (ref 0.0–7.0)
Eosinophils Absolute: 0.5 10*3/uL (ref 0.0–0.5)
HCT: 34.8 % (ref 34.8–46.6)
HGB: 12.2 g/dL (ref 11.6–15.9)
LYMPH#: 4.3 10*3/uL — AB (ref 0.9–3.3)
LYMPH%: 35 % (ref 14.0–49.7)
MCH: 28.6 pg (ref 25.1–34.0)
MCHC: 35.1 g/dL (ref 31.5–36.0)
MCV: 81.7 fL (ref 79.5–101.0)
MONO#: 1 10*3/uL — ABNORMAL HIGH (ref 0.1–0.9)
MONO%: 8.3 % (ref 0.0–14.0)
NEUT#: 6.4 10*3/uL (ref 1.5–6.5)
NEUT%: 52.4 % (ref 38.4–76.8)
NRBC: 0 % (ref 0–0)
Platelets: 162 10*3/uL (ref 145–400)
RBC: 4.26 10*6/uL (ref 3.70–5.45)
RDW: 13.5 % (ref 11.2–14.5)
WBC: 12.3 10*3/uL — AB (ref 3.9–10.3)

## 2014-06-03 NOTE — Telephone Encounter (Signed)
per pof to sch pt appt-gave pt copy of sch °

## 2014-06-03 NOTE — Progress Notes (Signed)
Patient Care Team: Foye Spurling, MD as PCP - General (Endocrinology)  DIAGNOSIS: DCIS  PAST THERAPY: History of DCIS status post lumpectomy 08/02/2011, reexcision for margins 09/27/2011, completion of radiation to breast 12/02/2011. She has been on tamoxifen since August 2013.  CHIEF COMPLIANT: Annual follow-up of DCIS on tamoxifen  INTERVAL HISTORY: Nicole Holland is a 78 year old African-American lady with above-mentioned history of DCIS treated with lumpectomy and radiation currently on tamoxifen and tolerating it extremely well without any major problems or concerns. She denies any hot flashes or myalgias. She does have arthritis symptoms. Denies any vaginal bleeding. She had a recent mammogram which was normal.  REVIEW OF SYSTEMS:   Constitutional: Denies fevers, chills or abnormal weight loss Eyes: Denies blurriness of vision Ears, nose, mouth, throat, and face: Denies mucositis or sore throat Respiratory: Denies cough, dyspnea or wheezes Cardiovascular: Denies palpitation, chest discomfort or lower extremity swelling Gastrointestinal:  Denies nausea, heartburn or change in bowel habits Skin: Denies abnormal skin rashes Lymphatics: Denies new lymphadenopathy or easy bruising Neurological:Denies numbness, tingling or new weaknesses Behavioral/Psych: Mood is stable, no new changes  Breast:  denies any pain or lumps or nodules in either breasts All other systems were reviewed with the patient and are negative.  I have reviewed the past medical history, past surgical history, social history and family history with the patient and they are unchanged from previous note.  ALLERGIES:  is allergic to iodides and penicillins.  MEDICATIONS:  Current Outpatient Prescriptions  Medication Sig Dispense Refill  . albuterol (PROAIR HFA) 108 (90 BASE) MCG/ACT inhaler Inhale 2 puffs into the lungs every 6 (six) hours as needed.      Marland Kitchen aspirin EC 81 MG tablet Take 81 mg by mouth daily.    .  brimonidine (ALPHAGAN) 0.2 % ophthalmic solution   0  . budesonide-formoterol (SYMBICORT) 160-4.5 MCG/ACT inhaler Inhale 2 puffs into the lungs 2 (two) times daily.      . calcium carbonate (OS-CAL) 600 MG TABS tablet Take 600 mg by mouth 2 (two) times daily with a meal.    . CARTIA XT 240 MG 24 hr capsule Take 240 mg by mouth daily.  0  . cetirizine (ZYRTEC) 10 MG tablet Take 10 mg by mouth daily.    Marland Kitchen ibuprofen (ADVIL,MOTRIN) 400 MG tablet   0  . ketorolac (ACULAR) 0.5 % ophthalmic solution   0  . latanoprost (XALATAN) 0.005 % ophthalmic solution Place 1 drop into both eyes at bedtime.      . mometasone (NASONEX) 50 MCG/ACT nasal spray Place 2 sprays into the nose daily.      . Multiple Vitamins-Minerals (CENTRUM SILVER PO) Take 1 tablet by mouth daily.     Marland Kitchen ofloxacin (OCUFLOX) 0.3 % ophthalmic solution   0  . Olopatadine HCl (PATADAY) 0.2 % SOLN Apply 1 drop to eye as needed. On drop in each eye as need for  itching    . Omega-3 Fatty Acids (FISH OIL) 1200 MG CAPS Take 1 capsule by mouth 2 (two) times daily.     . ONE TOUCH ULTRA TEST test strip     . ONETOUCH DELICA LANCETS FINE MISC     . Polyethylene Glycol 400 (BLINK TEARS) 0.25 % SOLN Apply 1 drop to eye as needed.     . prednisoLONE acetate (PRED FORTE) 1 % ophthalmic suspension   0  . tamoxifen (NOLVADEX) 20 MG tablet Take 1 tablet (20 mg total) by mouth daily. 90 tablet 0  .  valsartan-hydrochlorothiazide (DIOVAN-HCT) 160-25 MG per tablet Take 1 tablet by mouth daily.    . vitamin C (ASCORBIC ACID) 500 MG tablet Take 500 mg by mouth daily.       No current facility-administered medications for this visit.    PHYSICAL EXAMINATION: ECOG PERFORMANCE STATUS: 1 - Symptomatic but completely ambulatory  Filed Vitals:   06/03/14 1045  BP: 168/65  Pulse: 68  Temp: 97.7 F (36.5 C)  Resp: 18   Filed Weights   06/03/14 1045  Weight: 204 lb 4.8 oz (92.67 kg)    GENERAL:alert, no distress and comfortable SKIN: skin color,  texture, turgor are normal, no rashes or significant lesions EYES: normal, Conjunctiva are pink and non-injected, sclera clear OROPHARYNX:no exudate, no erythema and lips, buccal mucosa, and tongue normal  NECK: supple, thyroid normal size, non-tender, without nodularity LYMPH:  no palpable lymphadenopathy in the cervical, axillary or inguinal LUNGS: clear to auscultation and percussion with normal breathing effort HEART: regular rate & rhythm and no murmurs and no lower extremity edema ABDOMEN:abdomen soft, non-tender and normal bowel sounds Musculoskeletal:no cyanosis of digits and no clubbing  NEURO: alert & oriented x 3 with fluent speech, no focal motor/sensory deficits BREAST: No palpable masses or nodules in either right or left breasts. No palpable axillary supraclavicular or infraclavicular adenopathy no breast tenderness or nipple discharge.   LABORATORY DATA:  I have reviewed the data as listed   Chemistry      Component Value Date/Time   NA 138 04/23/2012 1023   NA 136 09/25/2011 1330   K 3.8 04/23/2012 1023   K 3.8 09/25/2011 1330   CL 103 04/23/2012 1023   CL 99 09/25/2011 1330   CO2 29 04/23/2012 1023   CO2 26 09/25/2011 1330   BUN 28.0* 04/23/2012 1023   BUN 26* 09/25/2011 1330   CREATININE 1.5* 04/23/2012 1023   CREATININE 1.38* 09/25/2011 1330      Component Value Date/Time   CALCIUM 10.3 04/23/2012 1023   CALCIUM 9.8 09/25/2011 1330   ALKPHOS 46 04/23/2012 1023   AST 27 04/23/2012 1023   ALT 34 04/23/2012 1023   BILITOT 0.69 04/23/2012 1023       Lab Results  Component Value Date   WBC 12.3* 06/03/2014   HGB 12.2 06/03/2014   HCT 34.8 06/03/2014   MCV 81.7 06/03/2014   PLT 162 06/03/2014   NEUTROABS 6.4 06/03/2014     RADIOGRAPHIC STUDIES: I have personally reviewed the radiology reports and agreed with their findings. Mammogram 05/25/2014 normal  ASSESSMENT & PLAN:  Malignant neoplasm of upper-outer quadrant of right female breast Right  breast DCIS multifocal 0.3, 0.2, 0.1 cm status post lumpectomy 08/02/2011, reexcision for margins 09/27/2011, completion of radiation to breast 12/02/2011. Tamoxifen since August 2013.  Tamoxifen toxicities: 1. Chronic joint pains I recommended that she should continue the tamoxifen for a total of 5 years which was completed in August 2018. Denies any hot flashes or vaginal bleeding.  Breast cancer surveillance: 1. Breast exam 06/03/2014 normal 2. Mammograms 05/25/14 normal  Return to clinic in 1 year for follow-up.   No orders of the defined types were placed in this encounter.   The patient has a good understanding of the overall plan. she agrees with it. She will call with any problems that may develop before her next visit here.   Rulon Eisenmenger, MD 06/03/2014 11:04 AM

## 2014-06-03 NOTE — Assessment & Plan Note (Addendum)
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 Tamoxifen since August 2013.  Tamoxifen toxicities: 1. Chronic joint pains I recommended that she should continue the tamoxifen for a total of 5 years which was completed in August 2018. Denies any hot flashes or vaginal bleeding.  Breast cancer surveillance: 1. Breast exam 06/03/2014 normal 2. Mammograms 05/25/14 normal  Return to clinic in 1 year for follow-up.

## 2014-06-03 NOTE — Progress Notes (Signed)
Rcvd from [place] dtd 05/21/13 - Dr Marcelo Baldy.  mammo dtd 05/25/14 - Dr Marcelo Baldy.  Copy to Dr Lindi Adie.  Original to scan.

## 2014-08-24 ENCOUNTER — Other Ambulatory Visit: Payer: Self-pay | Admitting: *Deleted

## 2014-08-24 DIAGNOSIS — C50419 Malignant neoplasm of upper-outer quadrant of unspecified female breast: Secondary | ICD-10-CM

## 2014-08-24 DIAGNOSIS — Z853 Personal history of malignant neoplasm of breast: Secondary | ICD-10-CM

## 2014-08-24 MED ORDER — TAMOXIFEN CITRATE 20 MG PO TABS: 20.0000 mg | ORAL_TABLET | Freq: Every day | ORAL | Status: DC

## 2014-08-29 DIAGNOSIS — E780 Pure hypercholesterolemia: Secondary | ICD-10-CM | POA: Diagnosis not present

## 2014-08-29 DIAGNOSIS — I10 Essential (primary) hypertension: Secondary | ICD-10-CM | POA: Diagnosis not present

## 2014-08-29 DIAGNOSIS — J449 Chronic obstructive pulmonary disease, unspecified: Secondary | ICD-10-CM | POA: Diagnosis not present

## 2014-09-14 DIAGNOSIS — E780 Pure hypercholesterolemia: Secondary | ICD-10-CM | POA: Diagnosis not present

## 2014-09-14 DIAGNOSIS — I8312 Varicose veins of left lower extremity with inflammation: Secondary | ICD-10-CM | POA: Diagnosis not present

## 2014-09-14 DIAGNOSIS — I10 Essential (primary) hypertension: Secondary | ICD-10-CM | POA: Diagnosis not present

## 2014-09-14 DIAGNOSIS — J449 Chronic obstructive pulmonary disease, unspecified: Secondary | ICD-10-CM | POA: Diagnosis not present

## 2014-10-05 DIAGNOSIS — J449 Chronic obstructive pulmonary disease, unspecified: Secondary | ICD-10-CM | POA: Diagnosis not present

## 2014-10-05 DIAGNOSIS — E780 Pure hypercholesterolemia: Secondary | ICD-10-CM | POA: Diagnosis not present

## 2014-10-05 DIAGNOSIS — I10 Essential (primary) hypertension: Secondary | ICD-10-CM | POA: Diagnosis not present

## 2014-10-05 DIAGNOSIS — E119 Type 2 diabetes mellitus without complications: Secondary | ICD-10-CM | POA: Diagnosis not present

## 2014-10-05 DIAGNOSIS — M2550 Pain in unspecified joint: Secondary | ICD-10-CM | POA: Diagnosis not present

## 2014-10-05 DIAGNOSIS — E559 Vitamin D deficiency, unspecified: Secondary | ICD-10-CM | POA: Diagnosis not present

## 2014-10-05 DIAGNOSIS — K2970 Gastritis, unspecified, without bleeding: Secondary | ICD-10-CM | POA: Diagnosis not present

## 2014-10-14 DIAGNOSIS — H5711 Ocular pain, right eye: Secondary | ICD-10-CM | POA: Diagnosis not present

## 2014-10-14 DIAGNOSIS — H20041 Secondary noninfectious iridocyclitis, right eye: Secondary | ICD-10-CM | POA: Diagnosis not present

## 2014-10-21 DIAGNOSIS — H5711 Ocular pain, right eye: Secondary | ICD-10-CM | POA: Diagnosis not present

## 2014-10-21 DIAGNOSIS — H20041 Secondary noninfectious iridocyclitis, right eye: Secondary | ICD-10-CM | POA: Diagnosis not present

## 2014-11-02 DIAGNOSIS — I10 Essential (primary) hypertension: Secondary | ICD-10-CM | POA: Diagnosis not present

## 2014-11-02 DIAGNOSIS — E119 Type 2 diabetes mellitus without complications: Secondary | ICD-10-CM | POA: Diagnosis not present

## 2014-11-02 DIAGNOSIS — D649 Anemia, unspecified: Secondary | ICD-10-CM | POA: Diagnosis not present

## 2014-11-02 DIAGNOSIS — J449 Chronic obstructive pulmonary disease, unspecified: Secondary | ICD-10-CM | POA: Diagnosis not present

## 2014-11-02 DIAGNOSIS — E780 Pure hypercholesterolemia: Secondary | ICD-10-CM | POA: Diagnosis not present

## 2014-11-02 DIAGNOSIS — M2550 Pain in unspecified joint: Secondary | ICD-10-CM | POA: Diagnosis not present

## 2014-11-21 ENCOUNTER — Other Ambulatory Visit: Payer: Self-pay | Admitting: Hematology and Oncology

## 2014-12-08 DIAGNOSIS — E780 Pure hypercholesterolemia: Secondary | ICD-10-CM | POA: Diagnosis not present

## 2014-12-08 DIAGNOSIS — M25561 Pain in right knee: Secondary | ICD-10-CM | POA: Diagnosis not present

## 2014-12-08 DIAGNOSIS — J449 Chronic obstructive pulmonary disease, unspecified: Secondary | ICD-10-CM | POA: Diagnosis not present

## 2014-12-08 DIAGNOSIS — I10 Essential (primary) hypertension: Secondary | ICD-10-CM | POA: Diagnosis not present

## 2014-12-14 DIAGNOSIS — E780 Pure hypercholesterolemia: Secondary | ICD-10-CM | POA: Diagnosis not present

## 2014-12-14 DIAGNOSIS — I10 Essential (primary) hypertension: Secondary | ICD-10-CM | POA: Diagnosis not present

## 2014-12-14 DIAGNOSIS — J449 Chronic obstructive pulmonary disease, unspecified: Secondary | ICD-10-CM | POA: Diagnosis not present

## 2015-01-27 DIAGNOSIS — H4011X2 Primary open-angle glaucoma, moderate stage: Secondary | ICD-10-CM | POA: Diagnosis not present

## 2015-02-01 DIAGNOSIS — N189 Chronic kidney disease, unspecified: Secondary | ICD-10-CM | POA: Diagnosis not present

## 2015-02-01 DIAGNOSIS — E780 Pure hypercholesterolemia: Secondary | ICD-10-CM | POA: Diagnosis not present

## 2015-02-01 DIAGNOSIS — I10 Essential (primary) hypertension: Secondary | ICD-10-CM | POA: Diagnosis not present

## 2015-02-01 DIAGNOSIS — J449 Chronic obstructive pulmonary disease, unspecified: Secondary | ICD-10-CM | POA: Diagnosis not present

## 2015-02-01 DIAGNOSIS — M169 Osteoarthritis of hip, unspecified: Secondary | ICD-10-CM | POA: Diagnosis not present

## 2015-02-01 DIAGNOSIS — R233 Spontaneous ecchymoses: Secondary | ICD-10-CM | POA: Diagnosis not present

## 2015-02-19 ENCOUNTER — Other Ambulatory Visit: Payer: Self-pay | Admitting: Hematology and Oncology

## 2015-02-21 NOTE — Telephone Encounter (Signed)
last ov 06/03/14.  Next ov 06/02/15.  Chart reviewed

## 2015-03-02 DIAGNOSIS — H04203 Unspecified epiphora, bilateral lacrimal glands: Secondary | ICD-10-CM | POA: Diagnosis not present

## 2015-03-02 DIAGNOSIS — H35379 Puckering of macula, unspecified eye: Secondary | ICD-10-CM | POA: Diagnosis not present

## 2015-03-02 DIAGNOSIS — H4011X2 Primary open-angle glaucoma, moderate stage: Secondary | ICD-10-CM | POA: Diagnosis not present

## 2015-03-02 DIAGNOSIS — H1013 Acute atopic conjunctivitis, bilateral: Secondary | ICD-10-CM | POA: Diagnosis not present

## 2015-03-08 DIAGNOSIS — Z0000 Encounter for general adult medical examination without abnormal findings: Secondary | ICD-10-CM | POA: Diagnosis not present

## 2015-04-05 DIAGNOSIS — I10 Essential (primary) hypertension: Secondary | ICD-10-CM | POA: Diagnosis not present

## 2015-04-05 DIAGNOSIS — E781 Pure hyperglyceridemia: Secondary | ICD-10-CM | POA: Diagnosis not present

## 2015-04-05 DIAGNOSIS — J449 Chronic obstructive pulmonary disease, unspecified: Secondary | ICD-10-CM | POA: Diagnosis not present

## 2015-05-17 DIAGNOSIS — M150 Primary generalized (osteo)arthritis: Secondary | ICD-10-CM | POA: Diagnosis not present

## 2015-05-17 DIAGNOSIS — J449 Chronic obstructive pulmonary disease, unspecified: Secondary | ICD-10-CM | POA: Diagnosis not present

## 2015-05-17 DIAGNOSIS — E781 Pure hyperglyceridemia: Secondary | ICD-10-CM | POA: Diagnosis not present

## 2015-05-17 DIAGNOSIS — M549 Dorsalgia, unspecified: Secondary | ICD-10-CM | POA: Diagnosis not present

## 2015-05-17 DIAGNOSIS — I10 Essential (primary) hypertension: Secondary | ICD-10-CM | POA: Diagnosis not present

## 2015-06-02 ENCOUNTER — Ambulatory Visit (HOSPITAL_BASED_OUTPATIENT_CLINIC_OR_DEPARTMENT_OTHER): Payer: Medicare Other | Admitting: Hematology and Oncology

## 2015-06-02 ENCOUNTER — Encounter: Payer: Self-pay | Admitting: Hematology and Oncology

## 2015-06-02 VITALS — BP 141/51 | HR 68 | Temp 98.3°F | Resp 18 | Ht <= 58 in | Wt 197.9 lb

## 2015-06-02 DIAGNOSIS — M2550 Pain in unspecified joint: Secondary | ICD-10-CM | POA: Diagnosis not present

## 2015-06-02 DIAGNOSIS — C50411 Malignant neoplasm of upper-outer quadrant of right female breast: Secondary | ICD-10-CM

## 2015-06-02 DIAGNOSIS — D0511 Intraductal carcinoma in situ of right breast: Secondary | ICD-10-CM | POA: Diagnosis not present

## 2015-06-02 NOTE — Assessment & Plan Note (Signed)
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 Tamoxifen since August 2013.  Tamoxifen toxicities: 1. Chronic joint pains I recommended that she should continue the tamoxifen for a total of 5 years which was completed in August 2018. Denies any hot flashes or vaginal bleeding.  Breast cancer surveillance: 1. Breast exam 06/02/2015 normal 2. Mammograms 05/25/14 normal  Return to clinic in 1 year for follow-up.

## 2015-06-02 NOTE — Addendum Note (Signed)
Addended by: Prentiss Bells on: 06/02/2015 01:55 PM   Modules accepted: Orders, Medications

## 2015-06-02 NOTE — Progress Notes (Signed)
Patient Care Team: Foye Spurling, MD as PCP - General (Endocrinology)  DIAGNOSIS: DCIS  PAST THERAPY: History of DCIS status post lumpectomy 08/02/2011, reexcision for margins 09/27/2011, completion of radiation to breast 12/02/2011. She has been on tamoxifen since August 2013.  CHIEF COMPLIANT: Annual follow-up of DCIS on tamoxifen  INTERVAL HISTORY: Nicole Holland is a 79 year old with above-mentioned history of DCIS who underwent lumpectomy radiation and was on tamoxifen. She has tolerated tamoxifen extremely well without any problems or concerns. However she is tired of taking medications and would like to discontinue the current treatment plan. She has not had a mammogram yet.  REVIEW OF SYSTEMS:   Constitutional: Denies fevers, chills or abnormal weight loss Eyes: Denies blurriness of vision Ears, nose, mouth, throat, and face: Denies mucositis or sore throat Respiratory: Denies cough, dyspnea or wheezes Cardiovascular: Denies palpitation, chest discomfort or lower extremity swelling Gastrointestinal:  Denies nausea, heartburn or change in bowel habits Skin: Denies abnormal skin rashes Lymphatics: Denies new lymphadenopathy or easy bruising Neurological:Denies numbness, tingling or new weaknesses Behavioral/Psych: Mood is stable, no new changes  Breast:  denies any pain or lumps or nodules in either breasts All other systems were reviewed with the patient and are negative.  I have reviewed the past medical history, past surgical history, social history and family history with the patient and they are unchanged from previous note.  ALLERGIES:  is allergic to iodides and penicillins.  MEDICATIONS:  Current Outpatient Prescriptions  Medication Sig Dispense Refill  . albuterol (PROAIR HFA) 108 (90 BASE) MCG/ACT inhaler Inhale 2 puffs into the lungs every 6 (six) hours as needed.      Marland Kitchen aspirin EC 81 MG tablet Take 81 mg by mouth daily.    . brimonidine (ALPHAGAN) 0.2 %  ophthalmic solution   0  . budesonide-formoterol (SYMBICORT) 160-4.5 MCG/ACT inhaler Inhale 2 puffs into the lungs 2 (two) times daily.      . calcium carbonate (OS-CAL) 600 MG TABS tablet Take 600 mg by mouth 2 (two) times daily with a meal.    . CARTIA XT 240 MG 24 hr capsule Take 240 mg by mouth daily.  0  . cetirizine (ZYRTEC) 10 MG tablet Take 10 mg by mouth daily.    Marland Kitchen ibuprofen (ADVIL,MOTRIN) 400 MG tablet   0  . ketorolac (ACULAR) 0.5 % ophthalmic solution   0  . latanoprost (XALATAN) 0.005 % ophthalmic solution Place 1 drop into both eyes at bedtime.      . mometasone (NASONEX) 50 MCG/ACT nasal spray Place 2 sprays into the nose daily.      . Multiple Vitamins-Minerals (CENTRUM SILVER PO) Take 1 tablet by mouth daily.     Marland Kitchen ofloxacin (OCUFLOX) 0.3 % ophthalmic solution   0  . Olopatadine HCl (PATADAY) 0.2 % SOLN Apply 1 drop to eye as needed. On drop in each eye as need for  itching    . Omega-3 Fatty Acids (FISH OIL) 1200 MG CAPS Take 1 capsule by mouth 2 (two) times daily.     . ONE TOUCH ULTRA TEST test strip     . ONETOUCH DELICA LANCETS FINE MISC     . Polyethylene Glycol 400 (BLINK TEARS) 0.25 % SOLN Apply 1 drop to eye as needed.     . prednisoLONE acetate (PRED FORTE) 1 % ophthalmic suspension   0  . tamoxifen (NOLVADEX) 20 MG tablet take 1 tablet by mouth once daily 90 tablet 1  . valsartan-hydrochlorothiazide (DIOVAN-HCT) 160-25  MG per tablet Take 1 tablet by mouth daily.    . vitamin C (ASCORBIC ACID) 500 MG tablet Take 500 mg by mouth daily.       No current facility-administered medications for this visit.    PHYSICAL EXAMINATION: ECOG PERFORMANCE STATUS: 1 - Symptomatic but completely ambulatory  Filed Vitals:   06/02/15 1111  BP: 141/51  Pulse: 68  Temp: 98.3 F (36.8 C)  Resp: 18   Filed Weights   06/02/15 1111  Weight: 197 lb 14.4 oz (89.767 kg)    GENERAL:alert, no distress and comfortable SKIN: skin color, texture, turgor are normal, no rashes or  significant lesions EYES: normal, Conjunctiva are pink and non-injected, sclera clear OROPHARYNX:no exudate, no erythema and lips, buccal mucosa, and tongue normal  NECK: supple, thyroid normal size, non-tender, without nodularity LYMPH:  no palpable lymphadenopathy in the cervical, axillary or inguinal LUNGS: clear to auscultation and percussion with normal breathing effort HEART: regular rate & rhythm and no murmurs and no lower extremity edema ABDOMEN:abdomen soft, non-tender and normal bowel sounds Musculoskeletal:no cyanosis of digits and no clubbing  NEURO: alert & oriented x 3 with fluent speech, no focal motor/sensory deficits BREAST: No palpable masses or nodules in either right or left breasts. No palpable axillary supraclavicular or infraclavicular adenopathy no breast tenderness or nipple discharge. (exam performed in the presence of a chaperone)  LABORATORY DATA:  I have reviewed the data as listed   Chemistry      Component Value Date/Time   NA 140 06/03/2014 1001   NA 136 09/25/2011 1330   K 4.0 06/03/2014 1001   K 3.8 09/25/2011 1330   CL 103 04/23/2012 1023   CL 99 09/25/2011 1330   CO2 26 06/03/2014 1001   CO2 26 09/25/2011 1330   BUN 23.6 06/03/2014 1001   BUN 26* 09/25/2011 1330   CREATININE 1.7* 06/03/2014 1001   CREATININE 1.38* 09/25/2011 1330      Component Value Date/Time   CALCIUM 10.5* 06/03/2014 1001   CALCIUM 9.8 09/25/2011 1330   ALKPHOS 54 06/03/2014 1001   AST 62* 06/03/2014 1001   ALT 62* 06/03/2014 1001   BILITOT 0.59 06/03/2014 1001       Lab Results  Component Value Date   WBC 12.3* 06/03/2014   HGB 12.2 06/03/2014   HCT 34.8 06/03/2014   MCV 81.7 06/03/2014   PLT 162 06/03/2014   NEUTROABS 6.4 06/03/2014   ASSESSMENT & PLAN:  Malignant neoplasm of upper-outer quadrant of right female breast 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 Tamoxifen since August 2013.  Tamoxifen toxicities: 1. Chronic joint pains I recommended that she should continue the tamoxifen for a total of 5 years which was completed in August 2018. However patient decided that she wants to stop tamoxifen in March 2017.  Denies any hot flashes or vaginal bleeding.  Breast cancer surveillance: 1. Breast exam 06/02/2015 normal 2. Mammograms 05/25/14 normal Patient wishes to follow with her primary care physician for breast exams. She plans to call for annual mammograms to be done in the next few days.  Return to clinic As needed for follow-ups.  No orders of the defined types were placed in this encounter.   The patient has a good understanding of the overall plan. she agrees with it. she will call with any problems that may develop before the next visit here.   Rulon Eisenmenger,  MD 06/02/2015

## 2015-06-07 DIAGNOSIS — Z1231 Encounter for screening mammogram for malignant neoplasm of breast: Secondary | ICD-10-CM | POA: Diagnosis not present

## 2015-07-07 DIAGNOSIS — H20041 Secondary noninfectious iridocyclitis, right eye: Secondary | ICD-10-CM | POA: Diagnosis not present

## 2015-07-07 DIAGNOSIS — H5711 Ocular pain, right eye: Secondary | ICD-10-CM | POA: Diagnosis not present

## 2015-07-13 DIAGNOSIS — J40 Bronchitis, not specified as acute or chronic: Secondary | ICD-10-CM | POA: Diagnosis not present

## 2015-07-13 DIAGNOSIS — E7800 Pure hypercholesterolemia, unspecified: Secondary | ICD-10-CM | POA: Diagnosis not present

## 2015-07-13 DIAGNOSIS — I10 Essential (primary) hypertension: Secondary | ICD-10-CM | POA: Diagnosis not present

## 2015-07-19 DIAGNOSIS — H20041 Secondary noninfectious iridocyclitis, right eye: Secondary | ICD-10-CM | POA: Diagnosis not present

## 2015-07-19 DIAGNOSIS — H5711 Ocular pain, right eye: Secondary | ICD-10-CM | POA: Diagnosis not present

## 2015-08-28 DIAGNOSIS — I10 Essential (primary) hypertension: Secondary | ICD-10-CM | POA: Diagnosis not present

## 2015-08-28 DIAGNOSIS — E7800 Pure hypercholesterolemia, unspecified: Secondary | ICD-10-CM | POA: Diagnosis not present

## 2015-08-28 DIAGNOSIS — J449 Chronic obstructive pulmonary disease, unspecified: Secondary | ICD-10-CM | POA: Diagnosis not present

## 2015-08-31 DIAGNOSIS — H43813 Vitreous degeneration, bilateral: Secondary | ICD-10-CM | POA: Diagnosis not present

## 2015-08-31 DIAGNOSIS — H02403 Unspecified ptosis of bilateral eyelids: Secondary | ICD-10-CM | POA: Diagnosis not present

## 2015-08-31 DIAGNOSIS — H401112 Primary open-angle glaucoma, right eye, moderate stage: Secondary | ICD-10-CM | POA: Diagnosis not present

## 2015-08-31 DIAGNOSIS — H401122 Primary open-angle glaucoma, left eye, moderate stage: Secondary | ICD-10-CM | POA: Diagnosis not present

## 2015-08-31 DIAGNOSIS — H1013 Acute atopic conjunctivitis, bilateral: Secondary | ICD-10-CM | POA: Diagnosis not present

## 2015-09-03 ENCOUNTER — Observation Stay (HOSPITAL_COMMUNITY)
Admission: EM | Admit: 2015-09-03 | Discharge: 2015-09-04 | Disposition: A | Discharge status: Home / Self Care | Payer: Medicare Other | Attending: Internal Medicine | Admitting: Internal Medicine

## 2015-09-03 ENCOUNTER — Emergency Department (HOSPITAL_COMMUNITY): Payer: Medicare Other

## 2015-09-03 ENCOUNTER — Encounter (HOSPITAL_COMMUNITY): Payer: Self-pay | Admitting: *Deleted

## 2015-09-03 DIAGNOSIS — M19011 Primary osteoarthritis, right shoulder: Secondary | ICD-10-CM | POA: Insufficient documentation

## 2015-09-03 DIAGNOSIS — Z923 Personal history of irradiation: Secondary | ICD-10-CM | POA: Diagnosis not present

## 2015-09-03 DIAGNOSIS — N184 Chronic kidney disease, stage 4 (severe): Secondary | ICD-10-CM | POA: Diagnosis present

## 2015-09-03 DIAGNOSIS — R0789 Other chest pain: Secondary | ICD-10-CM | POA: Diagnosis not present

## 2015-09-03 DIAGNOSIS — Z7982 Long term (current) use of aspirin: Secondary | ICD-10-CM | POA: Diagnosis not present

## 2015-09-03 DIAGNOSIS — I151 Hypertension secondary to other renal disorders: Secondary | ICD-10-CM | POA: Diagnosis not present

## 2015-09-03 DIAGNOSIS — M19012 Primary osteoarthritis, left shoulder: Secondary | ICD-10-CM | POA: Diagnosis not present

## 2015-09-03 DIAGNOSIS — R7989 Other specified abnormal findings of blood chemistry: Secondary | ICD-10-CM

## 2015-09-03 DIAGNOSIS — D649 Anemia, unspecified: Secondary | ICD-10-CM | POA: Diagnosis not present

## 2015-09-03 DIAGNOSIS — J45909 Unspecified asthma, uncomplicated: Secondary | ICD-10-CM | POA: Insufficient documentation

## 2015-09-03 DIAGNOSIS — M109 Gout, unspecified: Secondary | ICD-10-CM | POA: Insufficient documentation

## 2015-09-03 DIAGNOSIS — R791 Abnormal coagulation profile: Secondary | ICD-10-CM | POA: Diagnosis not present

## 2015-09-03 DIAGNOSIS — Z7951 Long term (current) use of inhaled steroids: Secondary | ICD-10-CM | POA: Insufficient documentation

## 2015-09-03 DIAGNOSIS — R079 Chest pain, unspecified: Principal | ICD-10-CM | POA: Insufficient documentation

## 2015-09-03 DIAGNOSIS — R0781 Pleurodynia: Secondary | ICD-10-CM | POA: Diagnosis not present

## 2015-09-03 DIAGNOSIS — R0602 Shortness of breath: Secondary | ICD-10-CM | POA: Diagnosis not present

## 2015-09-03 DIAGNOSIS — Z853 Personal history of malignant neoplasm of breast: Secondary | ICD-10-CM | POA: Diagnosis not present

## 2015-09-03 DIAGNOSIS — E785 Hyperlipidemia, unspecified: Secondary | ICD-10-CM | POA: Diagnosis not present

## 2015-09-03 DIAGNOSIS — Z79899 Other long term (current) drug therapy: Secondary | ICD-10-CM | POA: Diagnosis not present

## 2015-09-03 DIAGNOSIS — I10 Essential (primary) hypertension: Secondary | ICD-10-CM | POA: Diagnosis present

## 2015-09-03 DIAGNOSIS — D72829 Elevated white blood cell count, unspecified: Secondary | ICD-10-CM | POA: Diagnosis not present

## 2015-09-03 DIAGNOSIS — I129 Hypertensive chronic kidney disease with stage 1 through stage 4 chronic kidney disease, or unspecified chronic kidney disease: Secondary | ICD-10-CM | POA: Diagnosis not present

## 2015-09-03 DIAGNOSIS — J449 Chronic obstructive pulmonary disease, unspecified: Secondary | ICD-10-CM | POA: Insufficient documentation

## 2015-09-03 DIAGNOSIS — N2889 Other specified disorders of kidney and ureter: Secondary | ICD-10-CM

## 2015-09-03 LAB — BASIC METABOLIC PANEL
ANION GAP: 12 (ref 5–15)
BUN: 64 mg/dL — AB (ref 6–20)
CHLORIDE: 101 mmol/L (ref 101–111)
CO2: 25 mmol/L (ref 22–32)
Calcium: 10.6 mg/dL — ABNORMAL HIGH (ref 8.9–10.3)
Creatinine, Ser: 2.53 mg/dL — ABNORMAL HIGH (ref 0.44–1.00)
GFR calc Af Amer: 20 mL/min — ABNORMAL LOW (ref >60)
GFR, EST NON AFRICAN AMERICAN: 17 mL/min — AB (ref >60)
GLUCOSE: 113 mg/dL — AB (ref 65–99)
POTASSIUM: 3.8 mmol/L (ref 3.5–5.1)
SODIUM: 138 mmol/L (ref 135–145)

## 2015-09-03 LAB — HEPATIC FUNCTION PANEL
ALBUMIN: 3.5 g/dL (ref 3.5–5.0)
ALK PHOS: 34 U/L — AB (ref 38–126)
ALT: 38 U/L (ref 14–54)
AST: 40 U/L (ref 15–41)
Bilirubin, Direct: 0.2 mg/dL (ref 0.1–0.5)
Indirect Bilirubin: 0.6 mg/dL (ref 0.3–0.9)
TOTAL PROTEIN: 7.5 g/dL (ref 6.5–8.1)
Total Bilirubin: 0.8 mg/dL (ref 0.3–1.2)

## 2015-09-03 LAB — D-DIMER, QUANTITATIVE: D-Dimer, Quant: 7.82 ug/mL-FEU — ABNORMAL HIGH (ref 0.00–0.50)

## 2015-09-03 LAB — CBC
HEMATOCRIT: 31.3 % — AB (ref 36.0–46.0)
HEMOGLOBIN: 10.6 g/dL — AB (ref 12.0–15.0)
MCH: 28.6 pg (ref 26.0–34.0)
MCHC: 33.9 g/dL (ref 30.0–36.0)
MCV: 84.4 fL (ref 78.0–100.0)
Platelets: 233 10*3/uL (ref 150–400)
RBC: 3.71 MIL/uL — ABNORMAL LOW (ref 3.87–5.11)
RDW: 12.8 % (ref 11.5–15.5)
WBC: 13.4 10*3/uL — AB (ref 4.0–10.5)

## 2015-09-03 LAB — I-STAT TROPONIN, ED: Troponin i, poc: 0.01 ng/mL (ref 0.00–0.08)

## 2015-09-03 MED ORDER — HYDROCODONE-ACETAMINOPHEN 5-325 MG PO TABS: 1.0000 | ORAL_TABLET | ORAL | Status: DC | PRN

## 2015-09-03 MED ORDER — ACETAMINOPHEN 325 MG PO TABS: 650.0000 mg | ORAL_TABLET | Freq: Four times a day (QID) | ORAL | Status: DC | PRN

## 2015-09-03 MED ORDER — LATANOPROST 0.005 % OP SOLN
1.0000 [drp] | Freq: Every day | OPHTHALMIC | Status: DC
  Administered 2015-09-03: 1 [drp] via OPHTHALMIC
  Filled 2015-09-03: qty 2.5

## 2015-09-03 MED ORDER — LORATADINE 10 MG PO TABS
10.0000 mg | ORAL_TABLET | Freq: Every day | ORAL | Status: DC
  Administered 2015-09-04: 10 mg via ORAL
  Filled 2015-09-03: qty 1

## 2015-09-03 MED ORDER — SODIUM CHLORIDE 0.9% FLUSH
3.0000 mL | Freq: Two times a day (BID) | INTRAVENOUS | Status: DC
Start: 2015-09-03 — End: 2015-09-04
  Administered 2015-09-03 – 2015-09-04 (×2): 3 mL via INTRAVENOUS

## 2015-09-03 MED ORDER — ONDANSETRON HCL 4 MG/2ML IJ SOLN: 4.0000 mg | Freq: Four times a day (QID) | INTRAMUSCULAR | Status: DC | PRN

## 2015-09-03 MED ORDER — POLYETHYLENE GLYCOL 3350 17 G PO PACK: 17.0000 g | PACK | Freq: Every day | ORAL | Status: DC | PRN

## 2015-09-03 MED ORDER — ENOXAPARIN SODIUM 100 MG/ML ~~LOC~~ SOLN
1.0000 mg/kg | SUBCUTANEOUS | Status: DC
  Administered 2015-09-03: 85 mg via SUBCUTANEOUS
  Filled 2015-09-03 (×2): qty 1

## 2015-09-03 MED ORDER — CARVEDILOL 12.5 MG PO TABS
12.5000 mg | ORAL_TABLET | Freq: Two times a day (BID) | ORAL | Status: DC
  Administered 2015-09-03 – 2015-09-04 (×2): 12.5 mg via ORAL
  Filled 2015-09-03 (×3): qty 1

## 2015-09-03 MED ORDER — ATORVASTATIN CALCIUM 20 MG PO TABS
20.0000 mg | ORAL_TABLET | Freq: Every day | ORAL | Status: DC
  Administered 2015-09-03: 20 mg via ORAL
  Filled 2015-09-03 (×2): qty 1

## 2015-09-03 MED ORDER — ONDANSETRON HCL 4 MG PO TABS: 4.0000 mg | ORAL_TABLET | Freq: Four times a day (QID) | ORAL | Status: DC | PRN

## 2015-09-03 MED ORDER — MOMETASONE FURO-FORMOTEROL FUM 200-5 MCG/ACT IN AERO
2.0000 | INHALATION_SPRAY | Freq: Two times a day (BID) | RESPIRATORY_TRACT | Status: DC
  Filled 2015-09-03: qty 8.8

## 2015-09-03 MED ORDER — FEBUXOSTAT 40 MG PO TABS
40.0000 mg | ORAL_TABLET | Freq: Every day | ORAL | Status: DC
  Administered 2015-09-04: 40 mg via ORAL
  Filled 2015-09-03: qty 1

## 2015-09-03 MED ORDER — ACETAMINOPHEN 650 MG RE SUPP: 650.0000 mg | Freq: Four times a day (QID) | RECTAL | Status: DC | PRN

## 2015-09-03 MED ORDER — SODIUM CHLORIDE 0.9 % IV SOLN
INTRAVENOUS | Status: AC
  Administered 2015-09-03: 23:00:00 via INTRAVENOUS

## 2015-09-03 MED ORDER — ASPIRIN EC 81 MG PO TBEC
81.0000 mg | DELAYED_RELEASE_TABLET | Freq: Every day | ORAL | Status: DC
  Administered 2015-09-04: 81 mg via ORAL
  Filled 2015-09-03: qty 1

## 2015-09-03 NOTE — Progress Notes (Signed)
ANTICOAGULATION CONSULT NOTE - Initial Consult  Pharmacy Consult for Lovenox Indication: rule out VTE  Allergies  Allergen Reactions  . Iodides Itching    All over the body.  . Penicillins Itching    Has patient had a PCN reaction causing immediate rash, facial/tongue/throat swelling, SOB or lightheadedness with hypotension: no-- pt did not have rash but was itchy all over body Has patient had a PCN reaction causing severe rash involving mucus membranes or skin necrosis: no Has patient had a PCN reaction that required hospitalization no Has patient had a PCN reaction occurring within the last 10 years: no If all of the above answers are "NO", then may proceed with Cephalosporin use.     Patient Measurements: Height: 4\' 9"  (144.8 cm) Weight: 190 lb (86.183 kg) IBW/kg (Calculated) : 38.6  Vital Signs: Temp: 98.1 F (36.7 C) (03/19 1638) Temp Source: Oral (03/19 1638) BP: 116/55 mmHg (03/19 2030) Pulse Rate: 73 (03/19 2030)  Labs:  Recent Labs  09/03/15 1655  HGB 10.6*  HCT 31.3*  PLT 233  CREATININE 2.53*    Estimated Creatinine Clearance: 16.7 mL/min (by C-G formula based on Cr of 2.53).   Medical History: Past Medical History  Diagnosis Date  . Hypertension   . Hyperlipidemia   . Arthritis   . Asthma   . Breast cancer ([place]) 08/02/11    right low grade ductal ca in situ upper outer quad   . S/P radiation therapy 10/28/11 - 12/02/11    Right Breast/ 5000cGy/ 25 Fractions  . Vitamin D deficiency   . Use of tamoxifen (Nolvadex) 12/04/11    Dr. Truddie Coco    Medications:  Scheduled:  Infusions:   Assessment: 80 yo to start full dose Lovenox for rule out DVT/PE. Elevated SCr with est CrCl of 17 ml/min. CBC ok  Goal of Therapy:  Heparin level 0.6-1.1 units/ml Monitor platelets by anticoagulation protocol: Yes   Plan:  1) Start Lovenox 1mg /kg SQ q24 for CrCl < 30 ml/min 2) Daily CBC   Adrian Saran, PharmD, BCPS Pager [phone] 09/03/2015 8:59 PM

## 2015-09-03 NOTE — H&P (Signed)
History and Physical  Patient Name: Nicole Holland     [id number]    DOB: Apr 02, 1936    DOA: 09/03/2015 Referring physician: Quintella Reichert, MD PCP: Foye Spurling, MD      Chief Complaint: Chest pain  HPI: Nicole Holland is a 80 y.o. female with a past medical history significant for DCIS off tamox, CKD, and HTN who presents with right sided rib pain.  The patient presents today for a second and more severe episode of right shoulder soreness and right rib pain. A week or more ago she had this discomfort on her right side, saw her PCP, who started lubiprostone, which she has been taking for the first time this week.  Then today while sitting at a table at church, she started to notice right arm "soreness" that then moved down and settled under her right rib cage, causing her to double over with pain. The pain is constant and sharp in character, and associated with flushing, lightheadedness, and perhaps shortness of breath (although was not exertional). There is no nausea, vomiting, diarrhea, hematochezia, syncope, confusion.  No leg swelling or pain.  In the ED, the patient was not tachycardic or hypotensive. She was saturating well on ambient air.  Na 138, K3.8, creatinine 2.5 (baseline unknown), WBC 13.4 K, Hgb 10.6 normocytic. D-dimer was elevated to 7. Chest x-ray was clear. Troponin was negative. ECG showed a PVC, sinus rhythm rate 78, small Q-wave in lead 3, no previous for comparison.  It was not possible to obtain a CT angiogram of the chest because the patient's creatinine, so TRH were asked to admit for observation and VQ scan tomorrow.    Review of Systems:  All other systems negative except as just noted or noted in the history of present illness.  Allergies  Allergen Reactions  . Iodides Itching    All over the body.  . Penicillins Itching        Prior to Admission medications   Medication Sig Start Date End Date Taking? Authorizing Provider  acetaminophen  (TYLENOL) 650 MG CR tablet Take 1,300 mg by mouth every 8 (eight) hours as needed for pain.   Yes Historical Provider, MD  albuterol (PROAIR HFA) 108 (90 BASE) MCG/ACT inhaler Inhale 2 puffs into the lungs every 6 (six) hours as needed for wheezing or shortness of breath.    Yes Historical Provider, MD  aspirin EC 81 MG tablet Take 81 mg by mouth daily.   Yes Historical Provider, MD  atorvastatin (LIPITOR) 20 MG tablet Take 20 mg by mouth at bedtime. 03/28/15  Yes Historical Provider, MD  budesonide-formoterol (SYMBICORT) 160-4.5 MCG/ACT inhaler Inhale 2 puffs into the lungs 2 (two) times daily.     Yes Historical Provider, MD  calcium carbonate (OS-CAL) 600 MG TABS tablet Take 600 mg by mouth 2 (two) times daily with a meal.   Yes Historical Provider, MD  carvedilol (COREG) 6.25 MG tablet Take 12.5 mg by mouth 2 (two) times daily. 04/18/15  Yes Historical Provider, MD  cetirizine (ZYRTEC) 10 MG tablet Take 10 mg by mouth daily.   Yes Historical Provider, MD  furosemide (LASIX) 40 MG tablet Take 40 mg by mouth daily. 08/31/15  Yes Historical Provider, MD  guaiFENesin (MUCINEX) 600 MG 12 hr tablet Take 600 mg by mouth 2 (two) times daily.    Yes Historical Provider, MD  latanoprost (XALATAN) 0.005 % ophthalmic solution Place 1 drop into both eyes at bedtime.     Yes Historical Provider,  MD  mometasone (NASONEX) 50 MCG/ACT nasal spray Place 2 sprays into the nose daily.     Yes Historical Provider, MD  Multiple Vitamins-Minerals (CENTRUM SILVER PO) Take 1 tablet by mouth daily.    Yes Historical Provider, MD  Olopatadine HCl (PATADAY) 0.2 % SOLN Apply 1 drop to eye as needed (itching).    Yes Historical Provider, MD  Omega-3 Fatty Acids (FISH OIL) 1200 MG CAPS Take 1,200 mg by mouth 2 (two) times daily.    Yes Historical Provider, MD  Polyethylene Glycol 400 (BLINK TEARS) 0.25 % SOLN Apply 1 drop to eye as needed (dry eyes).    Yes Historical Provider, MD  ULORIC 40 MG tablet Take 40 mg by mouth daily.   05/01/15  Yes Historical Provider, MD  valsartan-hydrochlorothiazide (DIOVAN-HCT) 160-25 MG per tablet Take 1 tablet by mouth daily.   Yes Historical Provider, MD  vitamin C (ASCORBIC ACID) 500 MG tablet Take 500 mg by mouth daily.     Yes Historical Provider, MD    Past Medical History  Diagnosis Date  . Hypertension   . Hyperlipidemia   . Arthritis   . Asthma   . Breast cancer ([place]) 08/02/11    right low grade ductal ca in situ upper outer quad   . S/P radiation therapy 10/28/11 - 12/02/11    Right Breast/ 5000cGy/ 25 Fractions  . Vitamin D deficiency   . Use of tamoxifen (Nolvadex) 12/04/11    Dr. Truddie Coco    Past Surgical History  Procedure Laterality Date  . Breast surgery  02/01/2000    right breast subareolar core biopsy  . Cesarean section    . Tonsillectomy    . Breast lumpectomy  08/02/11    Lumpectomy right Breast; Low Grade Ductal Carcinoma In Situ with Associated Calcifications, ER 65%, PR 99%  . Breast surgery  09/27/11    Right Breast; Excision of  Anterior Margin- Benign Breast Parenchyma, Negative for In situ and Invasive Cancer, Surgical Margins Negative, Benign skin, Negative for Malignancy  . Abdominal hysterectomy  1994    partial ovaries intact    Family history: family history includes Cancer in her brother, brother, brother, brother, father, and sister; Diabetes in her brother, brother, brother, mother, and sister; Stroke in her mother.  Social History: Patient lives by herself. She does not use cane or walker. She is independent with all ADLs and IADLs.  She is not a smoker     Physical Exam: BP 123/61 mmHg  Pulse 71  Temp(Src) 98.1 F (36.7 C) (Oral)  Resp 14  Ht 4\' 9"  (1.448 m)  Wt 86.183 kg (190 lb)  BMI 41.10 kg/m2  SpO2 99% General appearance: Well-developed, obese adult female, alert and in no acute distress.   Eyes: Anicteric, conjunctiva pink, lids and lashes normal. Left exotropia at rest.  ENT: No nasal deformity, discharge, or epistaxis.   OP moist without lesions.   Lymph: No cervical or supraclavicular lymphadenopathy. Skin: Warm and dry.   Cardiac: RRR, nl S1-S2, no murmurs appreciated.  Capillary refill is brisk.  No JVD.  No pitting LE edema, seems L leg is more swollen compared to right, without pain.   Respiratory: Normal respiratory rate and rhythm.  Some coarse breath sounds at bases. Abdomen: Abdomen soft without rigidity.  No TTP. No ascites, distension.   MSK: No deformities or effusions. Neuro: Sensorium intact and responding to questions, attention normal.  Speech is fluent.  Moves all extremities equally and with normal coordination.  Psych: Behavior appropriate.  Affect normal.  No evidence of aural or visual hallucinations or delusions.       Labs on Admission:  The metabolic panel shows elevated BUN to creatinine ratio, elevated creatinine, baseline unknown. Anion gap normal. Transaminases and bilirubin are normal. D-dimer is elevated. Troponin is negative. The complete blood count shows leukocytosis and normocytic anemia.   Radiological Exams on Admission: Personally reviewed: Dg Chest 2 View  09/03/2015  CLINICAL DATA:  Central chest pain today with shortness of breath and right shoulder pain EXAM: CHEST  2 VIEW COMPARISON:  08/01/2011 FINDINGS: The heart size and vascular pattern are normal. There is aortic arch calcification. No consolidation or effusion. Bilateral shoulder arthritis. IMPRESSION: No active cardiopulmonary disease. Electronically Signed   By: Skipper Cliche M.D.   On: 09/03/2015 18:01    EKG: Independently reviewed. Sinus rhythm, rate 78. QTC normal. Q wave in lead 3, no previous for comparison, no ST changes.    Assessment/Plan 1. Right rib pain:  This is new.  ACS is doubted. There is no pleural effusion on chest x-ray. The differential includes PE, IBS, constipation.   -Recommend observation overnight, and VQ scan tomorrow  -lower extremity ultrasound duplex  ordered -Empiric Lovenox until VQ scan   2. Leukocytosis:  Unclear etiology -Repeat CBC -Culture if fever  3. HTN:  Normotensive at admission. -Hold ARB and furosemide until kidney function clarified -Continue carvedilol and aspirin  4. CKD stage IV versus AKI:  Baseline not clear. -Small fluid challenge -Repeat BMP -Check urinalysis (can't check urine LITES because on furosemide)  5. COPD:  Stable. No wheezes -Continue home Symbicort  6. Gout:  -Continue home Uloric  7. Chronic normocytic anemia:  Unknown baseline, presumed from renal disease  8. Breast cancer: Patient and her oncologist recently stopped Tamoxifen as of this month    DVT PPx: Lovenox Diet: Regular Consultants: None Code Status: FULL Family Communication: Son, present at the bedside, all questions answered.  CODE STATUS confirmed.  Medical decision making: What exists of the patient's previous chart was reviewed in depth and the case was discussed with Dr. Ralene Bathe. Patient seen 9:44 PM on 09/03/2015.  Disposition Plan:  I recommend admission to telemetry for observation of possible PE.  Clinical condition: stable.  Anticipate VQ scan in the morning, and discharge if normal      Edwin Dada [facility] Pager [phone]

## 2015-09-03 NOTE — ED Provider Notes (Signed)
CSN: [id number]     Arrival date & time 09/03/15  1628 History   First MD Initiated Contact with Patient 09/03/15 1853     Chief Complaint  Patient presents with  . Chest Pain     Patient is a 80 y.o. female presenting with chest pain. The history is provided by the patient. No language interpreter was used.  Chest Pain  Nicole Holland is a 80 y.o. female who presents to the Emergency Department complaining of chest pain.  She reports right sided chest/shoulder pain.  Her pain started about 1 pm today and is described as a dull sensation with significant discomfort.  She has some associated SOB.  She denies fever, cough, abdominal pain, nausea, vomiting.  Last night she had a spell of diaphoresis, now gone.  She had a similar episode one week ago that resolved.  She has a hx/o breast cancer, now thought to be in remission, she just finished a course of tamoxifen 08/20/15.  Sxs are moderate, waxing and waning, improving.  She has no current sxs in the ED.    Past Medical History  Diagnosis Date  . Hypertension   . Hyperlipidemia   . Arthritis   . Asthma   . 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 S/P radiation therapy 10/28/11 - 12/02/11    Right Breast/ 5000cGy/ 25 Fractions  . Vitamin D deficiency   . Use of tamoxifen (Nolvadex) 12/04/11    Dr. Truddie Coco   Past Surgical History  Procedure Laterality Date  . Breast surgery  02/01/2000    right breast subareolar core biopsy  . Cesarean section    . Tonsillectomy    . Breast lumpectomy  08/02/11    Lumpectomy right Breast; Low Grade Ductal Carcinoma In Situ with Associated Calcifications, ER 65%, PR 99%  . Breast surgery  09/27/11    Right Breast; Excision of  Anterior Margin- Benign Breast Parenchyma, Negative for In situ and Invasive Cancer, Surgical Margins Negative, Benign skin, Negative for Malignancy  . Abdominal hysterectomy  1994    partial ovaries intact   Family History  Problem  Relation Age of Onset  . Stroke Mother   . Diabetes Mother   . Cancer Father     Lung cancer  . Diabetes Sister   . Cancer Sister     Unknown Cancer  . Diabetes Brother   . Cancer Brother     Unknown cancer  . Diabetes Brother   . Cancer Brother     Unknown cancer  . Cancer Brother     Unknown Cancer  . Diabetes Brother   . Cancer Brother     Unknown Cancer   Social History  Substance Use Topics  . Smoking status: Never Smoker   . Smokeless tobacco: Never Used  . Alcohol Use: No   OB History    Gravida Para Term Preterm AB TAB SAB Ectopic Multiple Living   5 4              Obstetric Comments   G5, P4,. No HRT     Review of Systems  Cardiovascular: Positive for chest pain.  All other systems reviewed and are negative.     Allergies  Iodides and Penicillins  Home Medications   Prior to Admission medications   Medication Sig Start Date End Date Taking? Authorizing Provider  acetaminophen (TYLENOL) 650 MG CR tablet Take 1,300 mg by  mouth every 8 (eight) hours as needed for pain.   Yes Historical Provider, MD  albuterol (PROAIR HFA) 108 (90 BASE) MCG/ACT inhaler Inhale 2 puffs into the lungs every 6 (six) hours as needed for wheezing or shortness of breath.    Yes Historical Provider, MD  aspirin EC 81 MG tablet Take 81 mg by mouth daily.   Yes Historical Provider, MD  atorvastatin (LIPITOR) 20 MG tablet Take 20 mg by mouth at bedtime. 03/28/15  Yes Historical Provider, MD  budesonide-formoterol (SYMBICORT) 160-4.5 MCG/ACT inhaler Inhale 2 puffs into the lungs 2 (two) times daily.     Yes Historical Provider, MD  calcium carbonate (OS-CAL) 600 MG TABS tablet Take 600 mg by mouth 2 (two) times daily with a meal.   Yes Historical Provider, MD  carvedilol (COREG) 6.25 MG tablet Take 12.5 mg by mouth 2 (two) times daily. 04/18/15  Yes Historical Provider, MD  cetirizine (ZYRTEC) 10 MG tablet Take 10 mg by mouth daily.   Yes Historical Provider, MD  furosemide (LASIX) 40  MG tablet Take 40 mg by mouth daily. 08/31/15  Yes Historical Provider, MD  guaiFENesin (MUCINEX) 600 MG 12 hr tablet Take 600 mg by mouth 2 (two) times daily.    Yes Historical Provider, MD  latanoprost (XALATAN) 0.005 % ophthalmic solution Place 1 drop into both eyes at bedtime.     Yes Historical Provider, MD  mometasone (NASONEX) 50 MCG/ACT nasal spray Place 2 sprays into the nose daily.     Yes Historical Provider, MD  Multiple Vitamins-Minerals (CENTRUM SILVER PO) Take 1 tablet by mouth daily.    Yes Historical Provider, MD  Olopatadine HCl (PATADAY) 0.2 % SOLN Apply 1 drop to eye as needed (itching).    Yes Historical Provider, MD  Omega-3 Fatty Acids (FISH OIL) 1200 MG CAPS Take 1,200 mg by mouth 2 (two) times daily.    Yes Historical Provider, MD  Polyethylene Glycol 400 (BLINK TEARS) 0.25 % SOLN Apply 1 drop to eye as needed (dry eyes).    Yes Historical Provider, MD  ULORIC 40 MG tablet Take 40 mg by mouth daily.  05/01/15  Yes Historical Provider, MD  valsartan-hydrochlorothiazide (DIOVAN-HCT) 160-25 MG per tablet Take 1 tablet by mouth daily.   Yes Historical Provider, MD  vitamin C (ASCORBIC ACID) 500 MG tablet Take 500 mg by mouth daily.     Yes Historical Provider, MD   BP 134/55 mmHg  Pulse 72  Temp(Src) 98 F (36.7 C) (Oral)  Resp 20  Ht 4\' 9"  (1.448 m)  Wt 183 lb 13.8 oz (83.4 kg)  BMI 39.78 kg/m2  SpO2 100% Physical Exam  Constitutional: She is oriented to person, place, and time. She appears well-developed and well-nourished.  HENT:  Head: Normocephalic and atraumatic.  Cardiovascular: Normal rate and regular rhythm.   No murmur heard. Pulmonary/Chest: Effort normal and breath sounds normal. No respiratory distress. She exhibits no tenderness.  Abdominal: Soft. There is no tenderness. There is no rebound and no guarding.  Musculoskeletal: She exhibits no tenderness.  1-2+ nonpitting edema in BLE  Neurological: She is alert and oriented to person, place, and time.   Skin: Skin is warm and dry.  Psychiatric: She has a normal mood and affect. Her behavior is normal.  Nursing note and vitals reviewed.   ED Course  Procedures (including critical care time) Labs Review Labs Reviewed  BASIC METABOLIC PANEL - Abnormal; Notable for the following:    Glucose, Bld 113 (*)  BUN 64 (*)    Creatinine, Ser 2.53 (*)    Calcium 10.6 (*)    GFR calc non Af Amer 17 (*)    GFR calc Af Amer 20 (*)    All other components within normal limits  CBC - Abnormal; Notable for the following:    WBC 13.4 (*)    RBC 3.71 (*)    Hemoglobin 10.6 (*)    HCT 31.3 (*)    All other components within normal limits  HEPATIC FUNCTION PANEL - Abnormal; Notable for the following:    Alkaline Phosphatase 34 (*)    All other components within normal limits  D-DIMER, QUANTITATIVE (NOT AT [facility]) - Abnormal; Notable for the following:    D-Dimer, Quant 7.82 (*)    All other components within normal limits  URINALYSIS, ROUTINE W REFLEX MICROSCOPIC (NOT AT [facility]) - Abnormal; Notable for the following:    APPearance CLOUDY (*)    Leukocytes, UA SMALL (*)    All other components within normal limits  URINE MICROSCOPIC-ADD ON - Abnormal; Notable for the following:    Squamous Epithelial / LPF 6-30 (*)    Bacteria, UA RARE (*)    All other components within normal limits  BASIC METABOLIC PANEL  CBC  I-STAT TROPOININ, ED    Imaging Review Dg Chest 2 View  09/03/2015  CLINICAL DATA:  Central chest pain today with shortness of breath and right shoulder pain EXAM: CHEST  2 VIEW COMPARISON:  08/01/2011 FINDINGS: The heart size and vascular pattern are normal. There is aortic arch calcification. No consolidation or effusion. Bilateral shoulder arthritis. IMPRESSION: No active cardiopulmonary disease. Electronically Signed   By: Skipper Cliche M.D.   On: 09/03/2015 18:01   I have personally reviewed and evaluated these images and lab results as part of my medical decision-making.    EKG Interpretation   Date/Time:  Sunday September 03 2015 16:34:51 EDT Ventricular Rate:  78 PR Interval:  129 QRS Duration: 90 QT Interval:  397 QTC Calculation: 452 R Axis:   64 Text Interpretation:  Sinus rhythm Ventricular premature complex Confirmed  by Hazle Coca [phone]) on 09/03/2015 5:26:05 PM      MDM   Final diagnoses:  Chest pain  Elevated d-dimer    Patient here for evaluation of right-sided chest pain. She does have some chronic kidney disease, creatinine and BUN are elevated compared to priors. EKG with no acute ischemic changes. D-dimer is elevated. Plan to admit for observation for her chest pain and cycles components, VQ scan to rule out PE. Treating with Lovenox empirically until VQ scan results are available. Hospitalist consulted for admission.    Quintella Reichert, MD 09/04/15 [phone]

## 2015-09-03 NOTE — ED Notes (Signed)
Pt stated "after church I started having right shoulder pain, it moved down into my rib cage and into my waist.  I felt lightheaded & was sweaty last night when I got out of the shower.  I took my little pill (Amitiza) that he just gave me."  Pt denies n/v but c/o SOB

## 2015-09-04 ENCOUNTER — Observation Stay (HOSPITAL_COMMUNITY): Payer: Medicare Other

## 2015-09-04 ENCOUNTER — Observation Stay (HOSPITAL_BASED_OUTPATIENT_CLINIC_OR_DEPARTMENT_OTHER): Payer: Medicare Other

## 2015-09-04 DIAGNOSIS — I10 Essential (primary) hypertension: Secondary | ICD-10-CM | POA: Diagnosis not present

## 2015-09-04 DIAGNOSIS — N184 Chronic kidney disease, stage 4 (severe): Secondary | ICD-10-CM

## 2015-09-04 DIAGNOSIS — R791 Abnormal coagulation profile: Secondary | ICD-10-CM | POA: Diagnosis not present

## 2015-09-04 DIAGNOSIS — R079 Chest pain, unspecified: Secondary | ICD-10-CM | POA: Diagnosis not present

## 2015-09-04 DIAGNOSIS — R0789 Other chest pain: Secondary | ICD-10-CM | POA: Diagnosis not present

## 2015-09-04 LAB — URINALYSIS, ROUTINE W REFLEX MICROSCOPIC
Bilirubin Urine: NEGATIVE
GLUCOSE, UA: NEGATIVE mg/dL
HGB URINE DIPSTICK: NEGATIVE
Ketones, ur: NEGATIVE mg/dL
Nitrite: NEGATIVE
PH: 7 (ref 5.0–8.0)
Protein, ur: NEGATIVE mg/dL
SPECIFIC GRAVITY, URINE: 1.009 (ref 1.005–1.030)

## 2015-09-04 LAB — CBC
HEMATOCRIT: 28 % — AB (ref 36.0–46.0)
Hemoglobin: 9.5 g/dL — ABNORMAL LOW (ref 12.0–15.0)
MCH: 28.7 pg (ref 26.0–34.0)
MCHC: 33.9 g/dL (ref 30.0–36.0)
MCV: 84.6 fL (ref 78.0–100.0)
PLATELETS: 227 10*3/uL (ref 150–400)
RBC: 3.31 MIL/uL — AB (ref 3.87–5.11)
RDW: 12.8 % (ref 11.5–15.5)
WBC: 12.2 10*3/uL — ABNORMAL HIGH (ref 4.0–10.5)

## 2015-09-04 LAB — BASIC METABOLIC PANEL
Anion gap: 10 (ref 5–15)
BUN: 59 mg/dL — ABNORMAL HIGH (ref 6–20)
CHLORIDE: 105 mmol/L (ref 101–111)
CO2: 25 mmol/L (ref 22–32)
CREATININE: 2.5 mg/dL — AB (ref 0.44–1.00)
Calcium: 9.4 mg/dL (ref 8.9–10.3)
GFR calc non Af Amer: 17 mL/min — ABNORMAL LOW (ref >60)
GFR, EST AFRICAN AMERICAN: 20 mL/min — AB (ref >60)
Glucose, Bld: 74 mg/dL (ref 65–99)
POTASSIUM: 3.8 mmol/L (ref 3.5–5.1)
Sodium: 140 mmol/L (ref 135–145)

## 2015-09-04 LAB — URINE MICROSCOPIC-ADD ON: RBC / HPF: NONE SEEN RBC/hpf (ref 0–5)

## 2015-09-04 MED ORDER — TECHNETIUM TC 99M DIETHYLENETRIAME-PENTAACETIC ACID: 30.0000 | Freq: Once | INTRAVENOUS | Status: DC | PRN

## 2015-09-04 MED ORDER — ENOXAPARIN SODIUM 30 MG/0.3ML ~~LOC~~ SOLN
30.0000 mg | SUBCUTANEOUS | Status: DC
  Filled 2015-09-04: qty 0.3

## 2015-09-04 MED ORDER — TECHNETIUM TO 99M ALBUMIN AGGREGATED: 4.0000 | Freq: Once | INTRAVENOUS | Status: DC | PRN

## 2015-09-04 NOTE — Progress Notes (Signed)
VASCULAR LAB PRELIMINARY  PRELIMINARY  PRELIMINARY  PRELIMINARY  Left lower extremity venous duplex  completed.    Preliminary report:  Left:  No evidence of DVT, superficial thrombosis, or Baker's cyst.  Brock Larmon, RVT 09/04/2015, 9:17 AM

## 2015-09-04 NOTE — Progress Notes (Signed)
[facility] PROGRESS NOTE  Nicole Holland [id number] DOB: 02-22-1936 DOA: 09/03/2015 PCP: Nicole Spurling, MD  Assessment/Plan: 1. Right chest wall pain. -She presents with pain located over her right chest. She denies chest pain precipitated by deep inspiration or cough and her pain was not reproducible to palpation on physical exam. -Troponin was undetectable. -EKG did not reveal acute ischemic changes. -Labs did reveal an elevated d-dimer of 7.82. She is not hypoxemic, tachycardic or dyspneic -Plan for further workup with VQ scan -A V/Q negative pain symptoms could be related to musculoskeletal versus reflux  2.  Stage IV chronic kidney disease versus acute on chronic renal failure -Labs showing creatinine of 2.5. Previous labs revealed creatinine of 1.7 in 2015. This may represent progression of underlying chronic kidney disease.  3.  Hypertension. -Blood pressures stable, continue Coreg 12.5 mg by mouth twice a day  4.  History of breast cancer. -Daily receiving her care at the Cancer center -Status post lumpectomy and radiation to breast -She expressed wishes to stop Tamoxifen in December 2016   Code Status: Full code Family Communication: No family at bedside Disposition Plan: Anticipate discharge in the next 24 hours    HPI/Subjective: Nicole Holland is a pleasant 80 year old female with a past medical history of breast cancer status post lumpectomy and radiation therapy, receiving her care at the [facility], presented to the emergency department on 09/03/2015 with complaints of right-sided chest wall pain. Reports having his symptoms over the past week. Initial workup in the emergency department included a chest x-ray which did not reveal acute cardiopulmonary disease. Lab work did reveal an elevated d-dimer at 7.82 for which a VQ scan was ordered. CT angiogram could not be done due to renal function.  Objective: Filed Vitals:   09/03/15 2153 09/04/15 0553   BP: 134/55 120/48  Pulse: 72 77  Temp: 98 F (36.7 C) 98.3 F (36.8 C)  Resp: 20 20   No intake or output data in the 24 hours ending 09/04/15 0658 Filed Weights   09/03/15 1638 09/03/15 2153  Weight: 86.183 kg (190 lb) 83.4 kg (183 lb 13.8 oz)    Exam:   General:  Nontoxic-appearing she is awake and alert  Cardiovascular: Regular rate and rhythm normal S1-S2  Respiratory: Normal respiratory effort  Abdomen: Soft nontender as  Musculoskeletal: No edema, there was no pain to palpation over right sided thoracic wall  Data Reviewed: Basic Metabolic Panel:  Recent Labs Lab 09/03/15 1655 09/04/15 0527  NA 138 140  K 3.8 3.8  CL 101 105  CO2 25 25  GLUCOSE 113* 74  BUN 64* 59*  CREATININE 2.53* 2.50*  CALCIUM 10.6* 9.4   Liver Function Tests:  Recent Labs Lab 09/03/15 1655  AST 40  ALT 38  ALKPHOS 34*  BILITOT 0.8  PROT 7.5  ALBUMIN 3.5   No results for input(s): LIPASE, AMYLASE in the last 168 hours. No results for input(s): AMMONIA in the last 168 hours. CBC:  Recent Labs Lab 09/03/15 1655 09/04/15 0527  WBC 13.4* 12.2*  HGB 10.6* 9.5*  HCT 31.3* 28.0*  MCV 84.4 84.6  PLT 233 227   Cardiac Enzymes: No results for input(s): CKTOTAL, CKMB, CKMBINDEX, TROPONINI in the last 168 hours. BNP (last 3 results) No results for input(s): BNP in the last 8760 hours.  ProBNP (last 3 results) No results for input(s): PROBNP in the last 8760 hours.  CBG: No results for input(s): GLUCAP in the last 168 hours.  No  results found for this or any previous visit (from the past 240 hour(s)).   Studies: Dg Chest 2 View  09/03/2015  CLINICAL DATA:  Central chest pain today with shortness of breath and right shoulder pain EXAM: CHEST  2 VIEW COMPARISON:  08/01/2011 FINDINGS: The heart size and vascular pattern are normal. There is aortic arch calcification. No consolidation or effusion. Bilateral shoulder arthritis. IMPRESSION: No active cardiopulmonary disease.  Electronically Signed   By: Nicole Holland M.D.   On: 09/03/2015 18:01    Scheduled Meds: . aspirin EC  81 mg Oral Daily  . atorvastatin  20 mg Oral QHS  . carvedilol  12.5 mg Oral BID  . enoxaparin (LOVENOX) injection  1 mg/kg Subcutaneous Q24H  . febuxostat  40 mg Oral Daily  . latanoprost  1 drop Both Eyes QHS  . loratadine  10 mg Oral Daily  . mometasone-formoterol  2 puff Inhalation BID  . sodium chloride flush  3 mL Intravenous Q12H   Continuous Infusions:   Principal Problem:   Chest pain Active Problems:   Hypertension   CKD (chronic kidney disease), stage IV (HCC)   Normocytic anemia   Leukocytosis    Time spent: 30 min    Nicole Holland  [facility] Pager [phone]. If 7PM-7AM, please contact night-coverage at [email or web address], password [facility] 09/04/2015, 6:58 AM

## 2015-09-04 NOTE — Discharge Summary (Addendum)
Physician Discharge Summary  JULEE TUY [id number] DOB: Apr 25, 1936 DOA: 09/03/2015  PCP: Foye Spurling, MD  Admit date: 09/03/2015 Discharge date: 09/04/2015  Time spent: 35 minutes  Recommendations for Outpatient Follow-up:  1. Please follow-up on blood pressures, her blood pressure during this hospitalization were in the low normal range, valsartan/hydrochlorothiazide held on discharge. She was continued on Coreg. 2. Please follow-up on CBC on hospital follow-up visit, had white count of 12,200 during this hospitalization. 3. Please follow-up on BMP on hospital follow-up visit, has history chronic kidney disease, having creatinine of 2.5 on day of discharge.    Discharge Diagnoses:  Principal Problem:   Chest pain Active Problems:   Hypertension   CKD (chronic kidney disease), stage IV (HCC)   Normocytic anemia   Leukocytosis   Discharge Condition: Stable  Diet recommendation: Regular diet  Filed Weights   09/03/15 1638 09/03/15 2153  Weight: 86.183 kg (190 lb) 83.4 kg (183 lb 13.8 oz)    History of present illness:  Nicole Holland is a 80 y.o. female with a past medical history significant for DCIS off tamox, CKD, and HTN who presents with right sided rib pain.  The patient presents today for a second and more severe episode of right shoulder soreness and right rib pain. A week or more ago she had this discomfort on her right side, saw her PCP, who started lubiprostone, which she has been taking for the first time this week.  Then today while sitting at a table at church, she started to notice right arm "soreness" that then moved down and settled under her right rib cage, causing her to double over with pain. The pain is constant and sharp in character, and associated with flushing, lightheadedness, and perhaps shortness of breath (although was not exertional). There is no nausea, vomiting, diarrhea, hematochezia, syncope, confusion. No leg swelling or pain.  In  the ED, the patient was not tachycardic or hypotensive. She was saturating well on ambient air. Na 138, K3.8, creatinine 2.5 (baseline unknown), WBC 13.4 K, Hgb 10.6 normocytic. D-dimer was elevated to 7. Chest x-ray was clear. Troponin was negative. ECG showed a PVC, sinus rhythm rate 78, small Q-wave in lead 3, no previous for comparison.  It was not possible to obtain a CT angiogram of the chest because the patient's creatinine, so TRH were asked to admit for observation and VQ scan tomorrow.  Hospital Course:  [name] is a pleasant 80 year old female with a past medical history of breast cancer status post lumpectomy and radiation therapy, receiving her care at the [facility], presented to the emergency department on 09/03/2015 with complaints of right-sided chest wall pain. Reports having his symptoms over the past week. Initial workup in the emergency department included a chest x-ray which did not reveal acute cardiopulmonary disease. Lab work did reveal an elevated d-dimer at 7.82 for which a VQ scan was ordered. CT angiogram could not be done due to renal function. VQ scan showed low probability for pulmonary embolism. Lower extremity Doppler did not reveal evidence of DVT. She reported improvement to her chest pain symptoms as I feel this is likely related to costochondritis. Blood pressures were on the low normal range for which valsartan/HCTZ held on discharge. Or egg was continued. Please follow-up on blood pressures on hospital follow-up visit.  Procedures:  VQ scan IMPRESSION: Very low probability for pulmonary embolism.   Discharge Exam: Filed Vitals:   09/04/15 0553 09/04/15 1336  BP: 120/48 102/50  Pulse:  77 73  Temp: 98.3 F (36.8 C) 97.9 F (36.6 C)  Resp: 20 20     General: Nontoxic-appearing she is awake and alert  Cardiovascular: Regular rate and rhythm normal S1-S2  Respiratory: Normal respiratory effort  Abdomen: Soft nontender as  Musculoskeletal:  No edema, there was no pain to palpation over right sided thoracic wall  Discharge Instructions   Discharge Instructions    Call MD for:  difficulty breathing, headache or visual disturbances    Complete by:  As directed      Call MD for:  extreme fatigue    Complete by:  As directed      Call MD for:  hives    Complete by:  As directed      Call MD for:  persistant dizziness or light-headedness    Complete by:  As directed      Call MD for:  persistant nausea and vomiting    Complete by:  As directed      Call MD for:  redness, tenderness, or signs of infection (pain, swelling, redness, odor or green/yellow discharge around incision site)    Complete by:  As directed      Call MD for:  severe uncontrolled pain    Complete by:  As directed      Call MD for:  temperature >100.4    Complete by:  As directed      Call MD for:    Complete by:  As directed      Diet - low sodium heart healthy    Complete by:  As directed      Increase activity slowly    Complete by:  As directed           Current Discharge Medication List    CONTINUE these medications which have NOT CHANGED   Details  acetaminophen (TYLENOL) 650 MG CR tablet Take 1,300 mg by mouth every 8 (eight) hours as needed for pain.    albuterol (PROAIR HFA) 108 (90 BASE) MCG/ACT inhaler Inhale 2 puffs into the lungs every 6 (six) hours as needed for wheezing or shortness of breath.     aspirin EC 81 MG tablet Take 81 mg by mouth daily.    atorvastatin (LIPITOR) 20 MG tablet Take 20 mg by mouth at bedtime. Refills: 0    budesonide-formoterol (SYMBICORT) 160-4.5 MCG/ACT inhaler Inhale 2 puffs into the lungs 2 (two) times daily.      calcium carbonate (OS-CAL) 600 MG TABS tablet Take 600 mg by mouth 2 (two) times daily with a meal.   Associated Diagnoses: Malignant neoplasm of upper-outer quadrant of female breast, unspecified laterality ([place]); History of breast cancer    carvedilol (COREG) 6.25 MG tablet Take 12.5 mg  by mouth 2 (two) times daily. Refills: 0    cetirizine (ZYRTEC) 10 MG tablet Take 10 mg by mouth daily.   Associated Diagnoses: Malignant neoplasm of upper-outer quadrant of female breast, unspecified laterality ([place]); History of breast cancer    furosemide (LASIX) 40 MG tablet Take 40 mg by mouth daily. Refills: 0    guaiFENesin (MUCINEX) 600 MG 12 hr tablet Take 600 mg by mouth 2 (two) times daily.     latanoprost (XALATAN) 0.005 % ophthalmic solution Place 1 drop into both eyes at bedtime.      mometasone (NASONEX) 50 MCG/ACT nasal spray Place 2 sprays into the nose daily.      Multiple Vitamins-Minerals (CENTRUM SILVER PO) Take 1 tablet by mouth daily.  Olopatadine HCl (PATADAY) 0.2 % SOLN Apply 1 drop to eye as needed (itching).    Associated Diagnoses: Malignant neoplasm of upper-outer quadrant of female breast, unspecified laterality ([place]); History of breast cancer    Omega-3 Fatty Acids (FISH OIL) 1200 MG CAPS Take 1,200 mg by mouth 2 (two) times daily.     Polyethylene Glycol 400 (BLINK TEARS) 0.25 % SOLN Apply 1 drop to eye as needed (dry eyes).     ULORIC 40 MG tablet Take 40 mg by mouth daily.  Refills: 0    vitamin C (ASCORBIC ACID) 500 MG tablet Take 500 mg by mouth daily.        STOP taking these medications     valsartan-hydrochlorothiazide (DIOVAN-HCT) 160-25 MG per tablet        Allergies  Allergen Reactions  . Iodides Itching    All over the body.  . Penicillins Itching    Has patient had a PCN reaction causing immediate rash, facial/tongue/throat swelling, SOB or lightheadedness with hypotension: no-- pt did not have rash but was itchy all over body Has patient had a PCN reaction causing severe rash involving mucus membranes or skin necrosis: no Has patient had a PCN reaction that required hospitalization no Has patient had a PCN reaction occurring within the last 10 years: no If all of the above answers are "NO", then may proceed with Cephalosporin  use.    Follow-up Information    Follow up with Foye Spurling, MD In 1 week.   Specialty:  Internal Medicine   Contact information:   [address] Kris Hartmann [place] [address] [phone]        The results of significant diagnostics from this hospitalization (including imaging, microbiology, ancillary and laboratory) are listed below for reference.    Significant Diagnostic Studies: Dg Chest 2 View  09/03/2015  CLINICAL DATA:  Central chest pain today with shortness of breath and right shoulder pain EXAM: CHEST  2 VIEW COMPARISON:  08/01/2011 FINDINGS: The heart size and vascular pattern are normal. There is aortic arch calcification. No consolidation or effusion. Bilateral shoulder arthritis. IMPRESSION: No active cardiopulmonary disease. Electronically Signed   By: Skipper Cliche M.D.   On: 09/03/2015 18:01   Nm Pulmonary Perf And Vent  09/04/2015  CLINICAL DATA:  Chest pain, elevated D-dimer EXAM: NUCLEAR MEDICINE VENTILATION - PERFUSION LUNG SCAN TECHNIQUE: Ventilation images were obtained in multiple projections using inhaled aerosol Tc-59m DTPA. Perfusion images were obtained in multiple projections after intravenous injection of Tc-15m MAA. RADIOPHARMACEUTICALS:  30 mCi Technetium-66m DTPA aerosol inhalation and 4 mCi Technetium-82m MAA IV COMPARISON:  Correlation with chest radiograph dated 09/03/2015 FINDINGS: Ventilation: Heterogeneous ventilation, with clumping of radiotracer in the central airways. No focal ventilation defect. Perfusion: Normal perfusion. No wedge shaped peripheral perfusion defects to suggest acute pulmonary embolism. IMPRESSION: Very low probability for pulmonary embolism. Electronically Signed   By: Julian Hy M.D.   On: 09/04/2015 15:27    Microbiology: No results found for this or any previous visit (from the past 240 hour(s)).   Labs: Basic Metabolic Panel:  Recent Labs Lab 09/03/15 1655 09/04/15 0527  NA 138 140  K 3.8 3.8   CL 101 105  CO2 25 25  GLUCOSE 113* 74  BUN 64* 59*  CREATININE 2.53* 2.50*  CALCIUM 10.6* 9.4   Liver Function Tests:  Recent Labs Lab 09/03/15 1655  AST 40  ALT 38  ALKPHOS 34*  BILITOT 0.8  PROT 7.5  ALBUMIN 3.5  No results for input(s): LIPASE, AMYLASE in the last 168 hours. No results for input(s): AMMONIA in the last 168 hours. CBC:  Recent Labs Lab 09/03/15 1655 09/04/15 0527  WBC 13.4* 12.2*  HGB 10.6* 9.5*  HCT 31.3* 28.0*  MCV 84.4 84.6  PLT 233 227   Cardiac Enzymes: No results for input(s): CKTOTAL, CKMB, CKMBINDEX, TROPONINI in the last 168 hours. BNP: BNP (last 3 results) No results for input(s): BNP in the last 8760 hours.  ProBNP (last 3 results) No results for input(s): PROBNP in the last 8760 hours.  CBG: No results for input(s): GLUCAP in the last 168 hours.     Signed:  Kelvin Cellar MD.  [facility] 09/04/2015, 6:25 PM

## 2015-09-04 NOTE — Progress Notes (Signed)
Patient and her son given discharge, follow up and medication instructions, verbalized understanding, IV and telemetry box removed, family to transport pt home.

## 2015-09-04 NOTE — Care Management Obs Status (Signed)
[place] NOTIFICATION   Patient Details  Name: Nicole Holland MRN: [id number] Date of Birth: 05-09-1936   Medicare Observation Status Notification Given:  Yes    MahabirJuliann Pulse, RN 09/04/2015, 12:55 PM

## 2015-09-04 NOTE — Care Management Note (Signed)
Case Management Note  Patient Details  Name: Nicole Holland MRN: [id number] Date of Birth: [birth year]  Subjective/Objective: 80 y/o f admitted w/chest pain. From home. PT-cons await recc.                   Action/Plan:d/c plan home.   Expected Discharge Date:                 Expected Discharge Plan:  [place]  In-House Referral:     Discharge planning Services  CM Consult  Post Acute Care Choice:    Choice offered to:     DME Arranged:    DME Agency:     HH Arranged:    [place] Agency:     Status of Service:  In process, will continue to follow  Medicare Important Message Given:    Date Medicare IM Given:    Medicare IM give by:    Date Additional Medicare IM Given:    Additional Medicare Important Message give by:     If discussed at [place] of Stay Meetings, dates discussed:    Additional Comments:  Dessa Phi, RN 09/04/2015, 12:55 PM

## 2015-09-13 DIAGNOSIS — M1990 Unspecified osteoarthritis, unspecified site: Secondary | ICD-10-CM | POA: Diagnosis not present

## 2015-09-13 DIAGNOSIS — J449 Chronic obstructive pulmonary disease, unspecified: Secondary | ICD-10-CM | POA: Diagnosis not present

## 2015-09-13 DIAGNOSIS — I10 Essential (primary) hypertension: Secondary | ICD-10-CM | POA: Diagnosis not present

## 2015-09-13 DIAGNOSIS — K589 Irritable bowel syndrome without diarrhea: Secondary | ICD-10-CM | POA: Diagnosis not present

## 2015-09-13 DIAGNOSIS — E780 Pure hypercholesterolemia: Secondary | ICD-10-CM | POA: Diagnosis not present

## 2015-12-07 ENCOUNTER — Ambulatory Visit (INDEPENDENT_AMBULATORY_CARE_PROVIDER_SITE_OTHER): Payer: Medicare Other | Admitting: Allergy and Immunology

## 2015-12-07 ENCOUNTER — Encounter: Payer: Self-pay | Admitting: Allergy and Immunology

## 2015-12-07 VITALS — BP 160/90 | HR 76 | Temp 97.7°F | Resp 16 | Ht <= 58 in | Wt 178.0 lb

## 2015-12-07 DIAGNOSIS — R059 Cough, unspecified: Secondary | ICD-10-CM

## 2015-12-07 DIAGNOSIS — R05 Cough: Secondary | ICD-10-CM

## 2015-12-07 DIAGNOSIS — J309 Allergic rhinitis, unspecified: Secondary | ICD-10-CM | POA: Diagnosis not present

## 2015-12-07 DIAGNOSIS — R220 Localized swelling, mass and lump, head: Secondary | ICD-10-CM

## 2015-12-07 DIAGNOSIS — H1010 Acute atopic conjunctivitis, unspecified eye: Secondary | ICD-10-CM | POA: Diagnosis not present

## 2015-12-07 DIAGNOSIS — K130 Diseases of lips: Secondary | ICD-10-CM | POA: Diagnosis not present

## 2015-12-07 MED ORDER — EPINEPHRINE 0.3 MG/0.3ML IJ SOAJ: 0.3000 mg | Freq: Once | INTRAMUSCULAR | Status: DC

## 2015-12-07 NOTE — Patient Instructions (Addendum)
Take Home Sheet  1. Avoidance: Mite, Mold and Pollen And peanut/blueberryshellfish for now.   2. Antihistamine: Zyrtec 10mg  by mouth once daily for runny nose or itching.   3. Nasal Spray: Saline  2 spray(s) each nostril twice daily for stuffy nose or drainage.    4. Inhalers:  Rescue: ProAir 2 puffs every 4 hours as needed for cough or wheeze.       -May use 2 puffs 10-20 minutes prior to exercise.   Preventative: Symbicort 162mcg 2 puffs twice daily (Rinse, gargle, and spit out after use).  5.  Other: If new episodes of swelling --write down exposure, ingestion, activity and environment and take picture.   Consider selected labs at [facility].  6. Epi-pen/benadryl as needed.   7. Follow up Visit: 2 months or sooner if needed.   Websites that have reliable Patient information: 1. American Academy of Asthma, Allergy, & Immunology: www.aaaai.org 2. Food Allergy Network: www.foodallergy.org 3. Mothers of Asthmatics: www.aanma.org 4. [place]: DiningCalendar.de 5. American College of Allergy, Asthma, & Immunology: https://robertson.info/ or www.acaai.org  Control of House Dust Mite Allergen  House dust mites play a major role in allergic asthma and rhinitis.  They occur in environments with high humidity wherever human skin, the food for dust mites is found. High levels have been detected in dust obtained from mattresses, pillows, carpets, upholstered furniture, bed covers, clothes and soft toys.  The principal allergen of the house dust mite is found in its feces.  A gram of dust may contain 1,000 mites and 250,000 fecal particles.  Mite antigen is easily measured in the air during house cleaning activities.  1. Encase mattresses, including the box spring, and pillow, in an air tight cover.  Seal the zipper end of the encased mattresses with wide adhesive tape. 2. Wash the bedding in water of 130 degrees Farenheit weekly.  Avoid cotton comforters/quilts  and flannel bedding: the most ideal bed covering is the dacron comforter. 3. Remove all upholstered furniture from the bedroom. 4. Remove carpets, carpet padding, rugs, and non-washable window drapes from the bedroom.  Wash drapes weekly or use plastic window coverings. 5. Remove all non-washable stuffed toys from the bedroom.  Wash stuffed toys weekly. 6. Have the room cleaned frequently with a vacuum cleaner and a damp dust-mop.  The patient should not be in a room which is being cleaned and should wait 1 hour after cleaning before going into the room. 7. Close and seal all heating outlets in the bedroom.  Otherwise, the room will become filled with dust-laden air.  An electric heater can be used to heat the room. Reduce indoor humidity to less than 50%.  Do not use a humidifier.  Reducing Pollen Exposure  The American Academy of Allergy, Asthma and Immunology suggests the following steps to reduce your exposure to pollen during allergy seasons.  8. Do not hang sheets or clothing out to dry; pollen may collect on these items. 9. Do not mow lawns or spend time around freshly cut grass; mowing stirs up pollen. 10. Keep windows closed at night.  Keep car windows closed while driving. 11. Minimize morning activities outdoors, a time when pollen counts are usually at their highest. 12. Stay indoors as much as possible when pollen counts or humidity is high and on windy days when pollen tends to remain in the air longer. 13. Use air conditioning when possible.  Many air conditioners have filters that trap the pollen spores. 14. Use  a HEPA room air filter to remove pollen form the indoor air you breathe.  Control of Mold Allergen  Mold and fungi can grow on a variety of surfaces provided certain temperature and moisture conditions exist.  Outdoor molds grow on plants, decaying vegetation and soil.  The major outdoor mold, Alternaria dn Cladosporium, are found in very high numbers during hot and dry  conditions.  Generally, a late Summer - Fall peak is seen for common outdoor fungal spores.  Rain will temporarily lower outdoor mold spore count, but counts rise rapidly when the rainy period ends.  The most important indoor molds are Aspergillus and Penicillium.  Dark, humid and poorly ventilated basements are ideal sites for mold growth.  The next most common sites of mold growth are the bathroom and the kitchen.  Outdoor [employer] 1. Use air conditioning and keep windows closed 2. Avoid exposure to decaying vegetation. 3. Avoid leaf raking. 4. Avoid grain handling. 5. Consider wearing a face mask if working in moldy areas.  Indoor Mold Control 1. Maintain humidity below 50%. 2. Clean washable surfaces with 5% bleach solution. 3. Remove sources e.g. Contaminated carpets.  Control of Cockroach Allergen  Cockroach allergen has been identified as an important cause of acute attacks of asthma, especially in urban settings.  There are fifty-five species of cockroach that exist in the [place], however only three, the [place], [place] species produce allergen that can affect patients with Asthma.  Allergens can be obtained from fecal particles, egg casings and secretions from cockroaches.  1. Remove food sources. 2. Reduce access to water. 3. Seal access and entry points. 4. Spray runways with 0.5-1% Diazinon or Chlorpyrifos 5. Blow boric acid power under stoves and refrigerator. 6. Place bait stations (hydramethylnon) at feeding sites.

## 2015-12-07 NOTE — Progress Notes (Addendum)
NEW PATIENT NOTE  RE: RONNELL UMPHENOUR MRN: [id number] DOB: 13-Apr-1936 [facility] [place] [address] [place] [id number] Date of Office Visit: 12/07/2015  Dear Helane Rima, MD:  I had the pleasure of seeing Joselle today in initial evaluation, as you recall-- Subjective:  BULEAH EFFRON is a 80 y.o. female who presents today for Asthma; Wheezing; Sinusitis; and Allergic Reaction  Assessment:   1. Allergic rhinoconjunctivitis.   2. Previous diagnosis of asthma with intermittent cough.   3. Lip swelling--Possible food allergy, with reactive peanut, shellfish mix skin test today--unclear clinical significance.   4.      Patient report of generalized pruritus with penicillin administration. 5.      Complex medical history, including hypertension on antihypertensive and breast cancer survivor.      Plan:   Meds ordered this encounter  Medications  . EPINEPHrine 0.3 mg/0.3 mL IJ SOAJ injection    Sig: Inject 0.3 mLs (0.3 mg total) into the muscle once.    Dispense:  2 Device    Refill:  2  1. Avoidance: Mite, Mold and Pollen And peanut/blueberryshellfish for now. 2. Antihistamine: Zyrtec 10mg  by mouth once daily for runny nose or itching. 3. Nasal Spray: Continue Nasonex 1-2 sprays each nostril once daily and Saline  2 spray(s) each nostril twice daily for stuffy nose or drainage.  4. Inhalers:  Rescue: ProAir 2 puffs every 4 hours as needed for cough or wheeze.       -May use 2 puffs 10-20 minutes prior to exercise.  Preventative: Symbicort 164mcg 2 puffs twice daily (Rinse, gargle, and spit out after use). 5.  Other: If new episodes of swelling --write down exposure, ingestion, activity and environment and take picture.  Consider selected labs at [facility], food specific IgE. 6. Epi-pen/benadryl as needed. 7. Follow-up blood pressure check and review with primary MD. 8. Follow up Visit: 2 months or sooner if needed.  HPI: Jasmynn presents to the  office in initial evaluation of lip swelling.  She reports a history of asthma and recurring upper respiratory symptoms for more than 25 years, describing year-round rhinorrhea, congestion, sneezing, itchy watery eyes, postnasal drip and cough.  She has had wheezing and chest congestion, having completed a short time on immunotherapy while, living in [place], but denies any recent significant chest symptoms.  She only uses her maintenance Symbicort less than once a day and describes albuterol use about once a month.  She exercises regularly and denies any associated exercise induced acute respiratory symptoms.  No recent systemic steroids ED visits or hospitalizations.  She feels her nasal symptoms are triggered by outdoor, fluctuant weather patterns and pollen exposures. In addition, in the recent months she has been concerned about lip swelling (without facial or eye swelling) episodes about twice in the month of June.  There is no associated throat/tongue swelling, dysphagia, difficulty breathing, shortness of breath, cough or congestion.  She describes mild itching but no generalized itching, hives or other skin changes.  She uses [employer], [employer] lotion, All free and clear detergent and sensitive toothpaste.  She has difficulty describing the environment where her episodes occurred, but believes once was after exercising at the gym and possibly once at church.  She has treated with Benadryl, which seems beneficial, and possibly prior to eating anything for breakfast.  She eats a variety of foods --no pork, but in particular has had Boost or Ensure protein shake, blueberry waffle's, pomegranate/cranberry  juice, boiled egg or sausage for breakfast.  Does not recall any tick bites.  Denies ED or Urgent care visits, prednisone or antibiotic courses.  Medical History: Past Medical History  Diagnosis Date  . Hypertension   . Hyperlipidemia   . Arthritis   . Asthma   . 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 S/P radiation therapy 10/28/11 - 12/02/11    Right Breast/ 5000cGy/ 25 Fractions  . Vitamin D deficiency   . Use of tamoxifen (Nolvadex) 12/04/11    Dr. Truddie Coco  . Gout    Surgical History: Past Surgical History  Procedure Laterality Date  . Breast surgery  02/01/2000    right breast subareolar core biopsy  . Cesarean section    . Tonsillectomy    . Breast lumpectomy  08/02/11    Lumpectomy right Breast; Low Grade Ductal Carcinoma In Situ with Associated Calcifications, ER 65%, PR 99%  . Breast surgery  09/27/11    Right Breast; Excision of  Anterior Margin- Benign Breast Parenchyma, Negative for In situ and Invasive Cancer, Surgical Margins Negative, Benign skin, Negative for Malignancy  . Abdominal hysterectomy  1994    partial ovaries intact   Family History: Family History  Problem Relation Age of Onset  . Stroke Mother   . Diabetes Mother   . Cancer Father     Lung cancer  . Diabetes Sister   . Cancer Sister     Unknown Cancer  . Diabetes Brother   . Cancer Brother     Unknown cancer  . Diabetes Brother   . Cancer Brother     Unknown cancer  . Cancer Brother     Unknown Cancer  . Diabetes Brother   . Cancer Brother     Unknown Cancer   Social History: Social History  . Marital Status: Married    Spouse Name: N/A  . Number of Children: 4  . Years of Education: N/A   Social History Main Topics  . Smoking status: Never Smoker   . Smokeless tobacco: Never Used  . Alcohol Use: No  . Drug Use: No  . Sexual Activity: No     Comment: mensus age 65,G5P4, 38st preg age 4, b.c short time,couldn't tolerate   Social History Narrative  Renn's husband lives in [place].  Linzy has a current medication list which includes the following prescription(s): acetaminophen, albuterol, aspirin ec, atorvastatin, budesonide-formoterol, calcium carbonate, carvedilol, furosemide, guaifenesin, latanoprost, mometasone,  multiple vitamins-minerals, olopatadine hcl, fish oil, polyethylene glycol 400, uloric, vitamin c.   Drug Allergies: Allergies  Allergen Reactions  . Iodides Itching    All over the body.  . Penicillins Itching (Without rash.)     Environmental History: Aubri lives in a 78 year old house for 11 years with carpet floors, with central heat and air; stuffed mattress, non-feather pillow/comforter without humidifier, pets and smokers.   Review of Systems  Constitutional: Negative for fever, weight loss and malaise/fatigue.  HENT: Positive for congestion. Negative for ear pain, hearing loss, nosebleeds and sore throat.   Eyes: Negative for discharge and redness.  Respiratory: Negative for shortness of breath.        Denies history of bronchitis and pneumonia.  Cardiovascular: Negative for chest pain and leg swelling.  Gastrointestinal: Negative for heartburn, nausea, vomiting, abdominal pain, diarrhea and constipation.  Genitourinary: Negative.   Musculoskeletal: Positive for joint pain (History of arthritis.). Negative for myalgias.  Skin: Positive for itching. Negative for rash.  Neurological: Negative.  Negative for dizziness, seizures, weakness and headaches.  Endo/Heme/Allergies: Positive for environmental allergies.       Denies sensitivity to aspirin, NSAIDs, stinging insects, latex, jewelry and cosmetics.  Immunological: No chronic or recurring infections. Objective:   Filed Vitals:   12/07/15 1350  BP: 160/90  Pulse: 76  Temp: 97.7 F (36.5 C)  Resp: 16   SpO2 Readings from Last 1 Encounters:  12/07/15 97%   Physical Exam  Constitutional: She is well-developed, well-nourished, and in no distress.  HENT:  Head: Atraumatic.  Right Ear: Tympanic membrane and ear canal normal.  Left Ear: Tympanic membrane and ear canal normal.  Nose: Mucosal edema present. No rhinorrhea. No epistaxis.  Mouth/Throat: Oropharynx is clear and moist and mucous membranes are normal. No  oropharyngeal exudate, posterior oropharyngeal edema or posterior oropharyngeal erythema.  Eyes: Conjunctivae are normal.  Neck: Neck supple.  Cardiovascular: Normal rate, S1 normal and S2 normal.   No murmur heard. Pulmonary/Chest: Effort normal. She has no wheezes. She has no rhonchi. She has no rales.  Abdominal: Soft. Normal appearance and bowel sounds are normal.  Musculoskeletal: She exhibits no edema.  Lymphadenopathy:    She has no cervical adenopathy.  Neurological: She is alert.  Skin: Skin is warm and intact. No rash noted. No cyanosis. Nails show no clubbing.  No dermatographia.    Diagnostics: Spirometry:  FVC 1.31--76%, FEV1 0.97--72%.  (See scanned image).  ACT=24    Delora Gravatt M. Ishmael Holter, MD   cc: Helane Rima, MD

## 2015-12-08 ENCOUNTER — Other Ambulatory Visit: Payer: Self-pay

## 2015-12-08 DIAGNOSIS — C50419 Malignant neoplasm of upper-outer quadrant of unspecified female breast: Secondary | ICD-10-CM

## 2015-12-08 DIAGNOSIS — Z853 Personal history of malignant neoplasm of breast: Secondary | ICD-10-CM

## 2015-12-08 MED ORDER — CETIRIZINE HCL 10 MG PO TABS: 10.0000 mg | ORAL_TABLET | Freq: Every day | ORAL | Status: DC

## 2015-12-13 ENCOUNTER — Telehealth: Payer: Self-pay

## 2015-12-13 NOTE — Telephone Encounter (Signed)
Patient would like a nurse to call her and go over her visit that she had with Dr. Ishmael Holter she is not certain what she is allergic to.  Please Advise  Thanks

## 2015-12-13 NOTE — Telephone Encounter (Signed)
Spoke with pt and informed her of what her test results stated.

## 2015-12-15 NOTE — Telephone Encounter (Signed)
Pt would like to talk to a nurse.  Please Advise

## 2015-12-20 NOTE — Telephone Encounter (Signed)
SPOKE WITH PT AGAIN, SHE IS STILL VERY CONFUSED I HAVE EXPLAINED SEVERAL TIMES WHAT SHE IS ALLERGIC TOO AND I BELIEVE SHE IS JUST NOT UNDER STANDING IT.

## 2016-01-24 ENCOUNTER — Ambulatory Visit (INDEPENDENT_AMBULATORY_CARE_PROVIDER_SITE_OTHER): Payer: Medicare Other | Admitting: Allergy & Immunology

## 2016-01-24 ENCOUNTER — Encounter: Payer: Self-pay | Admitting: Allergy & Immunology

## 2016-01-24 VITALS — BP 145/80 | HR 80 | Resp 16

## 2016-01-24 DIAGNOSIS — T781XXD Other adverse food reactions, not elsewhere classified, subsequent encounter: Secondary | ICD-10-CM | POA: Diagnosis not present

## 2016-01-24 DIAGNOSIS — H1010 Acute atopic conjunctivitis, unspecified eye: Secondary | ICD-10-CM | POA: Diagnosis not present

## 2016-01-24 DIAGNOSIS — R05 Cough: Secondary | ICD-10-CM

## 2016-01-24 DIAGNOSIS — R220 Localized swelling, mass and lump, head: Secondary | ICD-10-CM

## 2016-01-24 DIAGNOSIS — J309 Allergic rhinitis, unspecified: Secondary | ICD-10-CM

## 2016-01-24 DIAGNOSIS — K130 Diseases of lips: Secondary | ICD-10-CM

## 2016-01-24 DIAGNOSIS — J4540 Moderate persistent asthma, uncomplicated: Secondary | ICD-10-CM | POA: Insufficient documentation

## 2016-01-24 DIAGNOSIS — T781XXA Other adverse food reactions, not elsewhere classified, initial encounter: Secondary | ICD-10-CM | POA: Insufficient documentation

## 2016-01-24 DIAGNOSIS — R059 Cough, unspecified: Secondary | ICD-10-CM

## 2016-01-24 NOTE — Patient Instructions (Signed)
1. Allergic rhinoconjunctivitis - Continue Nasonex 1-2 sprays per nostril daily. - Continue cetirizine 10mg  daily. - You can take an extra cetirizine if you are having worsening symptoms  2. Moderate persistent asthma - Continue Symbicort 160/4.5 two puffs twice daily with spacer (every day inhaler) - Use albuterol 4 puffs every 4-6 hours for coughing/wheezing (rescue inhaler) - Breathing test looked great today.   3. Lip swelling - The lip swelling seems to be associated with your food allergies. - I do not think we need to do further workup into the lip swelling.  - Continue to take notes during future episodes of lip swelling.   4. Adverse food reaction, subsequent encounter - Foods you should avoid: peanut (}peanut oil" should be fine since there is no protein in it), soy ("soy lecithin" should be safe to eat), shellfish - Stop drinking Boost and Ensure since they have soy protein in them.  - Don't eat blueberries for now, but we will get blood work to see if you have allergies to the blueberry. - We will call you with the results.  - Take your EpiPen with you at all times. - We will talk to your PCP about getting you to see an registered dietician to discuss replacements for the Ensure and the Boost.  5. Return to clinic in three months.  It was a pleasure to meet you today!

## 2016-01-24 NOTE — Progress Notes (Signed)
FOLLOW UP  Date of Service/Encounter:  01/24/16   Assessment:   Cough - Plan: Spirometry with Graph  Allergic rhinoconjunctivitis  Lip swelling  Adverse food reaction, subsequent encounter - Plan: Allergen, Blueberry, [id number]   Asthma Reportables:  Severity: : moderate persistent  Risk: low Control: well controlled  Seasonal Influenza Vaccine: yes   The CDC recommends patients with persistent asthma between the ages of 19-64 years receive PPSV-23 vaccination, if this patient qualifies, has he/she received 1 dose of PPSV-23 yet? Yes    Plan/Recommendations:    1. Allergic rhinoconjunctivitis - Continue Nasonex 1-2 sprays per nostril daily. - Continue cetirizine 10mg  daily. Nicole Holland can take an extra cetirizine if she is having worsening symptoms.  2. Moderate persistent asthma - Continue Symbicort 160/4.5 two puffs twice daily with spacer (every day inhaler) - Use albuterol 4 puffs every 4-6 hours for coughing/wheezing (rescue inhaler) - Spirometry looked great today.   3. Lip swelling - The lip swelling seems to be associated with your food allergies. - I do not think we need to do further workup into the lip swelling.  - Continue to take notes during future episodes of lip swelling.   4. Adverse food reaction, subsequent encounter - Foods you should avoid: peanut ("peanut oil" should be fine since there is no protein in it), soy ("soy lecithin" should be safe to eat), shellfish - Stop drinking Boost and Ensure since they have soy protein in them.  - I recommended that she not eat blueberries for now. - If the bloodwork for blueberry sIgE is negative, she can introduce blueberries again at home.  - Since she was drinking Boost at the same time as eating the blueberries, the soy in the Boost was the most likely culprit.  - Encouraged epinephrine carriage.  - She would benefit from seeing a registered dietician to discuss replacements for the Ensure and the Boost.  This can be coordinated through the PCP.   5. Return to clinic in three months.    Subjective:   Nicole Holland is a 80 y.o. female presenting today for follow up of  Chief Complaint  Patient presents with  . Cough  . Other    lip swelling  .  Nicole Holland has a history of the following: Patient Active Problem List   Diagnosis Date Noted  . Chest pain 09/03/2015  . CKD (chronic kidney disease), stage IV ([place]) 09/03/2015  . Normocytic anemia 09/03/2015  . Leukocytosis 09/03/2015  . Malignant neoplasm of upper-outer quadrant of right female breast ([place]) 10/11/2011  . Hypertension 10/25/2010  . Hyperlipidemia 10/25/2010    History obtained from: chart review and patient.  Nicole Holland was referred by Nicole Rima, MD.     Nicole Holland is a 80 y.o. female presenting for a follow up visit for asthma, rhinitis, and lip swelling. Nicole Holland is a 80 year old female with a history of allergic rhinoconjunctivitis, moderate persistent asthma, and lip swelling who presents for a follow-up visit. She was last seen in June 2017 by Dr. Ishmael Holland, who has since left the practice. At that time, she seemed to be doing well. She was continued on Nasonex one to 2 sprays in each nostril daily as well as saline in addition to cetirizine 10 mg daily. For her coughing, she was started on Symbicort 160/4.5 2 puffs twice daily. She was also instructed to use Pro Air 2 puffs every 4 hours as needed. Her spirometry at the last visit was essentially normal for  age. There is discussion about doing lab work, but Dr. Ishmael Holland wanted her to keep a good turn all of her symptoms before we order this. She did have skin testing performed at the last visit that was positive to grasses, ragweed (ID), weeds (ID), trees, one mold, dust mites, dog, and roach. She also had food testing performed that demonstrated positives to peanut, soy, and shellfish. She was also labeled as positives to blueberries.   Since that visit, she has  avoided all of the foods. She did have some lip swelling on July 23rd but she "caught it before it swelled up real good". She did have a Boost on the day which does contain soy protein. She has not had any since that time. She also stopped drinking Ensure as well, which review of the web site shows has soy protein in it. She has multiple bottles of these at home now but she is unable to have them. She will see her PCP on August 16th for a "wellness test". Overall she has had a tough time finding replacements for all of her foods with her new food allergies. She does carry her EpiPen with her at all times.   Asthma/Respiratory Symptom History: Rescue medication: Therapist, Nicole Holland (uses a couple puffs prior to physical activity, otherwise no problems) Controller(s): Symbicort 160/4.5 two puffs BID with spacer Triggers:  pollens, smoke and strong odors  Average frequency of daytime symptoms: rare Average frequency of nocturnal symptoms: rare  Average frequency of exercise symptoms: rare Hospitalizations for respiratory symptoms since last visit (self report): 0 ACT or TRACK score: 25 ED or Urgent Care visits for respiratory symptoms since last visit (self report): 0 Oral/systemic corticosteroids for respiratory symptoms since last visit: 0  Allergic Rhinitis Symptom History:  Symptoms: nasal congestion, rhinorrhea, sneezing, eye itching, watery eyes, cough and postnasal drip Times of year: througohut the year Exacerbating environments: cold air, pollens and weather changes  Treatments tried: nasal saline, intranasal steroids: Nasonex 1-2 sprays per nostril daily, oral antihistamines: cetirizine 10mg  daily Allergy tested in the past: Yes (years ago in [place]) On IT in the past: Yes (years ago in [place])  Food Allergy Symptom History: Nicole Holland has had less swelling on 2 occasions in the month of June. There is no associated throat or tongue swelling, dysphagia, difficulty breathing, or shortness  of breath. She has continued to avoid all of her trigger foods. She does not eat peanuts due to indigestion symptoms that occur with them. She eats tree nuts especially cashews. She will eat walnuts. Shaneqwa is unsure whether blueberries are definitely involved. At the time, she would have Boost along with blueberries and bananas. Prior to this, she was tolerating blueberries ad lib. She never had swelling with blueberries alone - she always had Boost when she had the lip swelling.   Roslynn does have a history of hypertension and is currently on carvedilol and lasix. Otherwise, there have been no changes to the past medical history, surgical history, family history, or social history.     Review of Systems: a 14-point review of systems is pertinent for what is mentioned in HPI.  Otherwise, all other systems were negative. Constitutional: negative other than that listed in the HPI Eyes: negative other than that listed in the HPI Ears, nose, mouth, throat, and face: negative other than that listed in the HPI Respiratory: negative other than that listed in the HPI Cardiovascular: negative other than that listed in the HPI Gastrointestinal: negative other than that  listed in the HPI Genitourinary: negative other than that listed in the HPI Integument: negative other than that listed in the HPI Hematologic: negative other than that listed in the HPI Musculoskeletal: negative other than that listed in the HPI Neurological: negative other than that listed in the HPI Allergy/Immunologic: negative other than that listed in the HPI    Objective:   Blood pressure (!) 145/80, pulse 80, resp. rate 16. There is no height or weight on file to calculate BMI.   Physical Exam:  General: Alert, interactive, in no acute distress. Sweet elderly female. Very cooperative with the exam.  HEENT: TMs pearly gray, turbinates edematous without discharge, post-pharynx mildly erythematous. Neck: Supple without  lymphadenopathy. Lungs: Clear to auscultation without wheezing, rhonchi or rales. No increased work of breathing. No crackles noted CV: Normal S1, S2 without murmurs. Capillary refill <2 seconds.  Abdomen: Nondistended, nontender. No masses.  Skin:Warm and dry, without lesions or rashes. Extremities: No clubbing, cyanosis or edema. Neuro: Grossly intact. No focal deficits noted   Diagnostic studies:  Spirometry: results normal (FEV1: 1.17/88% 1.48/86%, FVC: 79%, FEV1/FVC: 79%).    Spirometry consistent with normal pattern   Allergy Studies: None     Salvatore Marvel, MD [facility] [place]

## 2016-01-25 LAB — ALLERGEN, BLUEBERRY, RF288: Allergen, Blueberry, Rf288: 0.14 kU/L — ABNORMAL HIGH

## 2016-01-31 ENCOUNTER — Other Ambulatory Visit: Payer: Self-pay | Admitting: Nurse Practitioner

## 2016-05-01 ENCOUNTER — Encounter: Payer: Self-pay | Admitting: Allergy & Immunology

## 2016-05-01 ENCOUNTER — Ambulatory Visit (INDEPENDENT_AMBULATORY_CARE_PROVIDER_SITE_OTHER): Payer: Medicare Other | Admitting: Allergy & Immunology

## 2016-05-01 VITALS — BP 126/70 | HR 70 | Temp 98.1°F | Resp 16

## 2016-05-01 DIAGNOSIS — J3089 Other allergic rhinitis: Secondary | ICD-10-CM

## 2016-05-01 DIAGNOSIS — J4540 Moderate persistent asthma, uncomplicated: Secondary | ICD-10-CM | POA: Diagnosis not present

## 2016-05-01 DIAGNOSIS — R220 Localized swelling, mass and lump, head: Secondary | ICD-10-CM

## 2016-05-01 DIAGNOSIS — T781XXD Other adverse food reactions, not elsewhere classified, subsequent encounter: Secondary | ICD-10-CM | POA: Diagnosis not present

## 2016-05-01 NOTE — Patient Instructions (Addendum)
1. Allergic rhinoconjunctivitis - Continue Nasonex 1-2 sprays per nostril daily. - Continue cetirizine 10mg  daily. - You can take an extra cetirizine if you are having worsening symptoms  2. Moderate persistent asthma - Daily controller medication(s): Symbicort 160/4.5 two puffs twice daily - Rescue medications: ProAir 4 puffs every 4-6 hours as needed - Asthma control goals:  * Full participation in all desired activities (may need albuterol before activity) * Albuterol use two time or less a week on average (not counting use with activity) * Cough interfering with sleep two time or less a month * Oral steroids no more than once a year * No hospitalizations - Continue Symbicort 160/4.5 two puffs twice daily with spacer (every day inhaler) - Use albuterol 4 puffs every 4-6 hours for coughing/wheezing (rescue inhaler) - Breathing test looked great today.   3. Lip swelling - I am not sure why you develop this lip swelling. - Continue to use antihistamines to help control this.   4. Adverse food reactions (peanuts, soy, shellfish) - Foods you should avoid: peanut ("peanut oil" should be fine since there is no protein in it), soy ("soy lecithin" should be safe to eat), shellfish - Stop drinking Boost and Ensure since they have soy protein in them.  - As long as you choose the Ensure without the soy listed as an ingredient, you should be fine.  - It is ok to drink your teas.  5. Return in about 6 months (around 10/29/2016).  Please inform us of any Emergency Department visits, hospitalizations, or changes in symptoms. Call us before going to the ED for breathing or allergy symptoms since we might be able to fit you in for a sick visit. Feel free to contact us anytime with any questions, problems, or concerns.  It was a pleasure to see you again today!   Websites that have reliable patient information: 1. American Academy of Asthma, Allergy, and Immunology: www.aaaai.org 2. Food Allergy  Research and Education (FARE): foodallergy.org 3. Mothers of Asthmatics: http://www.asthmacommunitynetwork.org 4. American College of Allergy, Asthma, and Immunology: www.acaai.org

## 2016-05-01 NOTE — Progress Notes (Signed)
`1  FOLLOW UP  Date of Service/Encounter:  05/01/16   Assessment:   Moderate persistent asthma, uncomplicated  Chronic nonseasonal allergic rhinitis due to pollen  Lip swelling  Multiple food allergies   Asthma Reportables:  Severity: moderate persistent  Risk: low Control: well controlled  Seasonal Influenza Vaccine: yes    Plan/Recommendations:   1. Allergic rhinoconjunctivitis - Continue Nasonex 1-2 sprays per nostril daily. - Continue cetirizine 10mg  daily. - You can take an extra cetirizine if you are having worsening symptoms  2. Moderate persistent asthma - Daily controller medication(s): Symbicort 160/4.5 two puffs twice daily - Rescue medications: ProAir 4 puffs every 4-6 hours as needed - Asthma control goals:  * Full participation in all desired activities (may need albuterol before activity) * Albuterol use two time or less a week on average (not counting use with activity) * Cough interfering with sleep two time or less a month * Oral steroids no more than once a year * No hospitalizations - Continue Symbicort 160/4.5 two puffs twice daily with spacer (every day inhaler) - Use albuterol 4 puffs every 4-6 hours for coughing/wheezing (rescue inhaler) - Breathing test looked great today.   3. Lip swelling - I am not sure why you develop this lip swelling. - Continue to use antihistamines to help control this.   4. Adverse food reactions (peanuts, soy, shellfish) - I am concerned that she is altering her life significantly due to her anxiety about her food allergies. - I explained the processing of soy beans and reassured her that soy oil or soy lecithin is perfectly safe to consume. - It is reassuring that she is able to read labels so well, however, I think she is taking it a bit too far. - Foods you should avoid: peanut ("peanut oil" should be fine since there is no protein in it), soy ("soy lecithin" should be safe to eat), shellfish - Stop drinking  Boost and Ensure since they have soy protein in them.  - As long as you choose the Ensure without the soy listed as an ingredient, you should be fine.  - It is ok to drink your teas.  5. Return in about 6 months (around 10/29/2016).     Subjective:   Nicole Holland is a 80 y.o. female presenting today for follow up of  Chief Complaint  Patient presents with  . Angioedema    Sunday morning the patient lip was swelling- the patient took a benadryl   . Follow-up  .  Nicole Holland has a history of the following: Patient Active Problem List   Diagnosis Date Noted  . Moderate persistent asthma 01/24/2016  . Adverse food reaction 01/24/2016  . Allergic rhinoconjunctivitis 01/24/2016  . Chest pain 09/03/2015  . CKD (chronic kidney disease), stage IV ([place]) 09/03/2015  . Normocytic anemia 09/03/2015  . Leukocytosis 09/03/2015  . Malignant neoplasm of upper-outer quadrant of right female breast ([place]) 10/11/2011  . Hypertension 10/25/2010  . Hyperlipidemia 10/25/2010    History obtained from: chart review and patient.  Nicole Holland was referred by Helane Rima, MD.     Nicole Holland is a 80 y.o. female presenting for a follow up visit. I last saw Nicole Holland in August 2017. At that time, I continued her on Nasonex as well as cetirizine. For her asthma, she was continued on Symbicort 162 puffs in the morning and 2 puffs at night as well as albuterol when necessary. Her spirometry was normal. At that time, she  was also complaining of food allergies including lip swelling with boost and ensure. She has had food allergy testing in the past that was positive to peanut, soy, and shellfish. She was complaining of reactions to blueberries at the last visit, but blood testing was 0.14, therefore I encouraged her to introduce these back into her diet at home.  Since last visit, she has done well. Nicole Holland's asthma has been well controlled. She has not required rescue medication, experienced  nocturnal awakenings due to lower respiratory symptoms, nor have activities of daily living been limited. She remains on Symbicort 160/4.5 two puffs twice daily. She does use a spacer.   Today, Nicole Holland is concerned with her teas. She used to drink teas all the time, however Dr. Ishmael Holter and told her at her first visit to avoid all teas. She brings a long list of teas today for me to go through. She has had no teas since his first visit with Dr. Ishmael Holter. She otherwise continues to avoid soy, peanuts, and shellfish. She found an Ensure that contain no soy, therefore she is planning to start drinking this. She is somewhat fanatical about reading labels and is avoiding everything that contain any type of soy, including soy lecithin. She has even changed her social life due to her food allergies and is planning to stay at home for the holidays because she does not want inconvenience anyone who might be cooking for her. Since last visit, she did reintroduce blueberries and her diet. She is his blueberries and her daily oatmeal.   Otherwise, there have been no changes to her past medical history, surgical history, family history, or social history. She lives alone in her home. She does have one child here in [place] and another child in [place]. She has grandchildren and great-grandchildren.     Review of Systems: a 14-point review of systems is pertinent for what is mentioned in HPI.  Otherwise, all other systems were negative. Constitutional: negative other than that listed in the HPI Eyes: negative other than that listed in the HPI Ears, nose, mouth, throat, and face: negative other than that listed in the HPI Respiratory: negative other than that listed in the HPI Cardiovascular: negative other than that listed in the HPI Gastrointestinal: negative other than that listed in the HPI Genitourinary: negative other than that listed in the HPI Integument: negative other than that listed in the  HPI Hematologic: negative other than that listed in the HPI Musculoskeletal: negative other than that listed in the HPI Neurological: negative other than that listed in the HPI Allergy/Immunologic: negative other than that listed in the HPI    Objective:   Blood pressure 126/70, pulse 70, temperature 98.1 F (36.7 C), temperature source Oral, resp. rate 16, SpO2 96 %. There is no height or weight on file to calculate BMI.   Physical Exam:  General: Alert, interactive, in no acute distress. Very pleasant and talkative. Very interactive. HEENT: TMs pearly gray, turbinates edematous with clear discharge, post-pharynx erythematous. Neck: Supple without thyromegaly. Lungs: Clear to auscultation without wheezing, rhonchi or rales. No increased work of breathing. CV: Normal S1/S2, faint 99991111 systolic ejection murmur heard best at the aortic region. Capillary refill <2 seconds.  Abdomen: Nondistended, nontender. No guarding or rebound tenderness. Bowel sounds faint  Skin: Warm and dry, without lesions or rashes. Extremities:  No clubbing, cyanosis or edema. Neuro:   Grossly intact.  Diagnostic studies:  Spirometry: results normal (FEV1: 1.09/72%, FVC: 1.65/88%, FEV1/FVC: 66%).  Spirometry consistent with possible restrictive disease. Compared to her values obtained at the last visit, there is been no significant change.  Allergy Studies: None     Salvatore Marvel, MD [facility]

## 2016-08-14 ENCOUNTER — Telehealth: Payer: Self-pay | Admitting: Allergy and Immunology

## 2016-08-14 NOTE — Telephone Encounter (Signed)
Explained bill to pt - what's owing is 05-01-16 visit - she paid the 01-24-16 visit

## 2016-08-14 NOTE — Telephone Encounter (Signed)
Please call patient back regarding her remaining balance. Pt stated that she already made this payment and it shows on her bank statement.

## 2016-10-30 ENCOUNTER — Encounter: Payer: Self-pay | Admitting: Allergy & Immunology

## 2016-10-30 ENCOUNTER — Ambulatory Visit (INDEPENDENT_AMBULATORY_CARE_PROVIDER_SITE_OTHER): Payer: Medicare Other | Admitting: Allergy & Immunology

## 2016-10-30 ENCOUNTER — Ambulatory Visit: Payer: Medicare Other | Admitting: Allergy & Immunology

## 2016-10-30 VITALS — BP 132/60 | HR 68 | Temp 98.4°F | Resp 14 | Ht 59.0 in | Wt 192.8 lb

## 2016-10-30 DIAGNOSIS — T781XXD Other adverse food reactions, not elsewhere classified, subsequent encounter: Secondary | ICD-10-CM

## 2016-10-30 DIAGNOSIS — R220 Localized swelling, mass and lump, head: Secondary | ICD-10-CM

## 2016-10-30 DIAGNOSIS — J4540 Moderate persistent asthma, uncomplicated: Secondary | ICD-10-CM

## 2016-10-30 DIAGNOSIS — J3089 Other allergic rhinitis: Secondary | ICD-10-CM

## 2016-10-30 NOTE — Patient Instructions (Addendum)
1. Allergic rhinoconjunctivitis - Continue Nasonex 1-2 sprays per nostril daily. - Continue cetirizine 10mg  daily. - You can take an extra cetirizine if you are having worsening symptoms  2. Moderate persistent asthma - Lung testing looked slightly worse compared to that last visit.  - Since you are not having any symptoms, we can monitor and adjust medications if needed at the last visit.  - We will not make any medication changes today. - Daily controller medication(s): Symbicort 160/4.5 two puffs twice daily with spacer - Rescue medications: ProAir 4 puffs every 4-6 hours as needed - Asthma control goals:  * Full participation in all desired activities (may need albuterol before activity) * Albuterol use two time or less a week on average (not counting use with activity) * Cough interfering with sleep two time or less a month * Oral steroids no more than once a year * No hospitalizations  3. Lip swelling - resolved - Continue to use cetirizine as needed for these episodes.   4. Adverse food reactions (peanuts, soy, shellfish) - Continue to avoid these foods. - EpiPen is up to date.   5. Return in about 3 months (around 01/30/2017).  Please inform us of any Emergency Department visits, hospitalizations, or changes in symptoms. Call us before going to the ED for breathing or allergy symptoms since we might be able to fit you in for a sick visit. Feel free to contact us anytime with any questions, problems, or concerns.  It was a pleasure to see you again today! Happy spring!   Websites that have reliable patient information: 1. American Academy of Asthma, Allergy, and Immunology: www.aaaai.org 2. Food Allergy Research and Education (FARE): foodallergy.org 3. Mothers of Asthmatics: http://www.asthmacommunitynetwork.org 4. American College of Allergy, Asthma, and Immunology: www.acaai.org

## 2016-10-30 NOTE — Progress Notes (Signed)
FOLLOW UP  Date of Service/Encounter:  10/30/16   Assessment:   Chronic nonseasonal allergic rhinitis  Moderate persistent asthma, uncomplicated  Multiple food allergies (peanut, soy, shellfish)  Lip swelling   Plan/Recommendations:   1. Allergic rhinoconjunctivitis - Continue Nasonex 1-2 sprays per nostril daily. - Continue cetirizine 10mg  daily. - You can take an extra cetirizine if you are having worsening symptoms  2. Moderate persistent asthma - Lung testing looked slightly worse compared to that last visit.  - Since you are not having any symptoms, we can monitor and adjust medications if needed at the last visit.  - We will not make any medication changes today. - Daily controller medication(s): Symbicort 160/4.5 two puffs twice daily with spacer - Rescue medications: ProAir 4 puffs every 4-6 hours as needed - Asthma control goals:  * Full participation in all desired activities (may need albuterol before activity) * Albuterol use two time or less a week on average (not counting use with activity) * Cough interfering with sleep two time or less a month * Oral steroids no more than once a year * No hospitalizations  3. Lip swelling - resolved - Continue to use cetirizine as needed for these episodes.   4. Adverse food reactions (peanuts, soy, shellfish) - Continue to avoid these foods. - EpiPen is up to date.   5. Return in about 3 months (around 01/30/2017).   Subjective:   Nicole Holland is a 81 y.o. female presenting today for follow up of  Chief Complaint  Patient presents with  . Allergies  . Asthma    Nicole Holland has a history of the following: Patient Active Problem List   Diagnosis Date Noted  . Moderate persistent asthma 01/24/2016  . Adverse food reaction 01/24/2016  . Allergic rhinoconjunctivitis 01/24/2016  . Chest pain 09/03/2015  . CKD (chronic kidney disease), stage IV ([facility]) 09/03/2015  . Normocytic anemia 09/03/2015  .  Leukocytosis 09/03/2015  . Malignant neoplasm of upper-outer quadrant of right female breast ([facility]) 10/11/2011  . Hypertension 10/25/2010  . Hyperlipidemia 10/25/2010    History obtained from: chart review and patient.  Nicole Holland was referred by Helane Rima, MD.     Nicole Holland is a 81 y.o. female presenting for a follow up visit. She was last seen in November 2017. At that time, she was doing well with her moderate persistent asthma. She was continued on Symbicort 160/4.5 g 2 puffs in the morning and 2 puffs at night. She was endorsing some lip swelling, and I recommended using antihistamines to help with this. She has a history of peanut, soy, and shellfish allergies. I recommended that she stop drinking boost and ensure since they have soy protein as part of that.  Since the last visit, Nicole Holland reports that she is going well. Nicole Holland asthma has been well controlled. She has not required rescue medication, experienced nocturnal awakenings due to lower respiratory symptoms, nor have activities of daily living been limited. She remains on her Symbicort two puffs twice daily. She does not use her rescue inhaler. She denies nighttime coughing. She feels that the pollens might be contributing to her current clinical state.   She remains on her Nasonex two sprays per nostril daily. She is going to have more surgery for her "saggy eyes". She is no longer taking calcium, per recommendations from her nephrologist. She has had no lip swelling since the last time. She has reintroduced her teas. She no longer eats peanut butter,  soy, or shellfish. She is very complaint with reading labels. She has found a nutritional supplement without soy. This one is dark chocolate flavored and she enjoys it immensely.   Otherwise, there have been no changes to her past medical history, surgical history, family history, or social history.    Review of Systems: a 14-point review of systems is pertinent for what  is mentioned in HPI.  Otherwise, all other systems were negative. Constitutional: negative other than that listed in the HPI Eyes: negative other than that listed in the HPI Ears, nose, mouth, throat, and face: negative other than that listed in the HPI Respiratory: negative other than that listed in the HPI Cardiovascular: negative other than that listed in the HPI Gastrointestinal: negative other than that listed in the HPI Genitourinary: negative other than that listed in the HPI Integument: negative other than that listed in the HPI Hematologic: negative other than that listed in the HPI Musculoskeletal: negative other than that listed in the HPI Neurological: negative other than that listed in the HPI Allergy/Immunologic: negative other than that listed in the HPI    Objective:   Blood pressure 132/60, pulse 68, temperature 98.4 F (36.9 C), temperature source Oral, resp. rate 14, height 4\' 11"  (1.499 m), weight 192 lb 12.8 oz (87.5 kg), SpO2 96 %. Body mass index is 38.94 kg/m.   Physical Exam:  General: Alert, interactive, in no acute distress. Pleasant female. Well dressed.  Eyes: No conjunctival injection present on the right, No conjunctival injection present on the left, PERRL bilaterally, No discharge on the right, No discharge on the left and No Horner-Trantas dots present Ears: Right TM pearly gray with normal light reflex, Left TM pearly gray with normal light reflex, Right TM intact without perforation and Left TM intact without perforation.  Nose/Throat: External nose within normal limits and septum midline, turbinates edematous and pale with clear discharge, post-pharynx mildly erythematous without cobblestoning in the posterior oropharynx. Tonsils 2+ without exudates Neck: Supple without thyromegaly. Lungs: Clear to auscultation without wheezing, rhonchi or rales. No increased work of breathing. CV: Normal S1/S2, no murmurs. Capillary refill <2 seconds.  Skin: Warm  and dry, without lesions or rashes. Neuro:   Grossly intact. No focal deficits appreciated. Responsive to questions.   Diagnostic studies:   Spirometry: results abnormal (FEV1: 0.93/67%, FVC: 1.15/64%, FEV1/FVC: 80%).    Spirometry consistent with normal pattern.   Allergy Studies: none    Salvatore Marvel, MD [facility]

## 2016-11-08 ENCOUNTER — Ambulatory Visit (INDEPENDENT_AMBULATORY_CARE_PROVIDER_SITE_OTHER): Payer: Medicare Other | Admitting: Neurology

## 2016-11-08 ENCOUNTER — Encounter: Payer: Self-pay | Admitting: Neurology

## 2016-11-08 VITALS — BP 136/75 | HR 72 | Ht 59.0 in | Wt 191.0 lb

## 2016-11-08 DIAGNOSIS — G7000 Myasthenia gravis without (acute) exacerbation: Secondary | ICD-10-CM | POA: Diagnosis not present

## 2016-11-08 MED ORDER — PYRIDOSTIGMINE BROMIDE 60 MG PO TABS: 30.0000 mg | ORAL_TABLET | Freq: Three times a day (TID) | ORAL | 3 refills | Status: DC

## 2016-11-08 NOTE — Progress Notes (Signed)
[facility]    Provider:  Dr [name] Referring Provider: Monna Fam MD Primary Care Physician:  Helane Rima, MD  CC:  Drooping   HPI:  Nicole Holland is a 81 y.o. 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 She noticed eyelid drooping for a year. The eye symptoms don;t change with time of day, not better in the morning or evening. Pollen makes it worse, night driving is hard. No significant improvement after a nap. The left eye is worse. It is stable. People think she is asleep when she is not actually asleep. No double vision. No difficulty swallowing, breathing, no voice changing. Pollen season has been bad. No voice changes. Her legs try to give out on her but that's been "going on forever" no new muscle or new difficulties lifting arms overhead or climbing stairs. She can comb and style hair, brush teeth, can get out of seats. No neck weakness.   Reviewed notes, labs and imaging from outside physicians, which showed:   Drooping of the right eye and left eye. Patient's [facility]. Eyelids have been drooping for the past year and getting worse, causing I was to feel heavy and rubs slightly as the eyes. She is not able to see as clearly as she used to. She feels is progressively getting worse. Eyes have been tearing and burning which makes vision blurry sometimes. She can't see as clear as she would like to due to her eyelids drooping down in her eyes very bothersome. In driving at night has become a problem. Reviewed exam which showed a 20/60 in 2040 distance 20/50 in 20/30 near, patient was not dilated, intraocular pressure is normal, pupils equally round and reactive to light no APD, open angles, extraocular movements intact, myogenic ptosis, anterior chamber is normal, adnexa normal. They were referred here for EMG. Myasthenia panel was sent and a ACHR binding antibody serum was positive.   Reviewed labs: Receptor binding antibodies  were elevated at 0.29 otherwise blocking antibodies and modulating antibodies were negative.  Review of Systems: Patient complains of symptoms per HPI as well as the following symptoms: no SOB, no CP. Pertinent negatives per HPI. All others negative.   Social History   Social History  . Marital status: Legally Separated    Spouse name: N/A  . Number of children: 4  . Years of education: 12   Occupational History  .      NA   Social History Main Topics  . Smoking status: Never Smoker  . Smokeless tobacco: Never Used  . Alcohol use No  . Drug use: No  . Sexual activity: No     Comment: mensus age 79,G5P4, 87st preg age 58, b.c short time,couldn't tolerate   Other Topics Concern  . Not on file   Social History Narrative   Lives alone   Caffeine- none    Family History  Problem Relation Age of Onset  . Stroke Mother   . Diabetes Mother   . Cancer Mother   . Cancer Father        Lung cancer  . Diabetes Sister   . Cancer Sister        Unknown Cancer  . Diabetes Brother   . Cancer Brother        Unknown cancer  . Diabetes Brother   . Cancer Brother        Unknown cancer  . Cancer Brother        Unknown  Cancer  . Diabetes Brother   . Cancer Brother        Unknown Cancer  . Autoimmune disease Neg Hx     Past Medical History:  Diagnosis Date  . Arthritis   . Asthma   . 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 Gout   . Hyperlipidemia   . Hypertension   . S/P radiation therapy 10/28/11 - 12/02/11   Right Breast/ 5000cGy/ 25 Fractions  . Use of tamoxifen (Nolvadex) 12/04/11   Dr. Truddie Coco  . Vitamin D deficiency     Past Surgical History:  Procedure Laterality Date  . ABDOMINAL HYSTERECTOMY  1994   partial ovaries intact  . BREAST LUMPECTOMY  08/02/11   Lumpectomy right Breast; Low Grade Ductal Carcinoma In Situ with Associated Calcifications, ER 65%, PR 99%  . BREAST SURGERY  02/01/2000   right breast subareolar  core biopsy  . BREAST SURGERY  09/27/11   Right Breast; Excision of  Anterior Margin- Benign Breast Parenchyma, Negative for In situ and Invasive Cancer, Surgical Margins Negative, Benign skin, Negative for Malignancy  . CATARACT EXTRACTION, BILATERAL  2015, 2016  . CESAREAN SECTION    . TONSILLECTOMY      Current Outpatient Prescriptions  Medication Sig Dispense Refill  . acetaminophen (TYLENOL) 650 MG CR tablet Take 1,300 mg by mouth every 8 (eight) hours as needed for pain.    Marland Kitchen albuterol (PROAIR HFA) 108 (90 BASE) MCG/ACT inhaler Inhale 2 puffs into the lungs every 6 (six) hours as needed for wheezing or shortness of breath.     Marland Kitchen atorvastatin (LIPITOR) 20 MG tablet Take 20 mg by mouth at bedtime.  0  . budesonide-formoterol (SYMBICORT) 160-4.5 MCG/ACT inhaler Inhale 2 puffs into the lungs as needed.     . carvedilol (COREG) 6.25 MG tablet Take 12.5 mg by mouth 2 (two) times daily.  0  . cetirizine (ZYRTEC) 10 MG tablet Take 1 tablet (10 mg total) by mouth daily. Reported on 12/07/2015 30 tablet 5  . diphenhydrAMINE (BENADRYL) 25 mg capsule Take by mouth.    . EPINEPHrine (EPIPEN 2-PAK) 0.3 mg/0.3 mL IJ SOAJ injection     . furosemide (LASIX) 40 MG tablet Take 40 mg by mouth daily.  0  . glucose blood (ONE TOUCH ULTRA TEST) test strip TEST once daily    . guaiFENesin (MUCINEX) 600 MG 12 hr tablet Take 600 mg by mouth as needed.     . latanoprost (XALATAN) 0.005 % ophthalmic solution Apply to eye.    . losartan (COZAAR) 25 MG tablet Take 25 mg by mouth daily.  0  . mometasone (NASONEX) 50 MCG/ACT nasal spray Place 2 sprays into the nose daily.      . Multiple Vitamins-Minerals (CENTRUM SILVER PO) Take 1 tablet by mouth daily.     Marland Kitchen olopatadine (PATANOL) 0.1 % ophthalmic solution instill 1 drop into both eyes daily  0  . Olopatadine HCl (PATADAY) 0.2 % SOLN Apply 1 drop to eye as needed (itching).     . Omega-3 Fatty Acids (FISH OIL) 1200 MG CAPS Take 1,200 mg by mouth 2 (two) times daily.      Glory Rosebush DELICA LANCETS 95G MISC TEST ONCE DAILY AS DIRECTED    . Polyethylene Glycol 400 (BLINK TEARS) 0.25 % SOLN Apply 1 drop to eye as needed (dry eyes).     Marland Kitchen ULORIC 40 MG tablet Take 40 mg by mouth  daily.   0  . vitamin C (ASCORBIC ACID) 500 MG tablet Take 500 mg by mouth daily.      . Vitamins/Minerals TABS Take by mouth.    . pyridostigmine (MESTINON) 60 MG tablet Take 0.5 tablets (30 mg total) by mouth 3 (three) times daily. 30 tablet 3   No current facility-administered medications for this visit.     Allergies as of 11/08/2016 - Review Complete 11/08/2016  Allergen Reaction Noted  . Iodides Itching 10/25/2010  . Penicillins Itching 10/25/2010  . Peanut oil Other (See Comments) 01/04/2016  . Shellfish allergy Other (See Comments) 01/04/2016  . Soybean-containing drug products Other (See Comments) 01/04/2016  . Vaccinium angustifolium Other (See Comments) 01/04/2016    Vitals: BP 136/75   Pulse 72   Ht 4\' 11"  (1.499 m)   Wt 191 lb (86.6 kg)   BMI 38.58 kg/m  Last Weight:  Wt Readings from Last 1 Encounters:  11/08/16 191 lb (86.6 kg)   Last Height:   Ht Readings from Last 1 Encounters:  11/08/16 4\' 11"  (1.499 m)   Physical exam: Exam: Gen: NAD, conversant, well nourised, obese, well groomed                     CV: RRR, +SEM. No Carotid Bruits. No peripheral edema, warm, nontender Eyes: Conjunctivae clear without exudates or hemorrhage  Neuro: Detailed Neurologic Exam  Speech:    Speech is normal; fluent and spontaneous with normal comprehension.  Cognition:    The patient is oriented to person, place, and time;     recent and remote memory intact;     language fluent;     normal attention, concentration,     fund of knowledge Cranial Nerves:    The pupils are equal, round, and reactive to light. Attempted fundoscopic exam could not visualize. Visual fields are full to finger confrontation. Impaired left upgaze with fatiguability. Trigeminal  sensation is intact and the muscles of mastication are normal. Left> right ptosis. Left to mid pupil right just above pupil. The palate elevates in the midline. Hearing intact. Voice is normal. Shoulder shrug is normal. The tongue has normal motion without fasciculations.   Coordination:    No dysmetria  Gait:  Can get on heels and toes, gait mild waddling  Motor Observation:    can get out of chair without using hands. no involuntary movements noted. Tone:    Normal muscle tone.    Posture:    Posture is normal. normal erect    Strength: Bilateral biceps 3+/5 Right triceps 4+ left triceps 4 bilat hip flexion 4+/5 Otherwise intact      Sensation: intact to LT     Reflex Exam:  DTR's:    Deep tendon reflexes in the upper and lower extremities are brisk bilaterally.   Toes:    The toes are downgoing bilaterally.   Clonus:    Clonus is absent.  +ice pack test  Assessment/Plan:  81 year old with left > right ptosis, fatiguable upgaze, proximal weakness, +ice pack test and myasthenia gravis +ab. C/w myasthenia gravis.   - Will hold off on emg/ncs, don't think we need an emg/ncs to diagnose myasthenia gravis in this case - Start Mestinon 30mg  3x a day and increase to 60 as tolerated. - CT of the chest to eval for thymoma.   -We had a discussion about myasthenia gravis and different medication strategies, possible adverse effects and strategies to manage them, expected time course of  benefits in medication management which include hours to days for Mestinon, weeks to many months for prednisone, and many months to a year more for Azathioprine and other steroid-sparing agents. I'm not sure patient had a good understanding and there may be some baseline memory loss. Asked if her son could come to next appointment,  - In mild to moderate generalized myasthenia gravis, Mestinon may be the only treatment. A cholinergic crisis, in which weakness is worsened by increased doses of the  Mestinon, rarely occurs. The concomitant use of prednisone and sometimes another immunosuppressant is often required. In more severe myasthenia gravis, immunosuppression and sometimes immunomodulation should be started at the same time as Mestinon.  - Discussed low dose daily or alternate day prednisone. We would not want to start high as this can worsen myasthenia gravis initially. This usually starts about 4-5 days after beginning prednisone and last 4-7 days before improvement occurs. Would start low-dose possibly 10 mg a day with increases every 3-5 days in 10 mg steps until the desired dose is reached. The use of IVIG when prednisone is started May prevent initial worsening. More than 1 mg/kg per day rarely required for prednisone dosage. Usually takes 3-6 months before maximum benefit occurs. I will discuss again with her at next appointment.  Mild generalized myasthenia gravis, Azathioprine can be used. This may take 12-18 months before optimal benefit is seen. May start prednisone along with this. Would start this medication at 25 mg a day with increases every 2 weeks to 50 mg, 100 mg and 150 mg a day. Some will experience flulike reaction within the first 2 weeks. Monitor ALT, AST and GGT and a complete blood count and differential weekly for the first 8 weeks and monthly thereafter. Hepatotoxicity usually mild and reversible occurs and 15% and myelosuppression and 10% about 6 weeks after starting. Both will resolve after a dose reduction but discontinuation may be required. Neutropenia is the main concern whereas lymphopenia and macrocytosis are common and benign findings that are seen with monitoring blood work with long-term use. After 6-12 months if no improvement occurs on the highest dose it can be increased in 50mg  steps every 3-6 months to a maximum of 2.5-3 mg/kg per day. This may increase the risk of dermatologic malignancies or other malignancies. For risk of previous skin cancers  mycophenolate may pose a lower risk.  Mycophenolate is another option and likely takes at least 6 months and perhaps longer to produce significant benefit. Other immunosuppressants used include cyclosporine, tacrolimus, methotrexate and cyclophosphamide. For severe disease, may consider rituximab were refractory to the usual treatments.  Medications that worsen myasthenia gravis include beta blockers and calcium channel blockers, and aminoglycosides, macrolides, or fkuoroquinolones and people should avoid not polarizing neuromuscular blocking agents and are less sensitive to depolarizing neuromuscular blocking agents. Spinal or local anesthetics are generally safer options.  Go to the emergency room immediately if you experience worsening symptoms especially breathing difficulties.   F/u 3-4 weeks to discuss again. Patient may have baseline memory difficulties. Asked if she had someone who helps her, she has a son and asked if he could come to next exam.  Sarina Ill, MD  [facility] [address] [place], [place] [address]  Phone [phone] Fax [phone]

## 2016-11-08 NOTE — Patient Instructions (Addendum)
Remember to drink plenty of fluid, eat healthy meals and do not skip any meals. Try to eat protein with a every meal and eat a healthy snack such as fruit or nuts in between meals. Try to keep a regular sleep-wake schedule and try to exercise daily, particularly in the form of walking, 20-30 minutes a day, if you can.   As far as your medications are concerned, I would like to suggest: Mestinon (Pyridoxine) 1/2 pill 3x a day 4 hours apart  As far as diagnostic testing: Imaging of the chest and possibly the brain we will call to schedule  I would like to see you back in 4 weeks, sooner if we need to. Please call us with any interim questions, concerns, problems, updates or refill requests.   Our phone number is [phone]. We also have an after hours call service for urgent matters and there is a physician on-call for urgent questions. For any emergencies you know to call 911 or go to the nearest emergency room  Myasthenia Gravis Myasthenia gravis (MG) means severe weakness. It is a long-term (chronic) condition that causes weakness in the muscles you can control (voluntary muscles). MG can affect any voluntary muscle. The muscles most often affected are the ones that control:  Eye movement.  Facial movements.  Swallowing. MG is an autoimmune disease, which means that your body's defense system (immune system) attacks healthy parts of your body instead of germs and other things that make you sick. When you have MG, your immune system makes proteins (antibodies) that block the chemical (acetylcholine) your body needs to send nerve signals to your muscles. This causes muscle weakness. What are the causes? The exact cause of MG is unknown. One possible cause is an enlarged thymus gland, which is located under your breastbone. What are the signs or symptoms? The earliest symptom of MG is muscle weakness that gets worse with activity and gets better after rest. Other symptoms of MG may  include:  Drooping eyelids.  Double vision.  Loss of facial expression.  Trouble chewing and swallowing.  Slurred speech.  A waddling walk.  Weakness of the arms, hands, and legs. Trouble breathing is the most dangerous symptom of MG. Sudden and severe difficulty breathing (myasthenic crisis) may require emergency breathing support. This symptom sometimes happens after:  Infection.  Fever.  Drug reaction. How is this diagnosed? It can be hard to diagnose MG because muscle weakness is a common symptom in many conditions. Your health care provider will do a physical exam. You may also have tests that will help make a diagnosis. These may include:  A blood test.  A test using the medicine edrophonium. This medicine increases muscle strength by slowing the breakdown of acetylcholine.  Tests to measure nerve conduction to muscle (electromyography).  An imaging study of the chest (CT or MRI). How is this treated? Treatment can improve muscle strength. Sometimes symptoms of MG go away for a while (remission) and you can stop treatment. Possible treatments include:  Medicine.  Removal of the thymus gland (thymectomy). This may result in a long remission for some people. Follow these instructions at home:  Take medicines only as directed by your health care provider.  Get plenty of rest to conserve your energy.  Take frequent breaks to rest your eyes.  Maintain a healthy diet and a healthy weight.  Do not use any tobacco products including cigarettes, chewing tobacco, or electronic cigarettes. If you need help quitting, ask your health care  provider.  Keep all follow-up visits as directed by your health care provider. This is important. Contact a health care provider if:  Your symptoms get worse after a fever or infection.  You have a reaction to a medicine you are taking.  Your symptoms change or get worse. Get help right away if: You have trouble breathing. This  information is not intended to replace advice given to you by your health care provider. Make sure you discuss any questions you have with your health care provider. Document Released: 09/09/2000 Document Revised: 11/09/2015 Document Reviewed: 08/04/2013 Elsevier Interactive Patient Education  2017 [employer].   Pyridostigmine tablets, extended-release What is this medicine? PYRIDOSTIGMINE (peer id oh STIG meen) can help with muscle strength. It is used to treat myasthenia gravis. This medicine may be used for other purposes; ask your health care provider or pharmacist if you have questions. COMMON BRAND NAME(S): Mestinon What should I tell my health care provider before I take this medicine? They need to know if you have any of these conditions: -asthma -difficulty passing urine -heart disease -infection in abdomen, peritonitis -irregular, slow heartbeat -kidney disease -seizures -stomach or bowel obstruction or ulcers -thyroid disease -an unusual or allergic reaction to pyridostigmine, bromides, other medicines, foods, dyes, or preservatives -pregnant or trying to get pregnant -breast-feeding How should I use this medicine? Take this medicine by mouth with a glass of water. Follow the directions on the prescription label. Do not crush or chew. Take your medicine at regular intervals. Do not take your medicine more often than directed. Do not stop taking except on your doctor's advice. Talk to your pediatrician regarding the use of this medicine in children. Special care may be needed. Overdosage: If you think you have taken too much of this medicine contact a poison control center or emergency room at once. NOTE: This medicine is only for you. Do not share this medicine with others. What if I miss a dose? If you miss a dose, take it as soon as you can. If it is almost time for your next dose, take only that dose. Do not take double or extra doses. What may interact with this  medicine? Do not take this medicine with any of the following medications: -other medicines for myasthenia gravis like neostigmine -quinine This medicine may also interact with the following medications: -atropine -bethanechol -disopyramide -edrophonium -guanadrel -guanethidine -mecamylamine -medicines that block muscle or nerve pain This list may not describe all possible interactions. Give your health care provider a list of all the medicines, herbs, non-prescription drugs, or dietary supplements you use. Also tell them if you smoke, drink alcohol, or use illegal drugs. Some items may interact with your medicine. What should I watch for while using this medicine? Visit your doctor or health care professional for regular checks on your progress. Tell your doctor if your symptoms do not improve or if they get worse. Wear a medical ID bracelet or chain, and carry a card that describes your disease and details of your medicine and dosage times. What side effects may I notice from receiving this medicine? Side effects that you should report to your doctor or health care professional as soon as possible: -allergic reactions like skin rash, itching or hives, swelling of the face, lips, or tongue -breathing problems -changes in vision -muscle cramps, spasm -slow or irregular heartbeat -stomach cramps, pain -unusually weak or tired -vomiting Side effects that usually do not require medical attention (report to your doctor or health care  professional if they continue or are bothersome): -diarrhea, especially at start of treatment -increased saliva -increased sweating -nausea This list may not describe all possible side effects. Call your doctor for medical advice about side effects. You may report side effects to FDA at 1-800-FDA-1088. Where should I keep my medicine? Keep out of the reach of children. Store at room temperature between 15 and 30 degrees C (59 and 86 degrees F). Keep  container tightly closed. Protect from moisture. Throw away any unused medicine after the expiration date. NOTE: This sheet is a summary. It may not cover all possible information. If you have questions about this medicine, talk to your doctor, pharmacist, or health care provider.  2018 Elsevier/Gold Standard (2008-01-08 11:19:07)

## 2016-11-11 ENCOUNTER — Encounter: Payer: Self-pay | Admitting: Neurology

## 2016-11-11 DIAGNOSIS — G7000 Myasthenia gravis without (acute) exacerbation: Secondary | ICD-10-CM | POA: Insufficient documentation

## 2016-11-13 ENCOUNTER — Telehealth: Payer: Self-pay | Admitting: Neurology

## 2016-11-13 NOTE — Telephone Encounter (Signed)
Pt said she has been scheduled for a CT scan of her chest, she would like a call back as to why she is having this scan

## 2016-11-13 NOTE — Telephone Encounter (Addendum)
Called pt back and let her know that the CT of the chest is to evaluate for thymoma which can frequently be associated myasthenia gravis; thymoma is found in about 20% of patients with MG. A thymoma is a tumor originating from the epithelial cells of the thymus that may be benign or malignant.  She also voiced concern about scheduling eye surgery for ptosis. Would like Dr. Cathren Laine advice on whether she should give the Mestinon more time to see if surgery is even necessary. It looks like she's currently scheduled for internal ptosis repair on 11/28/16.

## 2016-11-14 NOTE — Telephone Encounter (Signed)
Ophthalmic surgeon is Dr. Clista Bernhardt. She is with [facility] T # [phone].

## 2016-11-14 NOTE — Telephone Encounter (Signed)
Delsa Sale, find out who she is scheduled to have the surgery with and I will call the doctor and discuss it with him.

## 2016-11-18 ENCOUNTER — Other Ambulatory Visit: Payer: Medicare Other

## 2016-11-20 ENCOUNTER — Ambulatory Visit
Admission: RE | Admit: 2016-11-20 | Discharge: 2016-11-20 | Disposition: A | Discharge status: Home / Self Care | Payer: Medicare Other | Source: Ambulatory Visit | Attending: Neurology | Admitting: Neurology

## 2016-11-20 DIAGNOSIS — G7000 Myasthenia gravis without (acute) exacerbation: Secondary | ICD-10-CM

## 2016-11-20 NOTE — Telephone Encounter (Signed)
I've left a message, doctor has been out of the office for the last week for board studying. She will call my cell when she is back in the office thanks

## 2016-11-21 ENCOUNTER — Telehealth: Payer: Self-pay | Admitting: Neurology

## 2016-11-21 NOTE — Telephone Encounter (Signed)
Pt called said she needs to get refill for pyridostigmine (MESTINON) 60 MG tablet , she is taking 1/2 tab 3 x day as directed but the RX is written to dispense a qty of #30.  Pt said she is having stomach cramps and diarreah, she wants to know how long will she need to the medication. She is going to a Dr's appt today, can LVM if she is not available.

## 2016-11-21 NOTE — Telephone Encounter (Signed)
Discussed w/ Dr. [name] and called pt back. Recommended that she stop medication for now due to side effects. Let her know that Dr. [name] still plans on talking w/ eye surgeon when she returns to the office next week. Verbalized understanding and appreciation for call.

## 2016-11-26 ENCOUNTER — Telehealth: Payer: Self-pay | Admitting: *Deleted

## 2016-11-26 NOTE — Telephone Encounter (Signed)
Called and spoke with pt about CT results per AA,MD note. She verbalized understanding.   She has some further questions about surgery on scheduled for Thursday (ptosis). She is wondering if Dr [name] had spoken with Dr. Durward Fortes I will send message to Dr [name] nurse. She would like to be called tonight if possible.

## 2016-11-26 NOTE — Telephone Encounter (Signed)
-----   Message from Melvenia Beam, MD sent at 11/21/2016 12:16 PM EDT ----- CT of the chest did not show any thymoma or tumor thanks

## 2016-11-26 NOTE — Telephone Encounter (Addendum)
We spoke today, it isn;t a good idea for her to get the surgery. The physician is going to call patient back tomorrow. Also I have f/u scheduled with patient, I don;t think she understands what is going on and she needs to bring family with her to next appointment thanks

## 2016-11-26 NOTE — Telephone Encounter (Signed)
I spoke to doctor and its not a good idea for her to get the surgery, her physician is going to talk toher. I have follow up with paytient and she needs to bring family with her because I don;t think she understands everything that is gong on there may be dementia. thanks

## 2016-11-28 ENCOUNTER — Encounter (HOSPITAL_COMMUNITY): Admission: RE | Payer: Self-pay | Source: Ambulatory Visit

## 2016-11-28 ENCOUNTER — Ambulatory Visit (HOSPITAL_COMMUNITY)
Admission: RE | Admit: 2016-11-28 | Payer: Medicare Other | Source: Ambulatory Visit | Admitting: Oculoplastics Ophthalmology

## 2016-11-28 SURGERY — REPAIR, BLEPHAROPTOSIS
Anesthesia: LOCAL | Laterality: Bilateral

## 2016-12-06 ENCOUNTER — Ambulatory Visit (INDEPENDENT_AMBULATORY_CARE_PROVIDER_SITE_OTHER): Payer: Medicare Other | Admitting: Neurology

## 2016-12-06 ENCOUNTER — Encounter: Payer: Self-pay | Admitting: Neurology

## 2016-12-06 VITALS — BP 132/73 | HR 71 | Wt 193.0 lb

## 2016-12-06 DIAGNOSIS — G7000 Myasthenia gravis without (acute) exacerbation: Secondary | ICD-10-CM

## 2016-12-06 MED ORDER — PREDNISONE 10 MG PO TABS: 10.0000 mg | ORAL_TABLET | Freq: Every day | ORAL | 2 refills | Status: DC

## 2016-12-06 NOTE — Progress Notes (Signed)
[facility]    Provider:  Dr Nicole Holland Referring Provider: Helane Rima, MD Primary Care Physician:  Nicole Rima, MD  CC:  Myasthenia Gravis  Interval History 12/09/2016:  Nicole Holland is a 81 y.o. female here as a referral from Dr. Lavone Holland for Myasthenia Gravis.ACHR binding antibody serum was positive.  I had a long talk with her at last appointment however I don't the patient understands there may be memory problems. Asked her son to come along with her that she is here alone again today. I asked her to explain to me what is going on and she said "I really don't know what is going on". She had a long appointment with the ophthalmologist to discuss MG and also I spoke to her about MG but appears she has memory problems and a vague understanding.   Nicole Holland [facility]  HPI:  Nicole Holland is a 81 y.o. female here as a referral from Dr. Lavone Holland for myogenic ptosis. She noticed eyelid drooping for a year. The eye symptoms don;t change with time of day, not better in the morning or evening. Pollen makes it worse, night driving is hard. No significant improvement after a nap. The left eye is worse. It is stable. People think she is asleep when she is not actually asleep. No double vision. No difficulty swallowing, breathing, no voice changing. Pollen season has been bad. No voice changes. Her legs try to give out on her but that's been "going on forever" no new muscle or new difficulties lifting arms overhead or climbing stairs. She can comb and style hair, brush teeth, can get out of seats. No neck weakness.   Reviewed notes, labs and imaging from outside physicians, which showed:   Drooping of the right eye and left eye. Patient's [facility]. Eyelids have been drooping for the past year and getting worse, causing I was to feel heavy and rubs slightly as the eyes. She is not able to see as clearly as she used to. She feels is  progressively getting worse. Eyes have been tearing and burning which makes vision blurry sometimes. She can't see as clear as she would like to due to her eyelids drooping down in her eyes very bothersome. In driving at night has become a problem. Reviewed exam which showed a 20/60 in 2040 distance 20/50 in 20/30 near, patient was not dilated, intraocular pressure is normal, pupils equally round and reactive to light no APD, open angles, extraocular movements intact, myogenic ptosis, anterior chamber is normal, adnexa normal. They were referred here for EMG. Myasthenia panel was sent and a ACHR binding antibody serum was positive.   Reviewed labs: Receptor binding antibodies were elevated at 0.29 otherwise blocking antibodies and modulating antibodies were negative.  Review of Systems: Patient complains of symptoms per HPI as well as the following symptoms: no SOB, no CP. Pertinent negatives per HPI. All others negative.  Social History   Social History  . Marital status: Legally Separated    Spouse name: N/A  . Number of children: 4  . Years of education: 12   Occupational History  .      NA   Social History Main Topics  . Smoking status: Never Smoker  . Smokeless tobacco: Never Used  . Alcohol use No  . Drug use: No  . Sexual activity: No     Comment: mensus age 70,G5P4, 65st preg age 42, b.c short time,couldn't tolerate   Other Topics Concern  .  Not on file   Social History Narrative   Lives alone   Caffeine- none    Family History  Problem Relation Age of Onset  . Stroke Mother   . Diabetes Mother   . Cancer Mother   . Cancer Father        Lung cancer  . Diabetes Sister   . Cancer Sister        Unknown Cancer  . Diabetes Brother   . Cancer Brother        Unknown cancer  . Diabetes Brother   . Cancer Brother        Unknown cancer  . Cancer Brother        Unknown Cancer  . Diabetes Brother   . Cancer Brother        Unknown Cancer  . Autoimmune disease Neg  Hx     Past Medical History:  Diagnosis Date  . Arthritis   . Asthma   . 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 Gout   . Hyperlipidemia   . Hypertension   . S/P radiation therapy 10/28/11 - 12/02/11   Right Breast/ 5000cGy/ 25 Fractions  . Use of tamoxifen (Nolvadex) 12/04/11   Dr. Truddie Coco  . Vitamin D deficiency     Past Surgical History:  Procedure Laterality Date  . ABDOMINAL HYSTERECTOMY  1994   partial ovaries intact  . BREAST LUMPECTOMY  08/02/11   Lumpectomy right Breast; Low Grade Ductal Carcinoma In Situ with Associated Calcifications, ER 65%, PR 99%  . BREAST SURGERY  02/01/2000   right breast subareolar core biopsy  . BREAST SURGERY  09/27/11   Right Breast; Excision of  Anterior Margin- Benign Breast Parenchyma, Negative for In situ and Invasive Cancer, Surgical Margins Negative, Benign skin, Negative for Malignancy  . CATARACT EXTRACTION, BILATERAL  2015, 2016  . CESAREAN SECTION    . TONSILLECTOMY      Current Outpatient Prescriptions  Medication Sig Dispense Refill  . acetaminophen (TYLENOL) 650 MG CR tablet Take 1,300 mg by mouth every 8 (eight) hours as needed for pain.    Marland Kitchen albuterol (PROAIR HFA) 108 (90 BASE) MCG/ACT inhaler Inhale 2 puffs into the lungs every 6 (six) hours as needed for wheezing or shortness of breath.     Marland Kitchen atorvastatin (LIPITOR) 20 MG tablet Take 20 mg by mouth at bedtime.  0  . budesonide-formoterol (SYMBICORT) 160-4.5 MCG/ACT inhaler Inhale 2 puffs into the lungs as needed.     . carvedilol (COREG) 6.25 MG tablet Take 12.5 mg by mouth 2 (two) times daily.  0  . cetirizine (ZYRTEC) 10 MG tablet Take 1 tablet (10 mg total) by mouth daily. Reported on 12/07/2015 30 tablet 5  . diphenhydrAMINE (BENADRYL) 25 mg capsule Take by mouth.    . EPINEPHrine (EPIPEN 2-PAK) 0.3 mg/0.3 mL IJ SOAJ injection     . furosemide (LASIX) 40 MG tablet Take 40 mg by mouth daily.  0  . glucose blood (ONE TOUCH ULTRA  TEST) test strip TEST once daily    . guaiFENesin (MUCINEX) 600 MG 12 hr tablet Take 600 mg by mouth as needed.     . latanoprost (XALATAN) 0.005 % ophthalmic solution Apply to eye.    . losartan (COZAAR) 25 MG tablet Take 25 mg by mouth daily.  0  . mometasone (NASONEX) 50 MCG/ACT nasal spray Place 2 sprays into the nose daily.      Marland Kitchen  Multiple Vitamins-Minerals (CENTRUM SILVER PO) Take 1 tablet by mouth daily.     Marland Kitchen olopatadine (PATANOL) 0.1 % ophthalmic solution instill 1 drop into both eyes daily  0  . Olopatadine HCl (PATADAY) 0.2 % SOLN Apply 1 drop to eye as needed (itching).     . Omega-3 Fatty Acids (FISH OIL) 1200 MG CAPS Take 1,200 mg by mouth 2 (two) times daily.     Glory Rosebush DELICA LANCETS 02R MISC TEST ONCE DAILY AS DIRECTED    . Polyethylene Glycol 400 (BLINK TEARS) 0.25 % SOLN Apply 1 drop to eye as needed (dry eyes).     . vitamin C (ASCORBIC ACID) 500 MG tablet Take 500 mg by mouth daily.       No current facility-administered medications for this visit.     Allergies as of 12/06/2016 - Review Complete 12/06/2016  Allergen Reaction Noted  . Iodides Itching 10/25/2010  . Peanut oil Other (See Comments) 01/04/2016  . Shellfish allergy Other (See Comments) 01/04/2016  . Soybean-containing drug products Other (See Comments) 01/04/2016  . Vaccinium angustifolium Other (See Comments) 01/04/2016  . Mestinon [pyridostigmine bromide] Diarrhea 12/06/2016  . Penicillins Itching 10/25/2010    Vitals: BP 132/73   Pulse 71   Wt 193 lb (87.5 kg)   BMI 38.98 kg/m  Last Weight:  Wt Readings from Last 1 Encounters:  12/06/16 193 lb (87.5 kg)   Last Height:   Ht Readings from Last 1 Encounters:  11/08/16 4\' 11"  (1.499 m)    Physical exam: Exam: Gen: NAD, conversant, well nourised, obese, well groomed                     CV: RRR, +SEM. No Carotid Bruits. No peripheral edema, warm, nontender Eyes: Conjunctivae clear without exudates or hemorrhage  Neuro: Detailed  Neurologic Exam  Speech:    Speech is normal; fluent and spontaneous with normal comprehension.  Cognition:    The patient is oriented to person, place, and time;     recent and remote memory intact;     language fluent;     normal attention, concentration,     fund of knowledge Cranial Nerves:    The pupils are equal, round, and reactive to light. Attempted fundoscopic exam could not visualize. Visual fields are full to finger confrontation. Impaired left upgaze with fatiguability. Trigeminal sensation is intact and the muscles of mastication are normal. Left> right ptosis. Left to mid pupil right just above pupil. The palate elevates in the midline. Hearing intact. Voice is normal. Shoulder shrug is normal. The tongue has normal motion without fasciculations.   Coordination:    No dysmetria  Gait:  Can get on heels and toes, gait mild waddling  Motor Observation:    can get out of chair without using hands. no involuntary movements noted. Tone:    Normal muscle tone.    Posture:    Posture is normal. normal erect    Strength: Bilateral biceps 3+/5 Right triceps 4+ left triceps 4 bilat hip flexion 4+/5 Otherwise intact      Sensation: intact to LT     Reflex Exam:  DTR's:    Deep tendon reflexes in the upper and lower extremities are brisk bilaterally.   Toes:    The toes are downgoing bilaterally.   Clonus:    Clonus is absent.  +ice pack test      Assessment/Plan:  81 year old with seropositive myasthenia gravis, acetylcholine receptor binding antibody positive    -  Will hold off on emg/ncs, don't think we need an emg/ncs to diagnose myasthenia gravis in this case -  Did not tolerate Mestinon 30mg  3x a day - CT of the chest to eval for thymoma: Negative - 07/18/2016: creatinine 1.79, BUN 33, will recheck and discuss starting cellcept with daughter-in-law(Daughter in law: Velva Harman [phone]) and nephrologist  Prednisone 10 mg taken the morning with  food daily watch for GI upset. In 1-2 weeks as long as no side effects or worsening we may increase please email me.  Mycophenolate (Cellcept):   Week one : one pill (250mg  twice a day Week two: One pill in the morning (250mg ) and 2 pills in the evening (500mg ) Week three: Two pills(500mg ) twice daily  Needs labs weekly for 4 weeks and then biweekly for 8 weeks and then monthly  Will start Cellcept, discussed side effects of patient.. Likely takes at least 6 months and perhaps longer to produce significant benefit. There is evidence that mycophenolate has a more rapid onset of clinical effect. The medicine comes in 250 mg and 500 mg tablets. The standard dose is 1000 mg twice a day. Some people need a higher dose of 1500 mg twice a day. The primary side effect that you might notice is gastrointestinal upset. To help overcome this, you should begin the medicine at one 250 mg tablet twice a day for the first five days and increase by adding one tablet morning and evening every five days until you get to the total dose of four tablets twice a day.  Labs weekly for 4 weeks, then biweekly for 4 weeks and monthly afterwards. Discussed all side effects as per patient instructions and above including risk of rare brain disease called PML.  The adverse effects of corticosteroids are numerous, well known, and largely dose-dependent. These include: hypertension, fluid retention, weight gain, potassium loss, hyperlipidemia, diabetes mellitus, osteoporosis, gastric ulceration, cataracts, glaucoma, moon facies, obesity, acne, skin friability, juvenile growth suppression, and mood/personality changes. Individuals at particular risk for side effects include those who are diabetic or glucose intolerant, obese, hypertensive, osteoporotic or post-menopausal, and those with affective or thought disorders. An alternative immune modulator may be considered in such patients.  Discussed that shouldshe have acute  worsening especially shortness of breath patient should call 911 and be transported to the emergency room due to risks for myasthenia gravis exacerbation and respiratory failure.  Sarina Ill, MD  [facility] [address] [place], [place] [address]  Phone [phone] Fax [phone]  A total of 30 minutes was spent face-to-face with this patient. Over half this time was spent on counseling patient on the myasthenia gravis diagnosis and different diagnostic and therapeutic options available.

## 2016-12-06 NOTE — Patient Instructions (Addendum)
Start Prednisone 10mg  in the morning with food   After I speak to your kidney doctor we can start Cellcept as below.  Mycophenolate (Cellcept):    Week one : one pill (250mg  twice a day Week two: One pill in the morning (250mg ) and 2 pills in the evening (500mg ) Week three: Two pills(500mg ) twice daily NEED TO DISCUSS WITH KIDNEY DOCTOR   Needs labs weekly for 4 weeks and then biweekly for 8 weeks and then monthly  Prednisone tablets What is this medicine? PREDNISONE (PRED ni sone) is a corticosteroid. It is commonly used to treat inflammation of the skin, joints, lungs, and other organs. Common conditions treated include asthma, allergies, and arthritis. It is also used for other conditions, such as blood disorders and diseases of the adrenal glands. This medicine may be used for other purposes; ask your health care provider or pharmacist if you have questions. COMMON BRAND NAME(S): Deltasone, Predone, Sterapred, Sterapred DS What should I tell my health care provider before I take this medicine? They need to know if you have any of these conditions: -Cushing's syndrome -diabetes -glaucoma -heart disease -high blood pressure -infection (especially a virus infection such as chickenpox, cold sores, or herpes) -kidney disease -liver disease -mental illness -myasthenia gravis -osteoporosis -seizures -stomach or intestine problems -thyroid disease -an unusual or allergic reaction to lactose, prednisone, other medicines, foods, dyes, or preservatives -pregnant or trying to get pregnant -breast-feeding How should I use this medicine? Take this medicine by mouth with a glass of water. Follow the directions on the prescription label. Take this medicine with food. If you are taking this medicine once a day, take it in the morning. Do not take more medicine than you are told to take. Do not suddenly stop taking your medicine because you may develop a severe reaction. Your doctor will  tell you how much medicine to take. If your doctor wants you to stop the medicine, the dose may be slowly lowered over time to avoid any side effects. Talk to your pediatrician regarding the use of this medicine in children. Special care may be needed. Overdosage: If you think you have taken too much of this medicine contact a poison control center or emergency room at once. NOTE: This medicine is only for you. Do not share this medicine with others. What if I miss a dose? If you miss a dose, take it as soon as you can. If it is almost time for your next dose, talk to your doctor or health care professional. You may need to miss a dose or take an extra dose. Do not take double or extra doses without advice. What may interact with this medicine? Do not take this medicine with any of the following medications: -metyrapone -mifepristone This medicine may also interact with the following medications: -aminoglutethimide -amphotericin B -aspirin and aspirin-like medicines -barbiturates -certain medicines for diabetes, like glipizide or glyburide -cholestyramine -cholinesterase inhibitors -cyclosporine -digoxin -diuretics -ephedrine -female hormones, like estrogens and birth control pills -isoniazid -ketoconazole -NSAIDS, medicines for pain and inflammation, like ibuprofen or naproxen -phenytoin -rifampin -toxoids -vaccines -warfarin This list may not describe all possible interactions. Give your health care provider a list of all the medicines, herbs, non-prescription drugs, or dietary supplements you use. Also tell them if you smoke, drink alcohol, or use illegal drugs. Some items may interact with your medicine. What should I watch for while using this medicine? Visit your doctor or health care professional for regular checks on your progress. If you  are taking this medicine over a prolonged period, carry an identification card with your name and address, the type and dose of your  medicine, and your doctor's name and address. This medicine may increase your risk of getting an infection. Tell your doctor or health care professional if you are around anyone with measles or chickenpox, or if you develop sores or blisters that do not heal properly. If you are going to have surgery, tell your doctor or health care professional that you have taken this medicine within the last twelve months. Ask your doctor or health care professional about your diet. You may need to lower the amount of salt you eat. This medicine may affect blood sugar levels. If you have diabetes, check with your doctor or health care professional before you change your diet or the dose of your diabetic medicine. What side effects may I notice from receiving this medicine? Side effects that you should report to your doctor or health care professional as soon as possible: -allergic reactions like skin rash, itching or hives, swelling of the face, lips, or tongue -changes in emotions or moods -changes in vision -depressed mood -eye pain -fever or chills, cough, sore throat, pain or difficulty passing urine -increased thirst -swelling of ankles, feet Side effects that usually do not require medical attention (report to your doctor or health care professional if they continue or are bothersome): -confusion, excitement, restlessness -headache -nausea, vomiting -skin problems, acne, thin and shiny skin -trouble sleeping -weight gain This list may not describe all possible side effects. Call your doctor for medical advice about side effects. You may report side effects to FDA at 1-800-FDA-1088. Where should I keep my medicine? Keep out of the reach of children. Store at room temperature between 15 and 30 degrees C (59 and 86 degrees F). Protect from light. Keep container tightly closed. Throw away any unused medicine after the expiration date. NOTE: This sheet is a summary. It may not cover all possible  information. If you have questions about this medicine, talk to your doctor, pharmacist, or health care provider.  2018 Elsevier/Gold Standard (2011-01-17 10:57:14)   Mycophenolate capsules What is this medicine? MYCOPHENOLATE MOFETIL (mye koe FEN oh late MOE fe til) is used to decrease the immune system's response to a transplanted organ. This medicine may be used for other purposes; ask your health care provider or pharmacist if you have questions. COMMON BRAND NAME(S): CellCept What should I tell my health care provider before I take this medicine? They need to know if you have any of these conditions: -anemia or other blood disorder -diarrhea -immune system problems -infection -kidney disease -phenylketonuria -stomach problems -an unusual or allergic reaction to mycophenolate mofetil, other medicines, foods, dyes, or preservatives -pregnant or trying to get pregnant -breast-feeding How should I use this medicine? Take this medicine by mouth with a full glass of water. Follow the directions on the prescription label. Take this medicine on an empty stomach, at least 1 hour before or 2 hours after food. Do not take with food unless your doctor approves. Swallow the medicine whole. Do not cut, crush, or chew the medicine. If the medicine is broken or is not intact, do not get the powder on your skin or eyes. If contact occurs, rinse thoroughly with water. Take your medicine at regular intervals. Do not take your medicine more often than directed. Do not stop taking except on your doctor's advice. A special MedGuide will be given to you by the pharmacist  with each prescription and refill. Be sure to read this information carefully each time. Talk to your pediatrician regarding the use of this medicine in children. Special care may be needed. Overdosage: If you think you have taken too much of this medicine contact a poison control center or emergency room at once. NOTE: This medicine is  only for you. Do not share this medicine with others. What if I miss a dose? If you miss a dose, take it as soon as you can. If it is almost time for your next dose, take only that dose. Do not take double or extra doses. What may interact with this medicine? -acyclovir or valacyclovir -antacids -azathioprine -birth control pills -certain antibiotics like ciprofloxacin and amoxicillin; clavulanic acid -ganciclovir or valganciclovir -lanthanum carbonate -medicines for cholesterol like cholestyramine and colestipol -metronidazole -norfloxacin -other mycophenolate medicines -probenecid -rifampin -sevelamer -vaccines This list may not describe all possible interactions. Give your health care provider a list of all the medicines, herbs, non-prescription drugs, or dietary supplements you use. Also tell them if you smoke, drink alcohol, or use illegal drugs. Some items may interact with your medicine. What should I watch for while using this medicine? Visit your doctor or health care professional for regular checks on your progress. You will need frequent blood checks during the first few months you are receiving the medicine. This medicine can make you more sensitive to the sun. Keep out of the sun. If you cannot avoid being in the sun, wear protective clothing and use sunscreen. Do not use sun lamps or tanning beds/booths. This medicine can cause birth defects. Do not get pregnant while taking this drug. Females will need to have a negative pregnancy test before starting this medicine. If sexually active, use 2 reliable forms of birth control together for 4 weeks before starting this medicine, while you are taking this medicine, and for 6 weeks after you stop taking this medicine. Birth control pills alone may not work properly while you are taking this medicine. If you think that you might be pregnant talk to your doctor right away. If you get a cold or other infection while receiving this  medicine, call your doctor or health care professional. Do not treat yourself. The medicine may decrease your body's ability to fight infections. What side effects may I notice from receiving this medicine? Side effects that you should report to your doctor or health care professional as soon as possible: -allergic reactions like skin rash, itching or hives, swelling of the face, lips, or tongue -bloody, dark, or tarry stools -changes in vision -dizziness -fever, chills or any other sign of infection -unusual bleeding or bruising -unusually weak or tired Side effects that usually do not require medical attention (report to your doctor or health care professional if they continue or are bothersome): -constipation -diarrhea -difficulty sleeping -loss of appetite -nausea, vomiting This list may not describe all possible side effects. Call your doctor for medical advice about side effects. You may report side effects to FDA at 1-800-FDA-1088. Where should I keep my medicine? Keep out of the reach of children. Store at room temperature between 15 and 30 degrees C (59 and 86 degrees F). Throw away any unused medicine after the expiration date. NOTE: This sheet is a summary. It may not cover all possible information. If you have questions about this medicine, talk to your doctor, pharmacist, or health care provider.  2018 Elsevier/Gold Standard (2007-12-14 09:25:30)    Myasthenia Gravis Myasthenia gravis (MG) means  severe weakness. It is a long-term (chronic) condition that causes weakness in the muscles you can control (voluntary muscles). MG can affect any voluntary muscle. The muscles most often affected are the ones that control:  Eye movement.  Facial movements.  Swallowing.  MG is an autoimmune disease, which means that your body's defense system (immune system) attacks healthy parts of your body instead of germs and other things that make you sick. When you have MG, your immune  system makes proteins (antibodies) that block the chemical (acetylcholine) your body needs to send nerve signals to your muscles. This causes muscle weakness. What are the causes? The exact cause of MG is unknown. One possible cause is an enlarged thymus gland, which is located under your breastbone. What are the signs or symptoms? The earliest symptom of MG is muscle weakness that gets worse with activity and gets better after rest. Other symptoms of MG may include:  Drooping eyelids.  Double vision.  Loss of facial expression.  Trouble chewing and swallowing.  Slurred speech.  A waddling walk.  Weakness of the arms, hands, and legs.  Trouble breathing is the most dangerous symptom of MG. Sudden and severe difficulty breathing (myasthenic crisis) may require emergency breathing support. This symptom sometimes happens after:  Infection.  Fever.  Drug reaction.  How is this diagnosed? It can be hard to diagnose MG because muscle weakness is a common symptom in many conditions. Your health care provider will do a physical exam. You may also have tests that will help make a diagnosis. These may include:  A blood test.  A test using the medicine edrophonium. This medicine increases muscle strength by slowing the breakdown of acetylcholine.  Tests to measure nerve conduction to muscle (electromyography).  An imaging study of the chest (CT or MRI).  How is this treated? Treatment can improve muscle strength. Sometimes symptoms of MG go away for a while (remission) and you can stop treatment. Possible treatments include:  Medicine.  Removal of the thymus gland (thymectomy). This may result in a long remission for some people.  Follow these instructions at home:  Take medicines only as directed by your health care provider.  Get plenty of rest to conserve your energy.  Take frequent breaks to rest your eyes.  Maintain a healthy diet and a healthy weight.  Do not use  any tobacco products including cigarettes, chewing tobacco, or electronic cigarettes. If you need help quitting, ask your health care provider.  Keep all follow-up visits as directed by your health care provider. This is important. Contact a health care provider if:  Your symptoms get worse after a fever or infection.  You have a reaction to a medicine you are taking.  Your symptoms change or get worse. Get help right away if: You have trouble breathing. This information is not intended to replace advice given to you by your health care provider. Make sure you discuss any questions you have with your health care provider. Document Released: 09/09/2000 Document Revised: 11/09/2015 Document Reviewed: 08/04/2013 Elsevier Interactive Patient Education  [employer].

## 2016-12-07 LAB — CBC WITH DIFFERENTIAL/PLATELET
BASOS: 0 %
Basophils Absolute: 0 10*3/uL (ref 0.0–0.2)
EOS (ABSOLUTE): 0.5 10*3/uL — ABNORMAL HIGH (ref 0.0–0.4)
Eos: 5 %
HEMOGLOBIN: 11.6 g/dL (ref 11.1–15.9)
Hematocrit: 35.3 % (ref 34.0–46.6)
IMMATURE GRANS (ABS): 0 10*3/uL (ref 0.0–0.1)
IMMATURE GRANULOCYTES: 0 %
LYMPHS: 29 %
Lymphocytes Absolute: 2.8 10*3/uL (ref 0.7–3.1)
MCH: 28.5 pg (ref 26.6–33.0)
MCHC: 32.9 g/dL (ref 31.5–35.7)
MCV: 87 fL (ref 79–97)
MONOCYTES: 10 %
Monocytes Absolute: 0.9 10*3/uL (ref 0.1–0.9)
NEUTROS PCT: 56 %
Neutrophils Absolute: 5.4 10*3/uL (ref 1.4–7.0)
Platelets: 197 10*3/uL (ref 150–379)
RBC: 4.07 x10E6/uL (ref 3.77–5.28)
RDW: 14.8 % (ref 12.3–15.4)
WBC: 9.7 10*3/uL (ref 3.4–10.8)

## 2016-12-07 LAB — COMPREHENSIVE METABOLIC PANEL
ALT: 16 IU/L (ref 0–32)
AST: 25 IU/L (ref 0–40)
Albumin/Globulin Ratio: 1.3 (ref 1.2–2.2)
Albumin: 4.1 g/dL (ref 3.5–4.8)
Alkaline Phosphatase: 69 IU/L (ref 39–117)
BUN/Creatinine Ratio: 16 (ref 12–28)
BUN: 33 mg/dL — AB (ref 8–27)
Bilirubin Total: 0.7 mg/dL (ref 0.0–1.2)
CALCIUM: 10.8 mg/dL — AB (ref 8.7–10.3)
CO2: 27 mmol/L (ref 20–29)
CREATININE: 2.01 mg/dL — AB (ref 0.57–1.00)
Chloride: 102 mmol/L (ref 96–106)
GFR, EST AFRICAN AMERICAN: 27 mL/min/{1.73_m2} — AB (ref >59)
GFR, EST NON AFRICAN AMERICAN: 23 mL/min/{1.73_m2} — AB (ref >59)
GLUCOSE: 91 mg/dL (ref 65–99)
Globulin, Total: 3.1 g/dL (ref 1.5–4.5)
Potassium: 4.6 mmol/L (ref 3.5–5.2)
Sodium: 145 mmol/L — ABNORMAL HIGH (ref 134–144)
TOTAL PROTEIN: 7.2 g/dL (ref 6.0–8.5)

## 2016-12-12 ENCOUNTER — Other Ambulatory Visit: Payer: Self-pay | Admitting: Neurology

## 2016-12-12 DIAGNOSIS — G7000 Myasthenia gravis without (acute) exacerbation: Secondary | ICD-10-CM

## 2016-12-12 MED ORDER — MYCOPHENOLATE MOFETIL 250 MG PO CAPS: 250.0000 mg | ORAL_CAPSULE | Freq: Two times a day (BID) | ORAL | 1 refills | Status: DC

## 2016-12-12 NOTE — Progress Notes (Signed)
Pt called and said that she would pick up Cellcept rx from pharmacy today. Agreed to come back next week for labs.

## 2016-12-12 NOTE — Progress Notes (Signed)
Lab orders entered x 4 wks.

## 2016-12-12 NOTE — Progress Notes (Signed)
Just cbc and cmp weekly for 4 weeks thanks!

## 2016-12-12 NOTE — Addendum Note (Signed)
Addended by: Monte Fantasia on: 12/12/2016 04:59 PM   Modules accepted: Orders

## 2016-12-16 ENCOUNTER — Telehealth: Payer: Self-pay

## 2016-12-16 NOTE — Telephone Encounter (Signed)
-----   Message from Melvenia Beam, MD sent at 12/12/2016  9:59 AM EDT ----- Please let patient know that I checked with her kidney doctor and I am going to start her on a medication for her myasthenia gravis called Cellcept. One pill twice daily and I will call her next month to see how she is doing. Also please enquire how she is doing on the prednisone thanks

## 2016-12-16 NOTE — Telephone Encounter (Signed)
Called pt who said that she is doing well on the prednisone and with starting the Cellcept. She plans on returning Fri morning for labs. Voiced appreciation for call.

## 2016-12-20 ENCOUNTER — Other Ambulatory Visit (INDEPENDENT_AMBULATORY_CARE_PROVIDER_SITE_OTHER): Payer: Self-pay

## 2016-12-20 ENCOUNTER — Telehealth: Payer: Self-pay | Admitting: Neurology

## 2016-12-20 DIAGNOSIS — G7000 Myasthenia gravis without (acute) exacerbation: Secondary | ICD-10-CM

## 2016-12-20 DIAGNOSIS — Z0289 Encounter for other administrative examinations: Secondary | ICD-10-CM

## 2016-12-20 NOTE — Telephone Encounter (Signed)
Pt came in for lab work and wanted to speak w the nurse. She waited a while but has now left the building. Best call back is [phone]

## 2016-12-20 NOTE — Telephone Encounter (Signed)
Returned call to pt. Says that she did have her labs drawn today and just wanted to review medication instructions. She is to increase Cellcept to one pill in the morning (250mg ) and 2 pills in the evening (500mg ) and have labs checked again next week. Verbalized understanding and appreciation for call.

## 2016-12-21 LAB — COMPREHENSIVE METABOLIC PANEL
A/G RATIO: 1.6 (ref 1.2–2.2)
ALT: 17 IU/L (ref 0–32)
AST: 20 IU/L (ref 0–40)
Albumin: 4.2 g/dL (ref 3.5–4.8)
Alkaline Phosphatase: 68 IU/L (ref 39–117)
BUN/Creatinine Ratio: 20 (ref 12–28)
BUN: 33 mg/dL — ABNORMAL HIGH (ref 8–27)
Bilirubin Total: 0.7 mg/dL (ref 0.0–1.2)
CALCIUM: 9.9 mg/dL (ref 8.7–10.3)
CO2: 31 mmol/L — ABNORMAL HIGH (ref 20–29)
Chloride: 100 mmol/L (ref 96–106)
Creatinine, Ser: 1.64 mg/dL — ABNORMAL HIGH (ref 0.57–1.00)
GFR calc Af Amer: 34 mL/min/{1.73_m2} — ABNORMAL LOW (ref >59)
GFR, EST NON AFRICAN AMERICAN: 30 mL/min/{1.73_m2} — AB (ref >59)
GLOBULIN, TOTAL: 2.7 g/dL (ref 1.5–4.5)
Glucose: 121 mg/dL — ABNORMAL HIGH (ref 65–99)
POTASSIUM: 4.9 mmol/L (ref 3.5–5.2)
SODIUM: 141 mmol/L (ref 134–144)
Total Protein: 6.9 g/dL (ref 6.0–8.5)

## 2016-12-21 LAB — CBC WITH DIFFERENTIAL/PLATELET
BASOS: 0 %
Basophils Absolute: 0.1 10*3/uL (ref 0.0–0.2)
EOS (ABSOLUTE): 0.3 10*3/uL (ref 0.0–0.4)
EOS: 2 %
HEMATOCRIT: 36 % (ref 34.0–46.6)
Hemoglobin: 11.9 g/dL (ref 11.1–15.9)
IMMATURE GRANULOCYTES: 1 %
Immature Grans (Abs): 0.1 10*3/uL (ref 0.0–0.1)
LYMPHS ABS: 4 10*3/uL — AB (ref 0.7–3.1)
Lymphs: 29 %
MCH: 28.1 pg (ref 26.6–33.0)
MCHC: 33.1 g/dL (ref 31.5–35.7)
MCV: 85 fL (ref 79–97)
MONOS ABS: 1.2 10*3/uL — AB (ref 0.1–0.9)
Monocytes: 8 %
NEUTROS ABS: 8.5 10*3/uL — AB (ref 1.4–7.0)
Neutrophils: 60 %
Platelets: 180 10*3/uL (ref 150–379)
RBC: 4.23 x10E6/uL (ref 3.77–5.28)
RDW: 16 % — AB (ref 12.3–15.4)
WBC: 14.1 10*3/uL — ABNORMAL HIGH (ref 3.4–10.8)

## 2016-12-25 ENCOUNTER — Telehealth: Payer: Self-pay | Admitting: *Deleted

## 2016-12-25 NOTE — Telephone Encounter (Signed)
-----   Message from Melvenia Beam, MD sent at 12/23/2016  6:52 PM EDT ----- Labs are stable thanks

## 2016-12-25 NOTE — Telephone Encounter (Signed)
Called and spoke with pt. Advised labs stable per AA,MD. She verbalized understanding.  She questioned if she still needed to come for labs this week. Advised per AA,MD and Marissa Calamity, RN. That she needs to come every week for 3 more weeks while she is titrating up on cellcept.  She verbalized understanding.  I went over instructions again with patient: Week one : one pill (250mg  twice a day Week two: One pill in the morning (250mg ) and 2 pills in the evening (500mg ) Week three: Two pills(500mg ) twice daily  Patient stated she started week two (1 pill in the morning and 2 pills in the evening this past Saturday 12/20/16. Advised she should be starting week three. Patient thought she should start week three around 7/21. Advised she should start after she finished week 2. Patient unsure about instructions.   She also wanted to know if prednisone is going to be refilled at the end of the month when she finished rx. Advised I will send her questions to Anderson Malta, RN to call and clarify. She verbalized understanding.

## 2016-12-26 NOTE — Telephone Encounter (Signed)
Pt returned call to RN Anderson Malta asking for a call back please

## 2016-12-26 NOTE — Telephone Encounter (Signed)
Called pt and left VM mssg for her to call back to further discuss med instructions and answer any questions.

## 2016-12-27 ENCOUNTER — Other Ambulatory Visit (INDEPENDENT_AMBULATORY_CARE_PROVIDER_SITE_OTHER): Payer: Self-pay

## 2016-12-27 DIAGNOSIS — G7000 Myasthenia gravis without (acute) exacerbation: Secondary | ICD-10-CM

## 2016-12-27 DIAGNOSIS — Z0289 Encounter for other administrative examinations: Secondary | ICD-10-CM

## 2016-12-27 NOTE — Telephone Encounter (Signed)
Returned pt's TC. She did come in for repeat labs this morning. Left her a mssg to increase Cellcept dose to 2 tabs (500 mg) twice per day if she has tolerated the 1 tab in am and 2 tabs in pm over the past week. Then, she is to return again next Friday for another lab draw. She may call back w/ any questions.

## 2016-12-28 LAB — COMPREHENSIVE METABOLIC PANEL
ALBUMIN: 4 g/dL (ref 3.5–4.8)
ALT: 17 IU/L (ref 0–32)
AST: 16 IU/L (ref 0–40)
Albumin/Globulin Ratio: 1.5 (ref 1.2–2.2)
Alkaline Phosphatase: 63 IU/L (ref 39–117)
BILIRUBIN TOTAL: 0.7 mg/dL (ref 0.0–1.2)
BUN / CREAT RATIO: 19 (ref 12–28)
BUN: 36 mg/dL — AB (ref 8–27)
CHLORIDE: 100 mmol/L (ref 96–106)
CO2: 27 mmol/L (ref 20–29)
Calcium: 9.7 mg/dL (ref 8.7–10.3)
Creatinine, Ser: 1.85 mg/dL — ABNORMAL HIGH (ref 0.57–1.00)
GFR calc non Af Amer: 26 mL/min/{1.73_m2} — ABNORMAL LOW (ref >59)
GFR, EST AFRICAN AMERICAN: 29 mL/min/{1.73_m2} — AB (ref >59)
GLUCOSE: 163 mg/dL — AB (ref 65–99)
Globulin, Total: 2.6 g/dL (ref 1.5–4.5)
Potassium: 5.2 mmol/L (ref 3.5–5.2)
Sodium: 140 mmol/L (ref 134–144)
TOTAL PROTEIN: 6.6 g/dL (ref 6.0–8.5)

## 2016-12-28 LAB — CBC WITH DIFFERENTIAL/PLATELET
BASOS ABS: 0 10*3/uL (ref 0.0–0.2)
BASOS: 0 %
EOS (ABSOLUTE): 0.2 10*3/uL (ref 0.0–0.4)
Eos: 2 %
HEMOGLOBIN: 11.5 g/dL (ref 11.1–15.9)
Hematocrit: 35.9 % (ref 34.0–46.6)
Immature Grans (Abs): 0 10*3/uL (ref 0.0–0.1)
Immature Granulocytes: 0 %
LYMPHS ABS: 3.7 10*3/uL — AB (ref 0.7–3.1)
Lymphs: 29 %
MCH: 27.4 pg (ref 26.6–33.0)
MCHC: 32 g/dL (ref 31.5–35.7)
MCV: 86 fL (ref 79–97)
MONOCYTES: 8 %
Monocytes Absolute: 1 10*3/uL — ABNORMAL HIGH (ref 0.1–0.9)
NEUTROS ABS: 7.9 10*3/uL — AB (ref 1.4–7.0)
Neutrophils: 61 %
Platelets: 165 10*3/uL (ref 150–379)
RBC: 4.19 x10E6/uL (ref 3.77–5.28)
RDW: 15.7 % — ABNORMAL HIGH (ref 12.3–15.4)
WBC: 12.8 10*3/uL — ABNORMAL HIGH (ref 3.4–10.8)

## 2016-12-30 ENCOUNTER — Telehealth: Payer: Self-pay

## 2016-12-30 NOTE — Telephone Encounter (Signed)
-----   Message from Melvenia Beam, MD sent at 12/28/2016 11:53 AM EDT ----- Labs stable thanks

## 2016-12-30 NOTE — Telephone Encounter (Signed)
Called pt w/ stable lab results. Verbalized understanding and appreciation for call. 

## 2017-01-02 ENCOUNTER — Other Ambulatory Visit (INDEPENDENT_AMBULATORY_CARE_PROVIDER_SITE_OTHER): Payer: Self-pay

## 2017-01-02 DIAGNOSIS — G7000 Myasthenia gravis without (acute) exacerbation: Secondary | ICD-10-CM

## 2017-01-02 DIAGNOSIS — Z0289 Encounter for other administrative examinations: Secondary | ICD-10-CM

## 2017-01-03 LAB — COMPREHENSIVE METABOLIC PANEL
A/G RATIO: 1.6 (ref 1.2–2.2)
ALBUMIN: 4 g/dL (ref 3.5–4.8)
ALK PHOS: 59 IU/L (ref 39–117)
ALT: 17 IU/L (ref 0–32)
AST: 16 IU/L (ref 0–40)
BUN / CREAT RATIO: 18 (ref 12–28)
BUN: 31 mg/dL — ABNORMAL HIGH (ref 8–27)
Bilirubin Total: 0.9 mg/dL (ref 0.0–1.2)
CO2: 24 mmol/L (ref 20–29)
CREATININE: 1.69 mg/dL — AB (ref 0.57–1.00)
Calcium: 9.8 mg/dL (ref 8.7–10.3)
Chloride: 100 mmol/L (ref 96–106)
GFR calc Af Amer: 33 mL/min/{1.73_m2} — ABNORMAL LOW (ref >59)
GFR, EST NON AFRICAN AMERICAN: 28 mL/min/{1.73_m2} — AB (ref >59)
GLOBULIN, TOTAL: 2.5 g/dL (ref 1.5–4.5)
Glucose: 163 mg/dL — ABNORMAL HIGH (ref 65–99)
Potassium: 4.9 mmol/L (ref 3.5–5.2)
SODIUM: 141 mmol/L (ref 134–144)
Total Protein: 6.5 g/dL (ref 6.0–8.5)

## 2017-01-03 LAB — CBC WITH DIFFERENTIAL/PLATELET
BASOS: 0 %
Basophils Absolute: 0 10*3/uL (ref 0.0–0.2)
EOS (ABSOLUTE): 0.3 10*3/uL (ref 0.0–0.4)
EOS: 2 %
HEMATOCRIT: 35.6 % (ref 34.0–46.6)
Hemoglobin: 11.8 g/dL (ref 11.1–15.9)
Immature Grans (Abs): 0 10*3/uL (ref 0.0–0.1)
Immature Granulocytes: 0 %
LYMPHS ABS: 3.2 10*3/uL — AB (ref 0.7–3.1)
Lymphs: 25 %
MCH: 28.3 pg (ref 26.6–33.0)
MCHC: 33.1 g/dL (ref 31.5–35.7)
MCV: 85 fL (ref 79–97)
MONOS ABS: 0.6 10*3/uL (ref 0.1–0.9)
Monocytes: 5 %
Neutrophils Absolute: 8.5 10*3/uL — ABNORMAL HIGH (ref 1.4–7.0)
Neutrophils: 68 %
Platelets: 163 10*3/uL (ref 150–379)
RBC: 4.17 x10E6/uL (ref 3.77–5.28)
RDW: 15.3 % (ref 12.3–15.4)
WBC: 12.6 10*3/uL — AB (ref 3.4–10.8)

## 2017-01-07 ENCOUNTER — Telehealth: Payer: Self-pay

## 2017-01-07 NOTE — Telephone Encounter (Signed)
Called pt w/ stable lab results. Verbalized understanding and appreciation for call. 

## 2017-01-07 NOTE — Telephone Encounter (Signed)
-----   Message from Melvenia Beam, MD sent at 01/05/2017  9:18 PM EDT ----- Labs are stable thanks

## 2017-01-10 ENCOUNTER — Other Ambulatory Visit (INDEPENDENT_AMBULATORY_CARE_PROVIDER_SITE_OTHER): Payer: Self-pay

## 2017-01-10 DIAGNOSIS — Z0289 Encounter for other administrative examinations: Secondary | ICD-10-CM

## 2017-01-10 DIAGNOSIS — G7000 Myasthenia gravis without (acute) exacerbation: Secondary | ICD-10-CM

## 2017-01-11 LAB — CBC WITH DIFFERENTIAL/PLATELET
BASOS: 0 %
Basophils Absolute: 0 10*3/uL (ref 0.0–0.2)
EOS (ABSOLUTE): 0.2 10*3/uL (ref 0.0–0.4)
EOS: 2 %
HEMATOCRIT: 34.5 % (ref 34.0–46.6)
Hemoglobin: 12 g/dL (ref 11.1–15.9)
IMMATURE GRANULOCYTES: 1 %
Immature Grans (Abs): 0.1 10*3/uL (ref 0.0–0.1)
LYMPHS: 28 %
Lymphocytes Absolute: 3.2 10*3/uL — ABNORMAL HIGH (ref 0.7–3.1)
MCH: 28.2 pg (ref 26.6–33.0)
MCHC: 34.8 g/dL (ref 31.5–35.7)
MCV: 81 fL (ref 79–97)
MONOCYTES: 6 %
Monocytes Absolute: 0.7 10*3/uL (ref 0.1–0.9)
NEUTROS ABS: 7.4 10*3/uL — AB (ref 1.4–7.0)
Neutrophils: 63 %
PLATELETS: 178 10*3/uL (ref 150–379)
RBC: 4.25 x10E6/uL (ref 3.77–5.28)
RDW: 15.9 % — AB (ref 12.3–15.4)
WBC: 11.4 10*3/uL — ABNORMAL HIGH (ref 3.4–10.8)

## 2017-01-11 LAB — COMPREHENSIVE METABOLIC PANEL
ALT: 19 IU/L (ref 0–32)
AST: 18 IU/L (ref 0–40)
Albumin/Globulin Ratio: 1.5 (ref 1.2–2.2)
Albumin: 4 g/dL (ref 3.5–4.8)
Alkaline Phosphatase: 59 IU/L (ref 39–117)
BUN/Creatinine Ratio: 17 (ref 12–28)
BUN: 28 mg/dL — AB (ref 8–27)
Bilirubin Total: 0.5 mg/dL (ref 0.0–1.2)
CALCIUM: 9.6 mg/dL (ref 8.7–10.3)
CO2: 24 mmol/L (ref 20–29)
CREATININE: 1.61 mg/dL — AB (ref 0.57–1.00)
Chloride: 98 mmol/L (ref 96–106)
GFR, EST AFRICAN AMERICAN: 35 mL/min/{1.73_m2} — AB (ref >59)
GFR, EST NON AFRICAN AMERICAN: 30 mL/min/{1.73_m2} — AB (ref >59)
GLUCOSE: 159 mg/dL — AB (ref 65–99)
Globulin, Total: 2.6 g/dL (ref 1.5–4.5)
POTASSIUM: 4.3 mmol/L (ref 3.5–5.2)
Sodium: 140 mmol/L (ref 134–144)
TOTAL PROTEIN: 6.6 g/dL (ref 6.0–8.5)

## 2017-01-14 ENCOUNTER — Telehealth: Payer: Self-pay | Admitting: Neurology

## 2017-01-14 NOTE — Telephone Encounter (Signed)
Nicole Holland, would you call patient and see if she is toelrating the cellcept ok? If so we should increase it to 500mg  twice daily. Thanks!

## 2017-01-15 ENCOUNTER — Other Ambulatory Visit: Payer: Self-pay

## 2017-01-15 DIAGNOSIS — G7000 Myasthenia gravis without (acute) exacerbation: Secondary | ICD-10-CM

## 2017-01-15 MED ORDER — MYCOPHENOLATE MOFETIL 500 MG PO TABS: 500.0000 mg | ORAL_TABLET | Freq: Two times a day (BID) | ORAL | 5 refills | Status: DC

## 2017-01-15 NOTE — Telephone Encounter (Signed)
Called and spoke with patient. Advised labs stable per AA,MD note.  She wanted to know when she should come for her next lab draw. Her last labs were drawn on 01/10/17. Advised she is now going to biweekly for 8 weeks. She is to come on 01/24/17 for her next lab draw.  She advised she has been taking cellcept 500mg  twice daily. She requested refill on medication.   I spoke with Anderson Malta, RN and she will send refill to pharmacy for pt. Verified pharmacy:  [facility] [address] [place] [phone] (Phone) [phone] (Fax)

## 2017-01-15 NOTE — Telephone Encounter (Signed)
Melvenia Beam, MD  Monte Fantasia, RN        Labs stable, thanks

## 2017-01-16 NOTE — Telephone Encounter (Addendum)
Pt called the office, the medication is not covered thru her insurance. She has been using a discount card -the cost went from $23.03 to $31.85. Please call to discuss, she can be reached at [phone]

## 2017-01-17 NOTE — Telephone Encounter (Signed)
Completed PA for refill of Cellcept 500 which was approved through 06/16/17 under Medicare Part D benefit. Notified pharmacy who agreed to contact patient w/ cost and when ready for pick-up.

## 2017-01-17 NOTE — Telephone Encounter (Signed)
New dose requires PA.

## 2017-01-24 ENCOUNTER — Other Ambulatory Visit: Payer: Self-pay

## 2017-01-24 ENCOUNTER — Other Ambulatory Visit (INDEPENDENT_AMBULATORY_CARE_PROVIDER_SITE_OTHER): Payer: Self-pay

## 2017-01-24 ENCOUNTER — Other Ambulatory Visit: Payer: Self-pay | Admitting: Neurology

## 2017-01-24 DIAGNOSIS — Z0289 Encounter for other administrative examinations: Secondary | ICD-10-CM

## 2017-01-24 DIAGNOSIS — G7000 Myasthenia gravis without (acute) exacerbation: Secondary | ICD-10-CM

## 2017-01-24 NOTE — Progress Notes (Signed)
Lab orders entered. Pt to have blood draw every other week for 8 wks as she continues Cellcept.

## 2017-01-25 LAB — CBC WITH DIFFERENTIAL/PLATELET
BASOS ABS: 0 10*3/uL (ref 0.0–0.2)
Basos: 0 %
EOS (ABSOLUTE): 0.2 10*3/uL (ref 0.0–0.4)
Eos: 2 %
Hematocrit: 36.7 % (ref 34.0–46.6)
Hemoglobin: 12.4 g/dL (ref 11.1–15.9)
IMMATURE GRANS (ABS): 0.1 10*3/uL (ref 0.0–0.1)
IMMATURE GRANULOCYTES: 1 %
Lymphocytes Absolute: 3.2 10*3/uL — ABNORMAL HIGH (ref 0.7–3.1)
Lymphs: 23 %
MCH: 28.8 pg (ref 26.6–33.0)
MCHC: 33.8 g/dL (ref 31.5–35.7)
MCV: 85 fL (ref 79–97)
Monocytes Absolute: 0.8 10*3/uL (ref 0.1–0.9)
Monocytes: 6 %
NEUTROS PCT: 68 %
Neutrophils Absolute: 9.3 10*3/uL — ABNORMAL HIGH (ref 1.4–7.0)
PLATELETS: 184 10*3/uL (ref 150–379)
RBC: 4.31 x10E6/uL (ref 3.77–5.28)
RDW: 16.1 % — ABNORMAL HIGH (ref 12.3–15.4)
WBC: 13.7 10*3/uL — AB (ref 3.4–10.8)

## 2017-01-28 ENCOUNTER — Telehealth: Payer: Self-pay

## 2017-01-28 DIAGNOSIS — G7000 Myasthenia gravis without (acute) exacerbation: Secondary | ICD-10-CM

## 2017-01-28 LAB — CMP14+EGFR
ALK PHOS: 63 IU/L
ALT: 15 IU/L
AST: 18 IU/L (ref 0–40)
Albumin/Globulin Ratio: 1.6 (ref 1.2–2.2)
Albumin: 4.1 g/dL
BUN/Creatinine Ratio: 18
BUN: 27 mg/dL
Bilirubin Total: 0.6 mg/dL (ref 0.0–1.2)
CALCIUM: 9.7 mg/dL
CO2: 21 mmol/L (ref 20–29)
CREATININE: 1.5 mg/dL
Chloride: 98 mmol/L (ref 96–106)
GLUCOSE: 180 mg/dL — AB (ref 65–99)
Globulin, Total: 2.5 g/dL (ref 1.5–4.5)
Potassium: 4.2 mmol/L (ref 3.5–5.2)
Sodium: 137 mmol/L (ref 134–144)
Total Protein: 6.6 g/dL (ref 6.0–8.5)

## 2017-01-28 LAB — SPECIMEN STATUS REPORT

## 2017-01-28 NOTE — Telephone Encounter (Signed)
OK to wean very slowly off prednisone. Go to 5 mg daily for 14 days, 2.5 mg daily for 14 days, 2.5 mg every other day for 14 days.  CD

## 2017-01-28 NOTE — Telephone Encounter (Signed)
-----   Message from Melvenia Beam, MD sent at 01/25/2017  1:38 PM EDT ----- Labs stable thanks

## 2017-01-28 NOTE — Telephone Encounter (Signed)
Called pt w/ stable lab results and instructed her to have them re-drawn again in 2 wks (02/07/17). Said that she saw her PCP who would like for her to taper off of prednisone d/t elevated blood sugar. Advised to continue Cellcept twice per day and prednisone until discussing w/ work-in doctor. She verbalized understanding and appreciation for call.

## 2017-01-29 MED ORDER — PREDNISONE 2.5 MG PO TABS: ORAL_TABLET | ORAL | 0 refills | Status: DC

## 2017-01-29 NOTE — Telephone Encounter (Signed)
Dohmeier, Asencion Partridge, MD  Monte Fantasia, RN        Very high glucose and impaired renal function trend is improving as creatinine is getting lower over the last 12 month, still impaired.

## 2017-01-29 NOTE — Telephone Encounter (Signed)
Called pt w/ instructions for gradually weaning off of prednisone. She will take 5 mg (1/2 of 10 mg tab, which she has on hand) daily for 14 days (2 wks). Then will decrease to 2.5 mg daily and 2.5 mg every other day, each for 2 weeks, before stopping. New rx e-scribed to verified pharmacy for 2.5 mg tabs with detailed instructions. Pt verbalized understanding and agreed to call back if any questions/concerns arise.

## 2017-01-29 NOTE — Addendum Note (Signed)
Addended by: Monte Fantasia on: 01/29/2017 09:28 AM   Modules accepted: Orders

## 2017-01-30 ENCOUNTER — Encounter: Payer: Self-pay | Admitting: Allergy & Immunology

## 2017-01-30 ENCOUNTER — Ambulatory Visit (INDEPENDENT_AMBULATORY_CARE_PROVIDER_SITE_OTHER): Payer: Medicare Other | Admitting: Allergy & Immunology

## 2017-01-30 VITALS — BP 128/68 | HR 72 | Resp 14

## 2017-01-30 DIAGNOSIS — J4540 Moderate persistent asthma, uncomplicated: Secondary | ICD-10-CM

## 2017-01-30 DIAGNOSIS — J3089 Other allergic rhinitis: Secondary | ICD-10-CM

## 2017-01-30 DIAGNOSIS — T781XXD Other adverse food reactions, not elsewhere classified, subsequent encounter: Secondary | ICD-10-CM

## 2017-01-30 NOTE — Progress Notes (Signed)
FOLLOW UP  Date of Service/Encounter:  01/30/17   Assessment:   Chronic nonseasonal allergic rhinitis  Moderate persistent asthma, uncomplicated  Multiple food allergies (peanut, soy, shellfish)  Lip swelling - resolved  Myasthenia gravis - on CellCept and prednisone wean   Asthma Reportables:  Severity: moderate persistent  Risk: low Control: well controlled   Plan/Recommendations:   1. Allergic rhinoconjunctivitis - Continue Nasonex 1-2 sprays per nostril daily. - Continue cetirizine 10mg  daily. - You can take an extra cetirizine if you are having worsening symptoms  2. Moderate persistent asthma - Lung testing looked great today.  - We will not make any changes at this time.  - Daily controller medication(s): Symbicort 160/4.5 two puffs twice daily with spacer - Rescue medications: ProAir 4 puffs every 4-6 hours as needed - Asthma control goals:  * Full participation in all desired activities (may need albuterol before activity) * Albuterol use two time or less a week on average (not counting use with activity) * Cough interfering with sleep two time or less a month * Oral steroids no more than once a year * No hospitalizations  3. Adverse food reactions (peanuts, soy, shellfish) - Continue to avoid these foods. - EpiPen is up to date.   4. Return in about 6 months (around 08/02/2017).   Subjective:   Nicole Holland is a 81 y.o. female presenting today for follow up of  Chief Complaint  Patient presents with  . Asthma  . Allergic Rhinitis     Nicole Holland has a history of the following: Patient Active Problem List   Diagnosis Date Noted  . Myasthenia gravis ([place]) 11/11/2016  . Moderate persistent asthma 01/24/2016  . Adverse food reaction 01/24/2016  . Allergic rhinoconjunctivitis 01/24/2016  . Chest pain 09/03/2015  . CKD (chronic kidney disease), stage IV ([place]) 09/03/2015  . Normocytic anemia 09/03/2015  . Leukocytosis 09/03/2015    . Malignant neoplasm of upper-outer quadrant of right female breast ([place]) 10/11/2011  . Hypertension 10/25/2010  . Hyperlipidemia 10/25/2010    History obtained from: chart review and patient.  Nicole Holland Primary Care Provider is Helane Rima, MD.    Nicole Holland is a delightful 81 year old who presents today for follow-up visit. She was last seen in May 2018. At that time, her a Ary is a 81 y.o. female presenting for a follow up visit. The pasthma was under good control symptomatically, but her lung function was decreased. We continued her on Symbicort 160/4.5 g 2 puffs twice daily as well as ProAir as needed. Since she was asymptomatic, we did not advance her asthma medications, instead seeing her more frequently. Her allergic rhinoconjunctivitis was under good control with Nasonex one to 2 sprays per nostril daily as well as cetirizine 10 mg daily. I recommended the use of an additional antihistamine for breakthrough symptoms. Her lip swelling was well controlled with no breakthrough episodes. She continue to avoid peanuts, soy, and shellfish.  Since the last visit, she has done well. She was diagnosed with myasthenia gravis shortly after our last appointment. She was started on CellCept, and is now on 500 mg twice a day. She was also started on a prolonged prednisone taper, and has one more week left before finishing completely. Her myasthenia gravis has improved with this regimen. She is hoping to avoid ocular surgery.  She remains on Symbicort 160/4.5 g 2 puffs twice daily. She endorses excellent compliance with no missed doses. Nicole Holland's asthma has been well controlled. She has  not required rescue medication, experienced nocturnal awakenings due to lower respiratory symptoms, nor have activities of daily living been limited. She has required no Emergency Department or Urgent Care visits for her asthma. She has required zero courses of systemic steroids for asthma exacerbations since the  last visit. ACT score today is 25, indicating excellent asthma symptom control.   Her allergic rhinitis is under good control. She cannot miss her Nasonex, otherwise her symptoms get much worse. Her symptoms are active throughout the entirety of the calendar year. She is on cetirizine 10 mg daily. She has had no more episodes of lip swelling. She avoids peanuts, soy, and shellfish. Her EpiPen is up to date.   Otherwise, there have been no changes to her past medical history, surgical history, family history, or social history. She did go to a family reunion in [place] this summer. She had a great time there. 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   Review of Systems: a 14-point review of systems is pertinent for what is mentioned in HPI.  Otherwise, all other systems were negative. Constitutional: negative other than that listed in the HPI Eyes: negative other than that listed in the HPI Ears, nose, mouth, throat, and face: negative other than that listed in the HPI Respiratory: negative other than that listed in the HPI Cardiovascular: negative other than that listed in the HPI Gastrointestinal: negative other than that listed in the HPI Genitourinary: negative other than that listed in the HPI Integument: negative other than that listed in the HPI Hematologic: negative other than that listed in the HPI Musculoskeletal: negative other than that listed in the HPI Neurological: negative other than that listed in the HPI Allergy/Immunologic: negative other than that listed in the HPI    Objective:   Blood pressure 128/68, pulse 72, resp. rate 14, SpO2 97 %. There is no height or weight on file to calculate BMI.   Physical Exam:  General: Alert, interactive, in no acute distress. Very pleasant and smiling.  Eyes: Mild drooping bilaterally, No conjunctival injection present on the right, No conjunctival injection present on  the left, PERRL bilaterally, No discharge on the right, No discharge on the left and No Horner-Trantas dots present Ears: Right TM pearly gray with normal light reflex, Left TM pearly gray with normal light reflex, Right TM intact without perforation and Left TM intact without perforation.  Nose/Throat: External nose within normal limits and septum midline, turbinates edematous with clear discharge, post-pharynx erythematous with cobblestoning in the posterior oropharynx. Tonsils 2+ without exudates Neck: Supple without thyromegaly. Lungs: Clear to auscultation without wheezing, rhonchi or rales. No increased work of breathing. CV: Normal S1/S2, no murmurs. Capillary refill <2 seconds.  Skin: Warm and dry, without lesions or rashes. Neuro:   Grossly intact. No focal deficits appreciated. Responsive to questions.   Diagnostic studies:   Spirometry: results normal (FEV1: 1.11/79%, FVC: 1.38/77%, FEV1/FVC: 80%).    Spirometry consistent with normal pattern.  Allergy Studies: none     Salvatore Marvel, MD [facility]

## 2017-01-30 NOTE — Patient Instructions (Addendum)
1. Allergic rhinoconjunctivitis - Continue Nasonex 1-2 sprays per nostril daily. - Continue cetirizine 10mg  daily. - You can take an extra cetirizine if you are having worsening symptoms  2. Moderate persistent asthma - Lung testing looked great today.  - We will not make any changes at this time.  - Daily controller medication(s): Symbicort 160/4.5 two puffs twice daily with spacer - Rescue medications: ProAir 4 puffs every 4-6 hours as needed - Asthma control goals:  * Full participation in all desired activities (may need albuterol before activity) * Albuterol use two time or less a week on average (not counting use with activity) * Cough interfering with sleep two time or less a month * Oral steroids no more than once a year * No hospitalizations  3. Adverse food reactions (peanuts, soy, shellfish) - Continue to avoid these foods. - EpiPen is up to date.   4. Return in about 6 months (around 08/02/2017).  Please inform us of any Emergency Department visits, hospitalizations, or changes in symptoms. Call us before going to the ED for breathing or allergy symptoms since we might be able to fit you in for a sick visit. Feel free to contact us anytime with any questions, problems, or concerns.  It was a pleasure to see you again today! Enjoy the rest of your summer!   Websites that have reliable patient information: 1. American Academy of Asthma, Allergy, and Immunology: www.aaaai.org 2. Food Allergy Research and Education (FARE): foodallergy.org 3. Mothers of Asthmatics: http://www.asthmacommunitynetwork.org 4. American College of Allergy, Asthma, and Immunology: www.acaai.org   Election Day is coming up on Tuesday, November 6th! Make your voice heard! Register to vote at vote.org!

## 2017-02-06 ENCOUNTER — Other Ambulatory Visit: Payer: Self-pay | Admitting: *Deleted

## 2017-02-06 ENCOUNTER — Other Ambulatory Visit (INDEPENDENT_AMBULATORY_CARE_PROVIDER_SITE_OTHER): Payer: Self-pay

## 2017-02-06 DIAGNOSIS — Z79899 Other long term (current) drug therapy: Secondary | ICD-10-CM

## 2017-02-06 DIAGNOSIS — Z0289 Encounter for other administrative examinations: Secondary | ICD-10-CM

## 2017-02-06 DIAGNOSIS — G7000 Myasthenia gravis without (acute) exacerbation: Secondary | ICD-10-CM

## 2017-02-07 LAB — CBC WITH DIFFERENTIAL/PLATELET
BASOS ABS: 0 10*3/uL (ref 0.0–0.2)
BASOS: 0 %
EOS (ABSOLUTE): 0.2 10*3/uL (ref 0.0–0.4)
Eos: 2 %
Hematocrit: 35.3 % (ref 34.0–46.6)
Hemoglobin: 11.7 g/dL (ref 11.1–15.9)
Immature Grans (Abs): 0.1 10*3/uL (ref 0.0–0.1)
Immature Granulocytes: 1 %
LYMPHS ABS: 2.8 10*3/uL (ref 0.7–3.1)
Lymphs: 25 %
MCH: 28 pg (ref 26.6–33.0)
MCHC: 33.1 g/dL (ref 31.5–35.7)
MCV: 84 fL (ref 79–97)
Monocytes Absolute: 0.9 10*3/uL (ref 0.1–0.9)
Monocytes: 8 %
NEUTROS ABS: 7 10*3/uL (ref 1.4–7.0)
Neutrophils: 64 %
PLATELETS: 191 10*3/uL (ref 150–379)
RBC: 4.18 x10E6/uL (ref 3.77–5.28)
RDW: 15.2 % (ref 12.3–15.4)
WBC: 11.1 10*3/uL — ABNORMAL HIGH (ref 3.4–10.8)

## 2017-02-07 LAB — COMPREHENSIVE METABOLIC PANEL
A/G RATIO: 1.7 (ref 1.2–2.2)
ALBUMIN: 4 g/dL (ref 3.5–4.8)
ALT: 17 IU/L (ref 0–32)
AST: 19 IU/L (ref 0–40)
Alkaline Phosphatase: 57 IU/L (ref 39–117)
BILIRUBIN TOTAL: 0.5 mg/dL (ref 0.0–1.2)
BUN / CREAT RATIO: 16 (ref 12–28)
BUN: 26 mg/dL (ref 8–27)
CHLORIDE: 100 mmol/L (ref 96–106)
CO2: 25 mmol/L (ref 20–29)
Calcium: 9.6 mg/dL (ref 8.7–10.3)
Creatinine, Ser: 1.6 mg/dL — ABNORMAL HIGH (ref 0.57–1.00)
GFR calc non Af Amer: 30 mL/min/{1.73_m2} — ABNORMAL LOW (ref >59)
GFR, EST AFRICAN AMERICAN: 35 mL/min/{1.73_m2} — AB (ref >59)
GLOBULIN, TOTAL: 2.3 g/dL (ref 1.5–4.5)
Glucose: 134 mg/dL — ABNORMAL HIGH (ref 65–99)
Potassium: 4 mmol/L (ref 3.5–5.2)
SODIUM: 140 mmol/L (ref 134–144)
TOTAL PROTEIN: 6.3 g/dL (ref 6.0–8.5)

## 2017-02-10 ENCOUNTER — Telehealth: Payer: Self-pay | Admitting: *Deleted

## 2017-02-10 NOTE — Telephone Encounter (Signed)
Spoke with patient and informed her labs are overall stable. Sugar and kidney function still abnormal (needs continued PCP follow up). WBC are borderline elevated but stable. Patient stated saw her kidney dr who told her that her labs are fine. She stated she has a FU with kidney dr in 6 months. She stated that she will be taking Prednisone 2.5 mg daily x 2 weeks beginning Wed. She then reviewed the remainder of her taper off Prednisone. She stated she is supposed to come back for labs again in two weeks, asked for that date. Advised her it is Sept 6th. She stated she has to come on a Friday, will come the Friday, the 7th. She had no further questions, verbalized understanding, appreciation  of call.

## 2017-02-21 ENCOUNTER — Other Ambulatory Visit (INDEPENDENT_AMBULATORY_CARE_PROVIDER_SITE_OTHER): Payer: Self-pay

## 2017-02-21 DIAGNOSIS — Z0289 Encounter for other administrative examinations: Secondary | ICD-10-CM

## 2017-02-21 DIAGNOSIS — G7000 Myasthenia gravis without (acute) exacerbation: Secondary | ICD-10-CM

## 2017-02-21 DIAGNOSIS — Z79899 Other long term (current) drug therapy: Secondary | ICD-10-CM

## 2017-02-22 LAB — COMPREHENSIVE METABOLIC PANEL
A/G RATIO: 1.6 (ref 1.2–2.2)
ALBUMIN: 3.9 g/dL (ref 3.5–4.8)
ALK PHOS: 61 IU/L (ref 39–117)
ALT: 17 IU/L (ref 0–32)
AST: 22 IU/L (ref 0–40)
BILIRUBIN TOTAL: 0.5 mg/dL (ref 0.0–1.2)
BUN / CREAT RATIO: 17 (ref 12–28)
BUN: 28 mg/dL — ABNORMAL HIGH (ref 8–27)
CHLORIDE: 99 mmol/L (ref 96–106)
CO2: 25 mmol/L (ref 20–29)
Calcium: 9.6 mg/dL (ref 8.7–10.3)
Creatinine, Ser: 1.61 mg/dL — ABNORMAL HIGH (ref 0.57–1.00)
GFR calc Af Amer: 35 mL/min/{1.73_m2} — ABNORMAL LOW (ref >59)
GFR calc non Af Amer: 30 mL/min/{1.73_m2} — ABNORMAL LOW (ref >59)
GLOBULIN, TOTAL: 2.4 g/dL (ref 1.5–4.5)
Glucose: 180 mg/dL — ABNORMAL HIGH (ref 65–99)
Potassium: 4 mmol/L (ref 3.5–5.2)
SODIUM: 139 mmol/L (ref 134–144)
Total Protein: 6.3 g/dL (ref 6.0–8.5)

## 2017-02-22 LAB — CBC WITH DIFFERENTIAL/PLATELET
BASOS: 0 %
Basophils Absolute: 0 10*3/uL (ref 0.0–0.2)
EOS (ABSOLUTE): 0.2 10*3/uL (ref 0.0–0.4)
Eos: 2 %
Hematocrit: 33.6 % — ABNORMAL LOW (ref 34.0–46.6)
Hemoglobin: 11.1 g/dL (ref 11.1–15.9)
Immature Grans (Abs): 0.1 10*3/uL (ref 0.0–0.1)
Immature Granulocytes: 1 %
Lymphocytes Absolute: 2.7 10*3/uL (ref 0.7–3.1)
Lymphs: 26 %
MCH: 27.3 pg (ref 26.6–33.0)
MCHC: 33 g/dL (ref 31.5–35.7)
MCV: 83 fL (ref 79–97)
MONOS ABS: 0.7 10*3/uL (ref 0.1–0.9)
Monocytes: 7 %
NEUTROS ABS: 6.5 10*3/uL (ref 1.4–7.0)
Neutrophils: 64 %
Platelets: 188 10*3/uL (ref 150–379)
RBC: 4.06 x10E6/uL (ref 3.77–5.28)
RDW: 15.4 % (ref 12.3–15.4)
WBC: 10.1 10*3/uL (ref 3.4–10.8)

## 2017-03-07 ENCOUNTER — Other Ambulatory Visit (INDEPENDENT_AMBULATORY_CARE_PROVIDER_SITE_OTHER): Payer: Self-pay

## 2017-03-07 DIAGNOSIS — Z79899 Other long term (current) drug therapy: Secondary | ICD-10-CM

## 2017-03-07 DIAGNOSIS — G7000 Myasthenia gravis without (acute) exacerbation: Secondary | ICD-10-CM

## 2017-03-07 DIAGNOSIS — Z0289 Encounter for other administrative examinations: Secondary | ICD-10-CM

## 2017-03-08 LAB — CBC WITH DIFFERENTIAL/PLATELET
BASOS: 0 %
Basophils Absolute: 0 10*3/uL (ref 0.0–0.2)
EOS (ABSOLUTE): 0.4 10*3/uL (ref 0.0–0.4)
EOS: 4 %
HEMATOCRIT: 33.4 % — AB (ref 34.0–46.6)
HEMOGLOBIN: 11.1 g/dL (ref 11.1–15.9)
Immature Grans (Abs): 0.1 10*3/uL (ref 0.0–0.1)
Immature Granulocytes: 1 %
LYMPHS ABS: 2.8 10*3/uL (ref 0.7–3.1)
Lymphs: 29 %
MCH: 27.8 pg (ref 26.6–33.0)
MCHC: 33.2 g/dL (ref 31.5–35.7)
MCV: 84 fL (ref 79–97)
MONOCYTES: 8 %
MONOS ABS: 0.8 10*3/uL (ref 0.1–0.9)
NEUTROS ABS: 5.7 10*3/uL (ref 1.4–7.0)
Neutrophils: 58 %
Platelets: 175 10*3/uL (ref 150–379)
RBC: 4 x10E6/uL (ref 3.77–5.28)
RDW: 14.8 % (ref 12.3–15.4)
WBC: 9.7 10*3/uL (ref 3.4–10.8)

## 2017-03-08 LAB — COMPREHENSIVE METABOLIC PANEL
A/G RATIO: 1.5 (ref 1.2–2.2)
ALBUMIN: 3.8 g/dL (ref 3.5–4.8)
ALK PHOS: 61 IU/L (ref 39–117)
ALT: 17 IU/L (ref 0–32)
AST: 24 IU/L (ref 0–40)
BUN / CREAT RATIO: 14 (ref 12–28)
BUN: 23 mg/dL (ref 8–27)
Bilirubin Total: 0.6 mg/dL (ref 0.0–1.2)
CO2: 23 mmol/L (ref 20–29)
CREATININE: 1.67 mg/dL — AB (ref 0.57–1.00)
Calcium: 8.9 mg/dL (ref 8.7–10.3)
Chloride: 100 mmol/L (ref 96–106)
GFR calc Af Amer: 33 mL/min/{1.73_m2} — ABNORMAL LOW (ref >59)
GFR, EST NON AFRICAN AMERICAN: 29 mL/min/{1.73_m2} — AB (ref >59)
GLOBULIN, TOTAL: 2.5 g/dL (ref 1.5–4.5)
Glucose: 144 mg/dL — ABNORMAL HIGH (ref 65–99)
POTASSIUM: 4.1 mmol/L (ref 3.5–5.2)
SODIUM: 142 mmol/L (ref 134–144)
Total Protein: 6.3 g/dL (ref 6.0–8.5)

## 2017-03-11 ENCOUNTER — Telehealth: Payer: Self-pay | Admitting: *Deleted

## 2017-03-11 NOTE — Telephone Encounter (Signed)
Spoke to pt and let her know her labs are stable.  We will see her tomorrow at 0845 for 0915 appt with Dr. [name].  She verbalized understanding.

## 2017-03-11 NOTE — Telephone Encounter (Signed)
-----   Message from Melvenia Beam, MD sent at 03/09/2017  4:34 PM EDT ----- Labs look stable, I will see her in a few days for appointment thanks

## 2017-03-12 ENCOUNTER — Encounter: Payer: Self-pay | Admitting: Neurology

## 2017-03-12 ENCOUNTER — Ambulatory Visit: Payer: Medicare Other | Admitting: Neurology

## 2017-03-12 VITALS — BP 150/78 | HR 75 | Ht 59.0 in | Wt 196.6 lb

## 2017-03-12 DIAGNOSIS — G7000 Myasthenia gravis without (acute) exacerbation: Secondary | ICD-10-CM

## 2017-03-12 MED ORDER — MYCOPHENOLATE MOFETIL 500 MG PO TABS: 1000.0000 mg | ORAL_TABLET | Freq: Two times a day (BID) | ORAL | 11 refills | Status: DC

## 2017-03-12 NOTE — Patient Instructions (Addendum)
Labs monthly until you see me again Follow up in 6 months Take 2 pills CellCept in the morning and one pill at night for 1 week and then increase to 2 pills twice a day.

## 2017-03-12 NOTE — Progress Notes (Signed)
[facility]    Provider:  Dr [name] Referring Provider: Helane Rima, MD Primary Care Physician:  Helane Rima, MD  CC:  Myasthenia Gravis  Interval 03/12/2017:  Nicole Holland is a 81 y.o. female here as a referral from Dr. Lavone Neri for Myasthenia Gravis.ACHR binding antibody serum was positive. No double vision. Feels improved. She has finished her prednisone.  Will increase CellCept 1000 mg twice a day which is the preferred dose. Would prefer patient to come back in 3 months however she declines, so she does not want to come back for 6 months. We will do labs monthly until she sees me again. Discussed side effects again and asked her to please call our office if there is any side effects whatsoever.  Interval History 12/09/2016:  Nicole [place] is a 81 y.o. female here as a referral from Dr. Lavone Neri for Myasthenia Gravis.ACHR binding antibody serum was positive.  I had a long talk with her at last appointment however I don't the patient understands there may be memory problems. Asked her son to come along with her that she is here alone again today. I asked her to explain to me what is going on and she said "I really don't know what is going on". She had a long appointment with the ophthalmologist to discuss MG and also I spoke to her about MG but appears she has memory problems and a vague understanding.   [facility]  OVZ:CHYIF [name] a 81 y.o.femalehere as a referral from Dr. Dorna Bloom myogenic ptosis. She noticed eyelid drooping for a year. The eye symptoms don;t change with time of day, not better in the morning or evening. Pollen makes it worse, night driving is hard. No significant improvement after a nap. The left eye is worse. It is stable. People think she is asleep when she is not actually asleep. No double vision. No difficulty swallowing, breathing, no voice changing. Pollen season has been bad. No voice  changes. Her legs try to give out on her but that's been "going on forever" no new muscle or new difficulties lifting arms overhead or climbing stairs. She can comb and style hair, brush teeth, can get out of seats. No neck weakness.   Reviewed notes, labs and imaging from outside physicians, which showed:   Drooping of the right eye and left eye. Patient's [facility]. Eyelids have been drooping for the past year and getting worse, causing I was to feel heavy and rubs slightly as the eyes. She is not able to see as clearly as she used to. She feels is progressively getting worse. Eyes have been tearing and burning which makes vision blurry sometimes. She can't see as clear as she would like to due to her eyelids drooping down in her eyes very bothersome. In driving at night has become a problem. Reviewed exam which showed a 20/60 in 2040 distance 20/50 in 20/30 near, patient was not dilated, intraocular pressure is normal, pupils equally round and reactive to light no APD, open angles, extraocular movements intact, myogenic ptosis, anterior chamber is normal, adnexa normal. They were referred here for EMG. Myasthenia panel was sent and a ACHR binding antibody serum was positive.   Reviewed labs: Receptor binding antibodies were elevated at 0.29 otherwise blocking antibodies and modulating antibodies were negative.  Review of Systems: Patient complains of symptoms per HPI as well as the following symptoms: no SOB, no CP. Pertinent negatives per HPI. All others negative.  Social History   Social History  . Marital status: Legally Separated    Spouse name: N/A  . Number of children: 4  . Years of education: 12   Occupational History  .      NA   Social History Main Topics  . Smoking status: Never Smoker  . Smokeless tobacco: Never Used  . Alcohol use No  . Drug use: No  . Sexual activity: No     Comment: mensus age 59,G5P4, 47st preg age 81, b.c short time,couldn't  tolerate   Other Topics Concern  . Not on file   Social History Narrative   Lives alone   Caffeine- none    Family History  Problem Relation Age of Onset  . Stroke Mother   . Diabetes Mother   . Cancer Mother   . Cancer Father        Lung cancer  . Diabetes Sister   . Cancer Sister        Unknown Cancer  . Diabetes Brother   . Cancer Brother        Unknown cancer  . Diabetes Brother   . Cancer Brother        Unknown cancer  . Cancer Brother        Unknown Cancer  . Diabetes Brother   . Cancer Brother        Unknown Cancer  . Autoimmune disease Neg Hx     Past Medical History:  Diagnosis Date  . Arthritis   . Asthma   . Breast cancer ([place]) 08/02/11   right low grade ductal ca in situ upper outer quad   . Gout   . Hyperlipidemia   . Hypertension   . S/P radiation therapy 10/28/11 - 12/02/11   Right Breast/ 5000cGy/ 25 Fractions  . Use of tamoxifen (Nolvadex) 12/04/11   Dr. Truddie Coco  . Vitamin D deficiency     Past Surgical History:  Procedure Laterality Date  . ABDOMINAL HYSTERECTOMY  1994   partial ovaries intact  . BREAST LUMPECTOMY  08/02/11   Lumpectomy right Breast; Low Grade Ductal Carcinoma In Situ with Associated Calcifications, ER 65%, PR 99%  . BREAST SURGERY  02/01/2000   right breast subareolar core biopsy  . BREAST SURGERY  09/27/11   Right Breast; Excision of  Anterior Margin- Benign Breast Parenchyma, Negative for In situ and Invasive Cancer, Surgical Margins Negative, Benign skin, Negative for Malignancy  . CATARACT EXTRACTION, BILATERAL  2015, 2016  . CESAREAN SECTION    . TONSILLECTOMY      Current Outpatient Prescriptions  Medication Sig Dispense Refill  . acetaminophen (TYLENOL) 650 MG CR tablet Take 1,300 mg by mouth every 8 (eight) hours as needed for pain.    Marland Kitchen albuterol (PROAIR HFA) 108 (90 BASE) MCG/ACT inhaler Inhale 2 puffs into the lungs every 6 (six) hours as needed for wheezing or shortness of breath.     Marland Kitchen atorvastatin (LIPITOR)  20 MG tablet Take 20 mg by mouth at bedtime.  0  . budesonide-formoterol (SYMBICORT) 160-4.5 MCG/ACT inhaler Inhale 2 puffs into the lungs as needed.     . carvedilol (COREG) 6.25 MG tablet Take 12.5 mg by mouth 2 (two) times daily.  0  . diphenhydrAMINE (BENADRYL) 25 mg capsule Take by mouth.    . EPINEPHrine (EPIPEN 2-PAK) 0.3 mg/0.3 mL IJ SOAJ injection     . furosemide (LASIX) 40 MG tablet Take 40 mg by mouth daily.  0  . glucose blood (ONE  TOUCH ULTRA TEST) test strip TEST once daily    . guaiFENesin (MUCINEX) 600 MG 12 hr tablet Take 600 mg by mouth as needed.     . latanoprost (XALATAN) 0.005 % ophthalmic solution Apply to eye.    . losartan (COZAAR) 25 MG tablet Take 25 mg by mouth daily.  0  . mometasone (NASONEX) 50 MCG/ACT nasal spray Place 2 sprays into the nose daily.      . Multiple Vitamins-Minerals (CENTRUM SILVER PO) Take 1 tablet by mouth daily.     . mycophenolate (CELLCEPT) 500 MG tablet Take 1 tablet (500 mg total) by mouth 2 (two) times daily. 60 tablet 5  . olopatadine (PATANOL) 0.1 % ophthalmic solution instill 1 drop into both eyes daily  0  . Olopatadine HCl (PATADAY) 0.2 % SOLN Apply 1 drop to eye as needed (itching).     . Omega-3 Fatty Acids (FISH OIL) 1200 MG CAPS Take 1,200 mg by mouth 2 (two) times daily.     Glory Rosebush DELICA LANCETS 16X MISC TEST ONCE DAILY AS DIRECTED    . Polyethylene Glycol 400 (BLINK TEARS) 0.25 % SOLN Apply 1 drop to eye as needed (dry eyes).     . vitamin C (ASCORBIC ACID) 500 MG tablet Take 500 mg by mouth daily.       No current facility-administered medications for this visit.     Allergies as of 03/12/2017 - Review Complete 03/12/2017  Allergen Reaction Noted  . Iodides Itching 10/25/2010  . Peanut oil Other (See Comments) 01/04/2016  . Shellfish allergy Other (See Comments) 01/04/2016  . Soybean-containing drug products Other (See Comments) 01/04/2016  . Vaccinium angustifolium Other (See Comments) 01/04/2016  . Mestinon  [pyridostigmine bromide] Diarrhea 12/06/2016  . Penicillins Itching 10/25/2010    Vitals: BP (!) 150/78   Pulse 75   Ht 4\' 11"  (1.499 m)   Wt 196 lb 9.6 oz (89.2 kg)   BMI 39.71 kg/m  Last Weight:  Wt Readings from Last 1 Encounters:  03/12/17 196 lb 9.6 oz (89.2 kg)   Last Height:   Ht Readings from Last 1 Encounters:  03/12/17 4\' 11"  (1.499 m)     Exam: Gen: NAD, conversant, well nourised, obese, well groomed  CV: RRR, +SEM. No Carotid Bruits. No peripheral edema, warm, nontender Eyes: Conjunctivae clear without exudates or hemorrhage  Neuro: Detailed Neurologic Exam  Speech: Speech is normal; fluent and spontaneous with normal comprehension.  Cognition: The patient is oriented to person, place, and time;  recent and remote memory intact;  language fluent;  normal attention, concentration,  fund of knowledge Cranial Nerves: The pupils are equal, round, and reactive to light. Attempted fundoscopic exam could not visualize. Visual fields are full to finger confrontation. Impaired left upgaze with fatiguability. Trigeminal sensation is intact and the muscles of mastication are normal. Left> right ptosis. Left to mid pupil right just above pupil.The palate elevates in the midline. Hearing intact. Voice is normal. Shoulder shrug is normal. The tongue has normal motion without fasciculations.   Coordination: No dysmetria  Gait: Can get on heels and toes, gait mild waddling  Motor Observation: can get out of chair without using hands. no involuntary movements noted. Tone: Normal muscle tone.   Posture: Posture is normal. normal erect  Strength: Bilateral biceps 3+/5 Right triceps 4+ left triceps4 bilat hip flexion 4+/5 Otherwise intact   Sensation: intact to LT  Reflex Exam:  DTR's: Deep tendon reflexes in the upper and lower extremities are brisk  bilaterally.  Toes: The toes are  downgoing bilaterally.  Clonus: Clonus is absent.  +ice pack test      Assessment/Plan:  81 year old with seropositive myasthenia gravis, acetylcholine receptor binding antibody positive   - Will hold off on emg/ncs, don't think we need an emg/ncs to diagnose myasthenia gravis in this case -  Did not tolerate Mestinon 30mg  3x a day - CT of the chest to eval for thymoma: Negative - Will increase CellCept 1000 mg twice daily. Would like to see patient back in 2-3 months however she declined so she can only come back in 6 months. Discussed side effects again, call for any side effects at all. We'll continue lab testing monthly until she sees me. She has stopped prednisone.  Take 2 pills CellCept in the morning and one pill at night for 1 week and then increase to 2 pills twice a day.  Needs labs  Monthly for 4 months and then q75months  Will increase Cellcept, discussed side effects of patient.. Likely takes at least 6 months and perhaps longer to produce significant benefit. There is evidence that mycophenolate has a more rapid onset of clinical effect.The medicine comes in 250 mg and 500 mg tablets. The standard dose is 1000 mg twice a day. Some people need a higher dose of 1500 mg twice a day. The primary side effect that you might notice is gastrointestinal upset. To help overcome this, you should begin the medicine at one 250 mg tablet twice a day for the first five days and increase by adding one tablet morning and evening every five days until you get to the total dose of four tablets twice a day. Labs weekly for 4 weeks, then biweekly for 4 weeks and monthly afterwards. Discussed all side effects as per patient instructions and above including risk of rare brain disease calledPML.  Again, Discussed that shouldshe have acute worsening especially shortness of breath patient should call 911 and be transported to the emergency room due to risks for myasthenia gravis  exacerbation and respiratory failure.   Sarina Ill, MD  [facility] [address] [place], [place] [address]  Phone [phone] Fax [phone]  A total of 25 minutes was spent face-to-face with this patient. Over half this time was spent on counseling patient on the myasthenia gravis diagnosis and different diagnostic and therapeutic options available.

## 2017-03-18 ENCOUNTER — Telehealth: Payer: Self-pay | Admitting: Neurology

## 2017-03-18 NOTE — Telephone Encounter (Signed)
I called pt, I reiterated with pt Dr Cathren Laine instructions of "Take 2 pills CellCept in the morning and one pill at night for 1 week and then increase to 2 pills twice a day." Pt reports that she has this information written down on her AVS but she just wanted to make sure, since her pill bottle says 2 tablets BID. Pt will let us know of any side effects. Pt verbalized understanding.

## 2017-03-18 NOTE — Telephone Encounter (Signed)
Pt is asking for a call back with the directions as to how to take the mycophenolate (CELLCEPT) 500 MG tablet

## 2017-04-10 ENCOUNTER — Other Ambulatory Visit (INDEPENDENT_AMBULATORY_CARE_PROVIDER_SITE_OTHER): Payer: Self-pay

## 2017-04-10 DIAGNOSIS — G7000 Myasthenia gravis without (acute) exacerbation: Secondary | ICD-10-CM

## 2017-04-10 DIAGNOSIS — Z0289 Encounter for other administrative examinations: Secondary | ICD-10-CM

## 2017-04-10 DIAGNOSIS — Z79899 Other long term (current) drug therapy: Secondary | ICD-10-CM

## 2017-04-11 LAB — COMPREHENSIVE METABOLIC PANEL
ALK PHOS: 69 IU/L (ref 39–117)
ALT: 11 IU/L (ref 0–32)
AST: 18 IU/L (ref 0–40)
Albumin/Globulin Ratio: 1.7 (ref 1.2–2.2)
Albumin: 4.2 g/dL (ref 3.5–4.8)
BUN/Creatinine Ratio: 22 (ref 12–28)
BUN: 34 mg/dL — AB (ref 8–27)
Bilirubin Total: 0.5 mg/dL (ref 0.0–1.2)
CALCIUM: 9.7 mg/dL (ref 8.7–10.3)
CO2: 22 mmol/L (ref 20–29)
CREATININE: 1.56 mg/dL — AB (ref 0.57–1.00)
Chloride: 103 mmol/L (ref 96–106)
GFR calc Af Amer: 36 mL/min/{1.73_m2} — ABNORMAL LOW (ref >59)
GFR, EST NON AFRICAN AMERICAN: 31 mL/min/{1.73_m2} — AB (ref >59)
GLUCOSE: 145 mg/dL — AB (ref 65–99)
Globulin, Total: 2.5 g/dL (ref 1.5–4.5)
Potassium: 4.1 mmol/L (ref 3.5–5.2)
Sodium: 143 mmol/L (ref 134–144)
Total Protein: 6.7 g/dL (ref 6.0–8.5)

## 2017-04-14 ENCOUNTER — Telehealth: Payer: Self-pay | Admitting: *Deleted

## 2017-04-14 NOTE — Telephone Encounter (Signed)
-----   Message from Melvenia Beam, MD sent at 04/11/2017  9:48 PM EDT ----- Labs stable

## 2017-04-14 NOTE — Telephone Encounter (Signed)
Called and spoke with pt. She is aware her labs are stable and had no further questions.

## 2017-05-13 ENCOUNTER — Other Ambulatory Visit (INDEPENDENT_AMBULATORY_CARE_PROVIDER_SITE_OTHER): Payer: Medicare Other

## 2017-05-13 DIAGNOSIS — Z0289 Encounter for other administrative examinations: Secondary | ICD-10-CM

## 2017-05-13 DIAGNOSIS — Z79899 Other long term (current) drug therapy: Secondary | ICD-10-CM

## 2017-05-13 DIAGNOSIS — G7000 Myasthenia gravis without (acute) exacerbation: Secondary | ICD-10-CM

## 2017-05-14 LAB — COMPREHENSIVE METABOLIC PANEL
ALBUMIN: 4 g/dL (ref 3.5–4.8)
ALT: 10 IU/L (ref 0–32)
AST: 18 IU/L (ref 0–40)
Albumin/Globulin Ratio: 1.7 (ref 1.2–2.2)
Alkaline Phosphatase: 66 IU/L (ref 39–117)
BUN/Creatinine Ratio: 16 (ref 12–28)
BUN: 30 mg/dL — ABNORMAL HIGH (ref 8–27)
Bilirubin Total: 0.6 mg/dL (ref 0.0–1.2)
CALCIUM: 9.8 mg/dL (ref 8.7–10.3)
CHLORIDE: 102 mmol/L (ref 96–106)
CO2: 24 mmol/L (ref 20–29)
Creatinine, Ser: 1.82 mg/dL — ABNORMAL HIGH (ref 0.57–1.00)
GFR calc Af Amer: 30 mL/min/{1.73_m2} — ABNORMAL LOW (ref >59)
GFR, EST NON AFRICAN AMERICAN: 26 mL/min/{1.73_m2} — AB (ref >59)
Globulin, Total: 2.3 g/dL (ref 1.5–4.5)
Glucose: 86 mg/dL (ref 65–99)
POTASSIUM: 4 mmol/L (ref 3.5–5.2)
Sodium: 142 mmol/L (ref 134–144)
TOTAL PROTEIN: 6.3 g/dL (ref 6.0–8.5)

## 2017-05-15 ENCOUNTER — Telehealth: Payer: Self-pay | Admitting: *Deleted

## 2017-05-15 DIAGNOSIS — G7000 Myasthenia gravis without (acute) exacerbation: Secondary | ICD-10-CM

## 2017-05-15 NOTE — Telephone Encounter (Addendum)
Called patient, she verbalized understanding of her lab results and said she's been hydrating herself well with water, but hasn't had much of an appetite. She stated she has an appointment w/ her PCP on 12/7. I advised her to discuss this at her appt. She will come back in 2 weeks around 12/13 for repeat lab work. She had no other concerns.    ----- Message from Melvenia Beam, MD sent at 05/15/2017 11:12 AM EST ----- Creatinine slightly elevated, she should come back in 2 weeks for repeat. Make sure she is hydrating well thanks

## 2017-05-16 NOTE — Telephone Encounter (Signed)
cmp and cbc thanks!

## 2017-05-19 NOTE — Telephone Encounter (Addendum)
CMP & CBC ordered for approximated draw date of 05/29/17.

## 2017-05-19 NOTE — Addendum Note (Signed)
Addended by: Gildardo Griffes on: 05/19/2017 09:18 AM   Modules accepted: Orders

## 2017-05-29 ENCOUNTER — Other Ambulatory Visit (INDEPENDENT_AMBULATORY_CARE_PROVIDER_SITE_OTHER): Payer: Self-pay

## 2017-05-29 DIAGNOSIS — Z0289 Encounter for other administrative examinations: Secondary | ICD-10-CM

## 2017-05-29 DIAGNOSIS — G7000 Myasthenia gravis without (acute) exacerbation: Secondary | ICD-10-CM

## 2017-05-30 ENCOUNTER — Telehealth: Payer: Self-pay | Admitting: *Deleted

## 2017-05-30 LAB — COMPREHENSIVE METABOLIC PANEL
ALT: 11 IU/L (ref 0–32)
AST: 18 IU/L (ref 0–40)
Albumin/Globulin Ratio: 1.7 (ref 1.2–2.2)
Albumin: 4.3 g/dL (ref 3.5–4.8)
Alkaline Phosphatase: 76 IU/L (ref 39–117)
BUN/Creatinine Ratio: 15 (ref 12–28)
BUN: 25 mg/dL (ref 8–27)
Bilirubin Total: 0.6 mg/dL (ref 0.0–1.2)
CALCIUM: 9.9 mg/dL (ref 8.7–10.3)
CO2: 26 mmol/L (ref 20–29)
Chloride: 103 mmol/L (ref 96–106)
Creatinine, Ser: 1.63 mg/dL — ABNORMAL HIGH (ref 0.57–1.00)
GFR, EST AFRICAN AMERICAN: 34 mL/min/{1.73_m2} — AB (ref >59)
GFR, EST NON AFRICAN AMERICAN: 30 mL/min/{1.73_m2} — AB (ref >59)
GLUCOSE: 124 mg/dL — AB (ref 65–99)
Globulin, Total: 2.5 g/dL (ref 1.5–4.5)
POTASSIUM: 4.1 mmol/L (ref 3.5–5.2)
Sodium: 144 mmol/L (ref 134–144)
TOTAL PROTEIN: 6.8 g/dL (ref 6.0–8.5)

## 2017-05-30 LAB — CBC
Hematocrit: 33.4 % — ABNORMAL LOW (ref 34.0–46.6)
Hemoglobin: 11 g/dL — ABNORMAL LOW (ref 11.1–15.9)
MCH: 27.6 pg (ref 26.6–33.0)
MCHC: 32.9 g/dL (ref 31.5–35.7)
MCV: 84 fL (ref 79–97)
PLATELETS: 211 10*3/uL (ref 150–379)
RBC: 3.98 x10E6/uL (ref 3.77–5.28)
RDW: 14.7 % (ref 12.3–15.4)
WBC: 9.2 10*3/uL (ref 3.4–10.8)

## 2017-05-30 NOTE — Telephone Encounter (Addendum)
Called and spoke with patient. Discussed lab results (included below). She verbalized understanding and appreciation for the call.    ----- Message from Melvenia Beam, MD sent at 05/30/2017  7:27 AM EST ----- Creatinine improved, looks good. thanks

## 2017-06-17 DIAGNOSIS — J329 Chronic sinusitis, unspecified: Secondary | ICD-10-CM

## 2017-06-17 DIAGNOSIS — A0472 Enterocolitis due to Clostridium difficile, not specified as recurrent: Secondary | ICD-10-CM

## 2017-06-17 HISTORY — DX: Enterocolitis due to Clostridium difficile, not specified as recurrent: A04.72

## 2017-06-17 HISTORY — DX: Chronic sinusitis, unspecified: J32.9

## 2017-07-11 ENCOUNTER — Other Ambulatory Visit (INDEPENDENT_AMBULATORY_CARE_PROVIDER_SITE_OTHER): Payer: Self-pay

## 2017-07-11 ENCOUNTER — Telehealth: Payer: Self-pay | Admitting: *Deleted

## 2017-07-11 DIAGNOSIS — Z0289 Encounter for other administrative examinations: Secondary | ICD-10-CM

## 2017-07-11 DIAGNOSIS — G7000 Myasthenia gravis without (acute) exacerbation: Secondary | ICD-10-CM

## 2017-07-11 NOTE — Addendum Note (Signed)
Addended by: Gildardo Griffes on: 07/11/2017 11:27 AM   Modules accepted: Orders

## 2017-07-11 NOTE — Telephone Encounter (Signed)
Patient here to get her labs drawn.   Confirmed with Dr. [name], need CMP & CBC. Labs ordered.

## 2017-07-12 LAB — CBC WITH DIFFERENTIAL/PLATELET
BASOS ABS: 0 10*3/uL (ref 0.0–0.2)
Basos: 0 %
EOS (ABSOLUTE): 0.3 10*3/uL (ref 0.0–0.4)
EOS: 3 %
HEMATOCRIT: 30.4 % — AB (ref 34.0–46.6)
HEMOGLOBIN: 10 g/dL — AB (ref 11.1–15.9)
IMMATURE GRANS (ABS): 0 10*3/uL (ref 0.0–0.1)
Immature Granulocytes: 0 %
LYMPHS: 31 %
Lymphocytes Absolute: 2.7 10*3/uL (ref 0.7–3.1)
MCH: 26.5 pg — ABNORMAL LOW (ref 26.6–33.0)
MCHC: 32.9 g/dL (ref 31.5–35.7)
MCV: 80 fL (ref 79–97)
Monocytes Absolute: 0.6 10*3/uL (ref 0.1–0.9)
Monocytes: 7 %
Neutrophils Absolute: 5 10*3/uL (ref 1.4–7.0)
Neutrophils: 59 %
Platelets: 208 10*3/uL (ref 150–379)
RBC: 3.78 x10E6/uL (ref 3.77–5.28)
RDW: 14.4 % (ref 12.3–15.4)
WBC: 8.6 10*3/uL (ref 3.4–10.8)

## 2017-07-12 LAB — COMPREHENSIVE METABOLIC PANEL
ALBUMIN: 4.2 g/dL (ref 3.5–4.8)
ALK PHOS: 68 IU/L (ref 39–117)
ALT: 11 IU/L (ref 0–32)
AST: 18 IU/L (ref 0–40)
Albumin/Globulin Ratio: 1.8 (ref 1.2–2.2)
BILIRUBIN TOTAL: 0.6 mg/dL (ref 0.0–1.2)
BUN / CREAT RATIO: 17 (ref 12–28)
BUN: 29 mg/dL — ABNORMAL HIGH (ref 8–27)
CHLORIDE: 102 mmol/L (ref 96–106)
CO2: 26 mmol/L (ref 20–29)
CREATININE: 1.71 mg/dL — AB (ref 0.57–1.00)
Calcium: 10.1 mg/dL (ref 8.7–10.3)
GFR calc non Af Amer: 28 mL/min/{1.73_m2} — ABNORMAL LOW (ref >59)
GFR, EST AFRICAN AMERICAN: 32 mL/min/{1.73_m2} — AB (ref >59)
Globulin, Total: 2.3 g/dL (ref 1.5–4.5)
Glucose: 108 mg/dL — ABNORMAL HIGH (ref 65–99)
Potassium: 3.9 mmol/L (ref 3.5–5.2)
Sodium: 144 mmol/L (ref 134–144)
TOTAL PROTEIN: 6.5 g/dL (ref 6.0–8.5)

## 2017-07-15 ENCOUNTER — Telehealth: Payer: Self-pay | Admitting: *Deleted

## 2017-07-15 NOTE — Telephone Encounter (Signed)
Called and spoke with patient. She is aware that labs are stable. She verbalized appreciation.

## 2017-07-15 NOTE — Telephone Encounter (Signed)
-----   Message from Melvenia Beam, MD sent at 07/15/2017 10:40 AM EST ----- Labs stable thanks

## 2017-07-24 ENCOUNTER — Encounter: Payer: Self-pay | Admitting: Allergy & Immunology

## 2017-07-24 ENCOUNTER — Ambulatory Visit: Payer: Medicare Other | Admitting: Allergy & Immunology

## 2017-07-24 VITALS — BP 122/82 | HR 81 | Ht <= 58 in | Wt 184.2 lb

## 2017-07-24 DIAGNOSIS — J4540 Moderate persistent asthma, uncomplicated: Secondary | ICD-10-CM | POA: Diagnosis not present

## 2017-07-24 MED ORDER — BUDESONIDE-FORMOTEROL FUMARATE 160-4.5 MCG/ACT IN AERO: 2.0000 | INHALATION_SPRAY | RESPIRATORY_TRACT | 5 refills | Status: DC | PRN

## 2017-07-24 MED ORDER — ALBUTEROL SULFATE HFA 108 (90 BASE) MCG/ACT IN AERS: 2.0000 | INHALATION_SPRAY | Freq: Four times a day (QID) | RESPIRATORY_TRACT | 1 refills | Status: DC | PRN

## 2017-07-24 MED ORDER — MOMETASONE FUROATE 50 MCG/ACT NA SUSP: 2.0000 | Freq: Every day | NASAL | 5 refills | Status: DC

## 2017-07-24 NOTE — Progress Notes (Signed)
FOLLOW UP  Date of Service/Encounter:  07/24/17   Assessment:   Chronic nonseasonal allergic rhinitis  Moderate persistent asthma, uncomplicated  Multiple food allergies (peanut, soy, shellfish)  Lip swelling - resolved  Myasthenia gravis - on CellCept and prednisone wean   Asthma Reportables:  Severity: moderate persistent  Risk: low Control: well controlled  Plan/Recommendations:   1. Allergic rhinoconjunctivitis - Continue Nasonex 1-2 sprays per nostril daily. - Continue cetirizine 10mg  daily.  2. Moderate persistent asthma - Lung testing looked great today.  - There is no need for any changes at this time.  - Daily controller medication(s): Symbicort 160/4.5 two puffs twice daily with spacer - Rescue medications: ProAir 4 puffs every 4-6 hours as needed - Asthma control goals:  * Full participation in all desired activities (may need albuterol before activity) * Albuterol use two time or less a week on average (not counting use with activity) * Cough interfering with sleep two time or less a month * Oral steroids no more than once a year * No hospitalizations  3. Adverse food reactions (peanuts, soy, shellfish) - Continue to avoid these foods. - EpiPen is up to date.   4. Return in about 6 months (around 01/21/2018).  Subjective:   Nicole Holland is a 82 y.o. female presenting today for follow up of  Chief Complaint  Patient presents with  . Follow-up    Pt presents for follow up of asthma. Pt states her asthma has been "great".    Nicole Holland has a history of the following: Patient Active Problem List   Diagnosis Date Noted  . Chronic nonseasonal allergic rhinitis due to pollen 01/30/2017  . Moderate persistent asthma, uncomplicated 34/74/2595  . Myasthenia gravis ([place]) 11/11/2016  . Moderate persistent asthma 01/24/2016  . Adverse food reaction 01/24/2016  . Allergic rhinoconjunctivitis 01/24/2016  . Chest pain 09/03/2015  . CKD  (chronic kidney disease), stage IV ([place]) 09/03/2015  . Normocytic anemia 09/03/2015  . Leukocytosis 09/03/2015  . Malignant neoplasm of upper-outer quadrant of right female breast ([place]) 10/11/2011  . Hypertension 10/25/2010  . Hyperlipidemia 10/25/2010    History obtained from: chart review and patient.  [facility] Primary Care Provider is Helane Rima, MD.     [name] is a 82 y.o. female presenting for a follow up visit. She was last seen in August 2018. At that time, she was well controlled. We continued her on Symbicort 160/4.5 two puffs BID as well as ProAir as needed. Her allergic rhinitis was controlled with Nasonex 1-2 sprays per nostril daily as well as cetirizine 10mg  daily. She continued to avoid peanuts, soy, and shellfish.   Since the last visit, she has done very well. She turned 82 years old yesterday and her family took her out to [facility]. They did not have the waiting staff sing to her, however, which she was very thankful for. She is decked out with a purple suit today and is quite feisty.   From an asthma perspective, she has done well. She remains on Symbicort 160/4.5 two puffs BID. She thinks that this is providing excellent control of her asthma symptoms. Aidah's asthma has been well controlled. She has not required rescue medication, experienced nocturnal awakenings due to lower respiratory symptoms, nor have activities of daily living been limited. She has required no Emergency Department or Urgent Care visits for her asthma. She has required zero courses of systemic steroids for asthma exacerbations since the last visit. ACT score  today is 25, indicating excellent asthma symptom control.   Allergic rhinitis is well controlled with Nasonex daily. She does not use her cetirizine on a daily basis at all. She has remained well controlled on this regimen. She avoids soy, peanuts, and shellfish. She has had no accidental ingestions. EpiPen is up to date, as  far as Nicole Holland knows.   She remains on CellCept 500mg  BID for her myasthenia gravis. The prednisone was messing up her blood sugars so this was stopped.   Otherwise, there have been no changes to her past medical history, surgical history, family history, or social history. Unfortunately, her senior center close to her home has closed and she is now having to travel to [facility]. She has not been going to her bingo as often now since it is so far away from her home. She drives her own car and does not like to drive too early in the morning or late at night. She continues to remains physically active but has been getting most of her walking in at the grocery store during the colder months. She does go to the [facility] during the warmer months.     Review of Systems: a 14-point review of systems is pertinent for what is mentioned in HPI.  Otherwise, all other systems were negative. Constitutional: negative other than that listed in the HPI Eyes: negative other than that listed in the HPI Ears, nose, mouth, throat, and face: negative other than that listed in the HPI Respiratory: negative other than that listed in the HPI Cardiovascular: negative other than that listed in the HPI Gastrointestinal: negative other than that listed in the HPI Genitourinary: negative other than that listed in the HPI Integument: negative other than that listed in the HPI Hematologic: negative other than that listed in the HPI Musculoskeletal: negative other than that listed in the HPI Neurological: negative other than that listed in the HPI Allergy/Immunologic: negative other than that listed in the HPI    Objective:   Blood pressure 122/82, pulse 81, height 4\' 10"  (1.473 m), weight 184 lb 3.2 oz (83.6 kg), SpO2 95 %. Body mass index is 38.5 kg/m.   Physical Exam:  General: Alert, interactive, in no acute distress. Adorable elderly female.  Eyes: No conjunctival injection bilaterally, no discharge  on the right, no discharge on the left and no Horner-Trantas dots present. PERRL bilaterally. EOMI without pain. No photophobia.  Ears: Right TM pearly gray with normal light reflex, Left TM pearly gray with normal light reflex, Right TM intact without perforation and Left TM intact without perforation.  Nose/Throat: External nose within normal limits and septum midline. Turbinates markedly edematous and pale without discharge. Posterior oropharynx mildly erythematous without cobblestoning in the posterior oropharynx. Tonsils 2+ without exudates.  Tongue without thrush. Adenopathy: no enlarged lymph nodes appreciated in the anterior cervical, occipital, axillary, epitrochlear, inguinal, or popliteal regions. Lungs: Clear to auscultation without wheezing, rhonchi or rales. No increased work of breathing. CV: Normal S1/S2. No murmurs. Capillary refill <2 seconds.  Skin: Warm and dry, without lesions or rashes. Neuro:   Grossly intact. No focal deficits appreciated. Responsive to questions.  Diagnostic studies:   Spirometry: results normal (FEV1: 1.01/98%, FVC: 1.27/98%, FEV1/FVC: 80%).    Spirometry consistent with normal pattern.    Allergy Studies: none      Salvatore Marvel, MD [facility]

## 2017-07-24 NOTE — Patient Instructions (Addendum)
1. Allergic rhinoconjunctivitis - Continue Nasonex 1-2 sprays per nostril daily. - Continue cetirizine 10mg  daily.  2. Moderate persistent asthma - Lung testing looked great today.  - There is no need for any changes at this time.  - Daily controller medication(s): Symbicort 160/4.5 two puffs twice daily with spacer - Rescue medications: ProAir 4 puffs every 4-6 hours as needed - Asthma control goals:  * Full participation in all desired activities (may need albuterol before activity) * Albuterol use two time or less a week on average (not counting use with activity) * Cough interfering with sleep two time or less a month * Oral steroids no more than once a year * No hospitalizations  3. Adverse food reactions (peanuts, soy, shellfish) - Continue to avoid these foods. - EpiPen is up to date.   4. Return in about 6 months (around 01/21/2018).   Please inform us of any Emergency Department visits, hospitalizations, or changes in symptoms. Call us before going to the ED for breathing or allergy symptoms since we might be able to fit you in for a sick visit. Feel free to contact us anytime with any questions, problems, or concerns.  It was a pleasure to see you again today! Happy New Year!     Websites that have reliable patient information: 1. American Academy of Asthma, Allergy, and Immunology: www.aaaai.org 2. Food Allergy Research and Education (FARE): foodallergy.org 3. Mothers of Asthmatics: http://www.asthmacommunitynetwork.org 4. American College of Allergy, Asthma, and Immunology: www.acaai.org

## 2017-07-30 ENCOUNTER — Telehealth: Payer: Self-pay | Admitting: Neurology

## 2017-07-30 NOTE — Telephone Encounter (Signed)
Spoke with patient. She stated that she did not have any further questions about the Cellcept. She is taking 2 pills BID. She will not stop unless instructed by a physician.

## 2017-07-30 NOTE — Telephone Encounter (Signed)
Mary with [facility] is calling to see what patient is taking for Myasthenia Gravis. She said the patient is not sure what she takes.

## 2017-07-30 NOTE — Telephone Encounter (Signed)
Spoke with [facility] @ [place]. She stated that when she spoke with patient this morning re: two prescriptions of eye drops & ointment, pt asked if she should continue her myasthenia gravis medication. Mary encouraged pt to continue as prescribed since those medications were not related. Patient had verbalized understanding and would refill her Cellcept.   RN will call patient to make sure there is no confusion.   The patient will be undergoing a lid surgery.

## 2017-08-11 ENCOUNTER — Other Ambulatory Visit: Payer: Self-pay | Admitting: Neurology

## 2017-08-11 ENCOUNTER — Other Ambulatory Visit (INDEPENDENT_AMBULATORY_CARE_PROVIDER_SITE_OTHER): Payer: Self-pay

## 2017-08-11 DIAGNOSIS — G7000 Myasthenia gravis without (acute) exacerbation: Secondary | ICD-10-CM

## 2017-08-11 DIAGNOSIS — Z0289 Encounter for other administrative examinations: Secondary | ICD-10-CM

## 2017-08-12 ENCOUNTER — Encounter: Payer: Self-pay | Admitting: *Deleted

## 2017-08-12 LAB — CBC WITH DIFFERENTIAL/PLATELET
BASOS ABS: 0 10*3/uL (ref 0.0–0.2)
Basos: 0 %
EOS (ABSOLUTE): 0.2 10*3/uL (ref 0.0–0.4)
Eos: 3 %
HEMOGLOBIN: 10.1 g/dL — AB (ref 11.1–15.9)
Hematocrit: 30.3 % — ABNORMAL LOW (ref 34.0–46.6)
Immature Grans (Abs): 0 10*3/uL (ref 0.0–0.1)
Immature Granulocytes: 0 %
Lymphocytes Absolute: 2.6 10*3/uL (ref 0.7–3.1)
Lymphs: 32 %
MCH: 27.2 pg (ref 26.6–33.0)
MCHC: 33.3 g/dL (ref 31.5–35.7)
MCV: 82 fL (ref 79–97)
MONOCYTES: 6 %
Monocytes Absolute: 0.5 10*3/uL (ref 0.1–0.9)
Neutrophils Absolute: 4.6 10*3/uL (ref 1.4–7.0)
Neutrophils: 59 %
PLATELETS: 203 10*3/uL (ref 150–379)
RBC: 3.72 x10E6/uL — AB (ref 3.77–5.28)
RDW: 14.7 % (ref 12.3–15.4)
WBC: 8 10*3/uL (ref 3.4–10.8)

## 2017-08-12 LAB — COMPREHENSIVE METABOLIC PANEL
ALBUMIN: 4 g/dL (ref 3.5–4.7)
ALK PHOS: 71 IU/L (ref 39–117)
ALT: 9 IU/L (ref 0–32)
AST: 16 IU/L (ref 0–40)
Albumin/Globulin Ratio: 1.7 (ref 1.2–2.2)
BILIRUBIN TOTAL: 0.6 mg/dL (ref 0.0–1.2)
BUN / CREAT RATIO: 15 (ref 12–28)
BUN: 29 mg/dL — AB (ref 8–27)
CHLORIDE: 102 mmol/L (ref 96–106)
CO2: 25 mmol/L (ref 20–29)
Calcium: 9.7 mg/dL (ref 8.7–10.3)
Creatinine, Ser: 1.88 mg/dL — ABNORMAL HIGH (ref 0.57–1.00)
GFR calc Af Amer: 29 mL/min/{1.73_m2} — ABNORMAL LOW (ref >59)
GFR calc non Af Amer: 25 mL/min/{1.73_m2} — ABNORMAL LOW (ref >59)
Globulin, Total: 2.4 g/dL (ref 1.5–4.5)
Glucose: 118 mg/dL — ABNORMAL HIGH (ref 65–99)
POTASSIUM: 3.7 mmol/L (ref 3.5–5.2)
Sodium: 143 mmol/L (ref 134–144)
Total Protein: 6.4 g/dL (ref 6.0–8.5)

## 2017-08-26 NOTE — Telephone Encounter (Signed)
Spoke with pt. She is aware that labs are stable and that Dr. [name] has advised to now have labs every 3 months. She verbalized appreciation and understanding. She stated that she will see Dr. [name] later this month (has appt 3/27).

## 2017-09-10 ENCOUNTER — Encounter: Payer: Self-pay | Admitting: Neurology

## 2017-09-10 ENCOUNTER — Ambulatory Visit: Payer: Medicare Other | Admitting: Neurology

## 2017-09-10 VITALS — BP 116/64 | HR 68 | Ht 59.0 in | Wt 178.6 lb

## 2017-09-10 DIAGNOSIS — G7000 Myasthenia gravis without (acute) exacerbation: Secondary | ICD-10-CM | POA: Diagnosis not present

## 2017-09-10 NOTE — Progress Notes (Signed)
[facility]    Provider:  Dr [name] Referring Provider: Helane Rima, MD Primary Care Physician:  Helane Rima, MD  CC:  Myasthenia Gravis  Interval history; Doing well, no double vision, feels improved. No side effects to the medication. Taking medication regularly. No SOB, no double vision, no weakness.   Interval 03/12/2017:  Nicole Holland is a 82 y.o. female here as a referral from Dr. Lavone Neri for Myasthenia Gravis.ACHR binding antibody serum was positive. No double vision. Feels improved. She has finished her prednisone.  Will increase CellCept 1000 mg twice a day which is the preferred dose. Would prefer patient to come back in 3 months however she declines, so she does not want to come back for 6 months. We will do labs monthly until she sees me again. Discussed side effects again and asked her to please call our office if there is any side effects whatsoever.  Interval History 12/09/2016:  Nicole Holland is a 82 y.o. female here as a referral from Dr. Lavone Neri for Myasthenia Gravis.ACHR binding antibody serum was positive.  I had a long talk with her at last appointment however I don't the patient understands there may be memory problems. Asked her son to come along with her that she is here alone again today. I asked her to explain to me what is going on and she said "I really don't know what is going on". She had a long appointment with the ophthalmologist to discuss MG and also I spoke to her about MG but appears she has memory problems and a vague understanding.   [facility]  QMG:QQPYP Nicole Harbisonis a 82 y.o.femalehere as a referral from Dr. Dorna Bloom myogenic ptosis. She noticed eyelid drooping for a year. The eye symptoms don;t change with time of day, not better in the morning or evening. Pollen makes it worse, night driving is hard. No significant improvement after a nap. The left eye is worse. It is stable. People  think she is asleep when she is not actually asleep. No double vision. No difficulty swallowing, breathing, no voice changing. Pollen season has been bad. No voice changes. Her legs try to give out on her but that's been "going on forever" no new muscle or new difficulties lifting arms overhead or climbing stairs. She can comb and style hair, brush teeth, can get out of seats. No neck weakness.   Reviewed notes, labs and imaging from outside physicians, which showed:   Drooping of the right eye and left eye. Patient's [facility]. Eyelids have been drooping for the past year and getting worse, causing I was to feel heavy and rubs slightly as the eyes. She is not able to see as clearly as she used to. She feels is progressively getting worse. Eyes have been tearing and burning which makes vision blurry sometimes. She can't see as clear as she would like to due to her eyelids drooping down in her eyes very bothersome. In driving at night has become a problem. Reviewed exam which showed a 20/60 in 2040 distance 20/50 in 20/30 near, patient was not dilated, intraocular pressure is normal, pupils equally round and reactive to light no APD, open angles, extraocular movements intact, myogenic ptosis, anterior chamber is normal, adnexa normal. They were referred here for EMG. Myasthenia panel was sent and a ACHR binding antibody serum was positive.   Reviewed labs: Receptor binding antibodies were elevated at 0.29 otherwise blocking antibodies and modulating antibodies were negative.  Review of Systems: Patient complains of symptoms per HPI as well as the following symptoms: no SOB, no CP. Pertinent negatives per HPI. All others negative.    Social History   Socioeconomic History  . Marital status: Married    Spouse name: legally separated  . Number of children: 4  . Years of education: 20  . Highest education level: Not on file  Occupational History    Comment: NA  Social Needs  .  Financial resource strain: Not on file  . Food insecurity:    Worry: Not on file    Inability: Not on file  . Transportation needs:    Medical: Not on file    Non-medical: Not on file  Tobacco Use  . Smoking status: Never Smoker  . Smokeless tobacco: Never Used  Substance and Sexual Activity  . Alcohol use: No  . Drug use: No  . Sexual activity: Never    Birth control/protection: Post-menopausal    Comment: mensus age 58,G5P4, 25st preg age 69, b.c short time,couldn't tolerate  Lifestyle  . Physical activity:    Days per week: Not on file    Minutes per session: Not on file  . Stress: Not on file  Relationships  . Social connections:    Talks on phone: Not on file    Gets together: Not on file    Attends religious service: Not on file    Active member of club or organization: Not on file    Attends meetings of clubs or organizations: Not on file    Relationship status: Not on file  . Intimate partner violence:    Fear of current or ex partner: Not on file    Emotionally abused: Not on file    Physically abused: Not on file    Forced sexual activity: Not on file  Other Topics Concern  . Not on file  Social History Narrative   Lives alone   Caffeine- none   Right handed    Family History  Problem Relation Age of Onset  . Stroke Mother   . Diabetes Mother   . Cancer Mother   . Cancer Father        Lung cancer  . Diabetes Sister   . Cancer Sister        Unknown Cancer  . Diabetes Brother   . Cancer Brother        Unknown cancer  . Diabetes Brother   . Cancer Brother        Unknown cancer  . Cancer Brother        Unknown Cancer  . Diabetes Brother   . Cancer Brother        Unknown Cancer  . Autoimmune disease Neg Hx     Past Medical History:  Diagnosis Date  . Arthritis   . Asthma   . Breast cancer ([place]) 08/02/11   right low grade ductal ca in situ upper outer quad   . Gout   . Hyperlipidemia   . Hypertension   . S/P radiation therapy 10/28/11 -  12/02/11   Right Breast/ 5000cGy/ 25 Fractions  . Use of tamoxifen (Nolvadex) 12/04/11   Dr. Truddie Coco  . Vitamin D deficiency     Past Surgical History:  Procedure Laterality Date  . ABDOMINAL HYSTERECTOMY  1994   partial ovaries intact  . BREAST LUMPECTOMY  08/02/11   Lumpectomy right Breast; Low Grade Ductal Carcinoma In Situ with Associated Calcifications, ER 65%, PR 99%  . BREAST  SURGERY  02/01/2000   right breast subareolar core biopsy  . BREAST SURGERY  09/27/11   Right Breast; Excision of  Anterior Margin- Benign Breast Parenchyma, Negative for In situ and Invasive Cancer, Surgical Margins Negative, Benign skin, Negative for Malignancy  . CATARACT EXTRACTION, BILATERAL  2015, 2016  . CESAREAN SECTION    . TONSILLECTOMY      Current Outpatient Medications  Medication Sig Dispense Refill  . albuterol (PROAIR HFA) 108 (90 Base) MCG/ACT inhaler Inhale 2 puffs into the lungs every 6 (six) hours as needed for wheezing or shortness of breath. 1 Inhaler 1  . atorvastatin (LIPITOR) 20 MG tablet Take 20 mg by mouth at bedtime.  0  . budesonide-formoterol (SYMBICORT) 160-4.5 MCG/ACT inhaler Inhale 2 puffs into the lungs as needed. 1 Inhaler 5  . carvedilol (COREG) 6.25 MG tablet Take 12.5 mg by mouth 2 (two) times daily.  0  . furosemide (LASIX) 40 MG tablet Take 40 mg by mouth daily.  0  . glucose blood (ONE TOUCH ULTRA TEST) test strip TEST once daily    . guaiFENesin (MUCINEX) 600 MG 12 hr tablet Take 600 mg by mouth as needed.     Marland Kitchen losartan (COZAAR) 25 MG tablet Take 25 mg by mouth daily.  0  . mometasone (NASONEX) 50 MCG/ACT nasal spray Place 2 sprays into the nose daily. 17 g 5  . mycophenolate (CELLCEPT) 500 MG tablet Take 2 tablets (1,000 mg total) by mouth 2 (two) times daily. 120 tablet 11  . Olopatadine HCl (PATADAY) 0.2 % SOLN Apply 1 drop to eye as needed (itching).     Glory Rosebush DELICA LANCETS 16X MISC TEST ONCE DAILY AS DIRECTED    . Polyethylene Glycol 400 (BLINK TEARS) 0.25  % SOLN Apply 1 drop to eye as needed (dry eyes).     Marland Kitchen acetaminophen (TYLENOL) 650 MG CR tablet Take 1,300 mg by mouth every 8 (eight) hours as needed for pain.    . diphenhydrAMINE (BENADRYL) 25 mg capsule Take by mouth.    . EPINEPHrine (EPIPEN 2-PAK) 0.3 mg/0.3 mL IJ SOAJ injection     . Multiple Vitamins-Minerals (CENTRUM SILVER PO) Take 1 tablet by mouth daily.     Marland Kitchen olopatadine (PATANOL) 0.1 % ophthalmic solution instill 1 drop into both eyes daily  0  . Omega-3 Fatty Acids (FISH OIL) 1200 MG CAPS Take 1,200 mg by mouth 2 (two) times daily.     . vitamin C (ASCORBIC ACID) 500 MG tablet Take 500 mg by mouth daily.       No current facility-administered medications for this visit.     Allergies as of 09/10/2017 - Review Complete 09/10/2017  Allergen Reaction Noted  . Iodides Itching 10/25/2010  . Peanut oil Other (See Comments) 01/04/2016  . Shellfish allergy Other (See Comments) 01/04/2016  . Soybean-containing drug products Other (See Comments) 01/04/2016  . Vaccinium angustifolium Other (See Comments) 01/04/2016  . Mestinon [pyridostigmine bromide] Diarrhea 12/06/2016  . Penicillins Itching 10/25/2010    Vitals: BP 116/64 (BP Location: Right Arm, Patient Position: Sitting)   Pulse 68   Ht 4\' 11"  (1.499 Nicole)   Wt 178 lb 9.6 oz (81 kg)   BMI 36.07 kg/Nicole  Last Weight:  Wt Readings from Last 1 Encounters:  09/10/17 178 lb 9.6 oz (81 kg)   Last Height:   Ht Readings from Last 1 Encounters:  09/10/17 4\' 11"  (1.499 Nicole)     Exam: Gen: NAD, conversant, well nourised, obese, well  groomed  CV: RRR, +SEM. No Carotid Bruits. No peripheral edema, warm, nontender Eyes: Conjunctivae clear without exudates or hemorrhage  Neuro: Detailed Neurologic Exam  Speech: Speech is normal; fluent and spontaneous with normal comprehension.  Cognition: The patient is oriented to person, place, and time;  recent and remote memory intact;  language fluent;    normal attention, concentration,  fund of knowledge Cranial Nerves: The pupils are equal, round, and reactive to light. Attempted fundoscopic exam could not visualize. Visual fields are full to finger confrontation. Impaired left upgaze with fatiguability. Trigeminal sensation is intact and the muscles of mastication are normal. Left> right ptosis. Left to mid pupil right just above pupil.The palate elevates in the midline. Hearing intact. Voice is normal. Shoulder shrug is normal. The tongue has normal motion without fasciculations.   Coordination: No dysmetria  Gait: Can get on heels and toes, gait mild waddling  Motor Observation: can get out of chair without using hands. no involuntary movements noted. Tone: Normal muscle tone.   Posture: Posture is normal. normal erect  Strength: Improved, mild weakness proximal left arm otherwise appears intact and symmetric.  Otherwise intact   Sensation: intact to LT  Reflex Exam:  DTR's: Deep tendon reflexes in the upper and lower extremities are brisk bilaterally.  Toes: The toes are downgoing bilaterally.  Clonus: Clonus is absent.  +ice pack test      Assessment/Plan:  82 year old with seropositive myasthenia gravis, acetylcholine receptor binding antibody positive, doing well  - Will hold off on emg/ncs, don't think we need an emg/ncs to diagnose myasthenia gravis in this case -  Did not tolerate Mestinon 30mg  3x a day, doing well on cellcept, increased strength, no symptoms of MG - CT of the chest to eval for thymoma: Negative - Continue CellCept 1000 mg twice daily.  Discussed side effects again, call for any side effects at all. We'll continue lab testing every 3-4 months until she sees me.   Continue Cellcept, discussed side effects of patient.. Likely takes at least 6 months and perhaps longer to produce significant benefit. There is evidence that mycophenolate has a  more rapid onset of clinical effect.The medicine comes in 250 mg and 500 mg tablets. The standard dose is 1000 mg twice a day. Some people need a higher dose of 1500 mg twice a day. The primary side effect that you might notice is gastrointestinal upset. To help overcome this, you should begin the medicine at one 250 mg tablet twice a day for the first five days and increase by adding one tablet morning and evening every five days until you get to the total dose of four tablets twice a day. Labs weekly for 4 weeks, then biweekly for 4 weeks and monthly afterwards. Discussed all side effects as per patient instructions and above including risk of rare brain disease calledPML.  Again, Discussed that shouldshe have acute worsening especially shortness of breath patient should call 911 and be transported to the emergency room due to risks for myasthenia gravis exacerbation and respiratory failure.   Sarina Ill, MD  [facility] [address] [place], [place] [address]  Phone [phone] Fax [phone]  A total of 15 minutes was spent face-to-face with this patient. Over half this time was spent on counseling patient on the myasthenia gravis diagnosis and different diagnostic and therapeutic options available.

## 2017-12-08 ENCOUNTER — Encounter: Payer: Self-pay | Admitting: Allergy

## 2017-12-08 ENCOUNTER — Ambulatory Visit: Payer: Medicare Other | Admitting: Allergy

## 2017-12-08 VITALS — BP 118/68 | HR 84 | Temp 98.2°F | Resp 20

## 2017-12-08 DIAGNOSIS — T781XXD Other adverse food reactions, not elsewhere classified, subsequent encounter: Secondary | ICD-10-CM

## 2017-12-08 DIAGNOSIS — J4541 Moderate persistent asthma with (acute) exacerbation: Secondary | ICD-10-CM | POA: Diagnosis not present

## 2017-12-08 DIAGNOSIS — J0100 Acute maxillary sinusitis, unspecified: Secondary | ICD-10-CM | POA: Diagnosis not present

## 2017-12-08 DIAGNOSIS — J309 Allergic rhinitis, unspecified: Secondary | ICD-10-CM | POA: Diagnosis not present

## 2017-12-08 DIAGNOSIS — H1010 Acute atopic conjunctivitis, unspecified eye: Secondary | ICD-10-CM

## 2017-12-08 MED ORDER — DOXYCYCLINE HYCLATE 100 MG PO TABS: 100.0000 mg | ORAL_TABLET | Freq: Two times a day (BID) | ORAL | 0 refills | Status: AC

## 2017-12-08 NOTE — Patient Instructions (Addendum)
Acute Sinusitis - take doxycycline 100mg  twice a day x 10 days - take with food - monitor for fevers  - may continue Mucinex 600mg  1-2 times a day and drink with plenty of water to loosen/thin mucus - continue ginger chews and can drink hot teas with ginger and lemon  Moderate persistent asthma with exacerbation - Lung testing looks normal however very symptomatic   - Daily controller medication(s): Symbicort 160/4.5 two puffs twice daily with spacer - Rescue medications: ProAir 4 puffs every 4-6 hours as needed.   Take your albuterol every 4 hours while awake for the next 3-4 days then go back to as needed use - for flare up today depomedrol injection provided in office - Asthma control goals:  * Full participation in all desired activities (may need albuterol before activity) * Albuterol use two time or less a week on average (not counting use with activity) * Cough interfering with sleep two time or less a month * Oral steroids no more than once a year * No hospitalizations   Allergic rhinoconjunctivitis - Continue Astelin 1 spray twice daily. - Continue cetirizine 10mg  daily.  Adverse food reactions (peanuts, soy, shellfish) - Continue to avoid these foods. - EpiPen is up to date.   Return in about 3-4 months or sooner if needed

## 2017-12-08 NOTE — Progress Notes (Signed)
Follow-up Note  RE: Nicole Holland MRN: [id number] DOB: June 17, 1936 Date of Office Visit: 12/08/2017   History of present illness: Nicole Holland is a 82 y.o. female presenting today for sick visit.  She has history of mod persistent asthma, allergic rhinitis, food allergy, angioedema and myasthenia gravis on cellcept therapy.  She was last seen in the office on 07/24/17 by Dr. Novella Rob.  She presents today as she states the pollen has triggered an asthma flare.  She reports 3-4 weeks of SOB, cough with green/yellow sputum, wheezing, chest congestion, head congestion.  She also states that she has been having what feels like "hot flashes" and last week had temps with the highest reading of 100.  She states she is taking Mucinex 1 tablet daily.  She is taking her symbicort 2 puffs twice a day and using her albuterol 1-2 times a day.  Denies nighttime awakenings.  She stats she is not taking nasonex but instead taking azelastine 1 spray each nostril twice a day which is helping.  She takes zyrtec daily.     For her myasthenia gravis is no longer on prednisone and is just on cellcept for control.  She states prednisone when she was on it more long term she was having GI upset and diarrhea.    Review of systems: Review of Systems  Constitutional: Positive for malaise/fatigue. Negative for chills and fever.  HENT: Positive for congestion and sinus pain. Negative for ear discharge, ear pain, nosebleeds and sore throat.   Eyes: Negative for pain, discharge and redness.  Respiratory: Positive for cough, sputum production, shortness of breath and wheezing. Negative for hemoptysis.   Cardiovascular: Negative for chest pain.  Gastrointestinal: Negative for abdominal pain, constipation, diarrhea, heartburn, nausea and vomiting.  Musculoskeletal: Negative for joint pain.  Skin: Negative for itching and rash.  Neurological: Positive for headaches.    All other systems negative unless noted above in  HPI  Past medical/social/surgical/family history have been reviewed and are unchanged unless specifically indicated below.  No changes  Medication List: Allergies as of 12/08/2017      Reactions   Iodides Itching   TOTAL BODY   Peanut Oil Other (See Comments)   Recent allergy testing   Shellfish Allergy Other (See Comments)   Per allergy testing   Soybean-containing Drug Products Other (See Comments)   Per allergy testing   Vaccinium Angustifolium Other (See Comments)   Per allergy testing   Mestinon [pyridostigmine Bromide] Diarrhea   "Stomach cramps, blood in stool"   Penicillins Itching   Has patient had a PCN reaction causing immediate rash, facial/tongue/throat swelling, SOB or lightheadedness with hypotension: no-- pt did not have rash but was itchy all over body Has patient had a PCN reaction causing severe rash involving mucus membranes or skin necrosis: no Has patient had a PCN reaction that required hospitalization no Has patient had a PCN reaction occurring within the last 10 years: no If all of the above answers are "NO", then may proceed with Cephalosporin use.      Medication List        Accurate as of 12/08/17  4:28 PM. Always use your most recent med list.          albuterol 108 (90 Base) MCG/ACT inhaler Commonly known as:  PROAIR HFA Inhale 2 puffs into the lungs every 6 (six) hours as needed for wheezing or shortness of breath.   atorvastatin 20 MG tablet Commonly known as:  LIPITOR Take  20 mg by mouth at bedtime.   azelastine 0.1 % nasal spray Commonly known as:  ASTELIN Place 1 spray into both nostrils 2 (two) times daily. Use in each nostril as directed   BLINK TEARS 0.25 % Soln Generic drug:  Polyethylene Glycol 400 Apply 1 drop to eye as needed (dry eyes).   budesonide-formoterol 160-4.5 MCG/ACT inhaler Commonly known as:  SYMBICORT Inhale 2 puffs into the lungs as needed.   carvedilol 6.25 MG tablet Commonly known as:  COREG Take 12.5  mg by mouth 2 (two) times daily.   CENTRUM SILVER PO Take 1 tablet by mouth daily.   diphenhydrAMINE 25 mg capsule Commonly known as:  BENADRYL Take by mouth.   EPIPEN 2-PAK 0.3 mg/0.3 mL Soaj injection Generic drug:  EPINEPHrine   Fish Oil 1200 MG Caps Take 1,200 mg by mouth 2 (two) times daily.   furosemide 40 MG tablet Commonly known as:  LASIX Take 40 mg by mouth daily.   guaiFENesin 600 MG 12 hr tablet Commonly known as:  MUCINEX Take 600 mg by mouth as needed.   losartan 25 MG tablet Commonly known as:  COZAAR Take 25 mg by mouth daily.   mometasone 50 MCG/ACT nasal spray Commonly known as:  NASONEX Place 2 sprays into the nose daily.   mycophenolate 500 MG tablet Commonly known as:  CELLCEPT Take 2 tablets (1,000 mg total) by mouth 2 (two) times daily.   ONE TOUCH ULTRA TEST test strip Generic drug:  glucose blood TEST once daily   ONETOUCH DELICA LANCETS 73X Misc TEST ONCE DAILY AS DIRECTED   PATADAY 0.2 % Soln Generic drug:  Olopatadine HCl Apply 1 drop to eye as needed (itching).   olopatadine 0.1 % ophthalmic solution Commonly known as:  PATANOL instill 1 drop into both eyes daily   vitamin C 500 MG tablet Commonly known as:  ASCORBIC ACID Take 500 mg by mouth daily.       Known medication allergies: Allergies  Allergen Reactions  . Iodides Itching    TOTAL BODY  . Peanut Oil Other (See Comments)    Recent allergy testing  . Shellfish Allergy Other (See Comments)    Per allergy testing  . Soybean-Containing Drug Products Other (See Comments)    Per allergy testing  . Vaccinium Angustifolium Other (See Comments)    Per allergy testing  . Mestinon [Pyridostigmine Bromide] Diarrhea    "Stomach cramps, blood in stool"  . Penicillins Itching    Has patient had a PCN reaction causing immediate rash, facial/tongue/throat swelling, SOB or lightheadedness with hypotension: no-- pt did not have rash but was itchy all over body Has patient had  a PCN reaction causing severe rash involving mucus membranes or skin necrosis: no Has patient had a PCN reaction that required hospitalization no Has patient had a PCN reaction occurring within the last 10 years: no If all of the above answers are "NO", then may proceed with Cephalosporin use.       Physical examination: Blood pressure 118/68, pulse 84, temperature 98.2 F (36.8 C), temperature source Oral, resp. rate 20, SpO2 95 %.  General: Alert, interactive, in no acute distress. HEENT: PERRLA, TMs pearly gray, turbinates moderately edematous with clear discharge, post-pharynx non erythematous. Neck: Supple without lymphadenopathy. Lungs: Mildly decreased breath sounds with expiratory wheezing bilaterally. {no increased work of breathing.  Improved aeration and decreased wheezing after BD.   CV: Normal S1, S2 without murmurs. Abdomen: Nondistended, nontender. Skin: Warm and dry, without  lesions or rashes. Extremities:  No clubbing, cyanosis or edema. Neuro:   Grossly intact.  Diagnositics/Labs:  Spirometry: FEV1: 0.62L  117%, FVC: 0.79L 108%, ratio consistent with nonobstructive pattern  Assessment and plan:   Acute Sinusitis - take doxycycline 100mg  twice a day x 10 days - take with food - monitor for fevers  - may continue Mucinex 600mg  1-2 times a day and drink with plenty of water to loosen/thin mucus - continue ginger chews and can drink hot teas with ginger and lemon  Moderate persistent asthma with exacerbation - Lung testing looks normal however very symptomatic   - Daily controller medication(s): Symbicort 160/4.5 two puffs twice daily with spacer - Rescue medications: ProAir 4 puffs every 4-6 hours as needed.   Take your albuterol every 4 hours while awake for the next 3-4 days then go back to as needed use - for flare up today depomedrol 80mg  injection provided in office - Asthma control goals:  * Full participation in all desired activities (may need albuterol  before activity) * Albuterol use two time or less a week on average (not counting use with activity) * Cough interfering with sleep two time or less a month * Oral steroids no more than once a year * No hospitalizations   Allergic rhinoconjunctivitis - Continue Astelin 1 spray twice daily. - Continue cetirizine 10mg  daily.  Adverse food reactions (peanuts, soy, shellfish) - Continue to avoid these foods. - EpiPen is up to date.   Return in about 3-4 months or sooner if needed  I appreciate the opportunity to take part in Ewelina's care. Please do not hesitate to contact me with questions.  Sincerely,   Prudy Feeler, MD Allergy/Immunology [facility]

## 2017-12-09 MED ORDER — METHYLPREDNISOLONE ACETATE 80 MG/ML IJ SUSP
80.0000 mg | Freq: Once | INTRAMUSCULAR | Status: AC
  Administered 2017-12-08: 80 mg via INTRAMUSCULAR

## 2017-12-09 NOTE — Addendum Note (Signed)
Addended by: Orlene Erm on: 12/09/2017 11:19 AM   Modules accepted: Orders

## 2017-12-09 NOTE — Addendum Note (Signed)
Addended by: Lucrezia Starch I on: 12/09/2017 07:11 AM   Modules accepted: Orders

## 2017-12-09 NOTE — Addendum Note (Signed)
Addended by: Isabel Caprice on: 12/09/2017 09:07 AM   Modules accepted: Orders

## 2017-12-24 ENCOUNTER — Telehealth: Payer: Self-pay | Admitting: Neurology

## 2017-12-24 NOTE — Telephone Encounter (Signed)
She can see me every 6 months and we can do labs at visit, no need to come in every month anymore thanks

## 2017-12-24 NOTE — Telephone Encounter (Signed)
Pt requesting a call, stating she is unsure if she needs to f/u this month or next for lab work. Please call to advise,.

## 2017-12-25 NOTE — Telephone Encounter (Signed)
Noted  

## 2017-12-25 NOTE — Telephone Encounter (Signed)
Advised pt of what was stated per RN, scheduled pt appt with Dr. Wendi Snipes preferred MD) for 9/10. Pt agreeable to plan and appreciated the call

## 2017-12-25 NOTE — Telephone Encounter (Signed)
Called pt and LVM (ok per DPR) informing pt that Dr. [name] says she can come in every 6 months and see her and get labs. No need to come in monthly anymore. Asked for a call back so we can get her scheduled to see Dr. [name] in September. Next office visit was scheduled for 2020 but we will see her sooner. Left office number in message.

## 2017-12-26 ENCOUNTER — Telehealth: Payer: Self-pay | Admitting: Allergy

## 2017-12-26 MED ORDER — BUDESONIDE-FORMOTEROL FUMARATE 160-4.5 MCG/ACT IN AERO: 2.0000 | INHALATION_SPRAY | RESPIRATORY_TRACT | 5 refills | Status: DC | PRN

## 2017-12-26 NOTE — Telephone Encounter (Signed)
Patient needs a refill on SYMBICORT sent in to the [facility] on [address] - patient is down to 10

## 2017-12-26 NOTE — Telephone Encounter (Signed)
Prescription refill has been sent to requested pharmacy.

## 2018-01-08 ENCOUNTER — Ambulatory Visit: Payer: Medicare Other | Admitting: Allergy

## 2018-01-08 ENCOUNTER — Encounter: Payer: Self-pay | Admitting: Allergy

## 2018-01-08 VITALS — BP 130/90 | HR 75 | Temp 98.0°F | Resp 15 | Ht <= 58 in | Wt 176.6 lb

## 2018-01-08 DIAGNOSIS — H1010 Acute atopic conjunctivitis, unspecified eye: Secondary | ICD-10-CM | POA: Diagnosis not present

## 2018-01-08 DIAGNOSIS — J4541 Moderate persistent asthma with (acute) exacerbation: Secondary | ICD-10-CM

## 2018-01-08 DIAGNOSIS — J320 Chronic maxillary sinusitis: Secondary | ICD-10-CM | POA: Diagnosis not present

## 2018-01-08 DIAGNOSIS — J309 Allergic rhinitis, unspecified: Secondary | ICD-10-CM | POA: Diagnosis not present

## 2018-01-08 MED ORDER — METHYLPREDNISOLONE ACETATE 80 MG/ML IJ SUSP
80.0000 mg | Freq: Once | INTRAMUSCULAR | Status: AC
  Administered 2018-01-08: 80 mg via INTRAMUSCULAR

## 2018-01-08 MED ORDER — CEFIXIME 400 MG PO CAPS: 400.0000 mg | ORAL_CAPSULE | Freq: Every day | ORAL | 0 refills | Status: AC

## 2018-01-08 MED ORDER — CLINDAMYCIN HCL 300 MG PO CAPS: 300.0000 mg | ORAL_CAPSULE | Freq: Three times a day (TID) | ORAL | 0 refills | Status: AC

## 2018-01-08 MED ORDER — ALBUTEROL SULFATE HFA 108 (90 BASE) MCG/ACT IN AERS: 2.0000 | INHALATION_SPRAY | Freq: Four times a day (QID) | RESPIRATORY_TRACT | 1 refills | Status: DC | PRN

## 2018-01-08 NOTE — Progress Notes (Signed)
Follow-up Note  RE: Nicole Holland MRN: [id number] DOB: 1936/05/27 Date of Office Visit: 01/08/2018   History of present illness: Nicole Holland is a 82 y.o. female presenting today for SICK VISIT. She was last seen in the office on 12/08/17 by myself for acute sinusitis treated with doxycycline and depomedrol injection in the office.  She states she did not get better after her last visit but symptoms started to return about 2 weeks ago.  Symptoms include chest congestion, cough with productive yellow sputum, wheezing.  She feels that the abrupt changes in temperature and weather are a big factor in her current symptoms.  She is needing to use her rescue inhaler at least once a day if not twice a day for the past several weeks.  She does get relief of symptoms with use of her rescue inhaler.  She is taking her Symbicort 160 mcg 2 puffs twice a day.  She is also taking Mucinex daily.  She is continuing on her routine allergy medications of Astelin and Zyrtec.  She denies any fevers chills or night sweats but does feel that her throat is starting to get sore.   Review of systems: Review of Systems  Constitutional: Negative for chills, fever and malaise/fatigue.  HENT: Positive for congestion and sore throat. Negative for ear discharge, ear pain, nosebleeds and sinus pain.   Eyes: Negative for pain, discharge and redness.  Respiratory: Positive for cough, sputum production, shortness of breath and wheezing. Negative for hemoptysis.   Cardiovascular: Negative for chest pain.  Gastrointestinal: Negative for abdominal pain, constipation, diarrhea, heartburn, nausea and vomiting.  Musculoskeletal: Negative for joint pain.  Skin: Negative for itching and rash.  Neurological: Negative for headaches.    All other systems negative unless noted above in HPI  Past medical/social/surgical/family history have been reviewed and are unchanged unless specifically indicated below.  No  changes  Medication List: Allergies as of 01/08/2018      Reactions   Iodides Itching   TOTAL BODY   Peanut Oil Other (See Comments)   Recent allergy testing   Shellfish Allergy Other (See Comments)   Per allergy testing   Soybean-containing Drug Products Other (See Comments)   Per allergy testing   Vaccinium Angustifolium Other (See Comments)   Per allergy testing   Mestinon [pyridostigmine Bromide] Diarrhea   "Stomach cramps, blood in stool"   Penicillins Itching   Has patient had a PCN reaction causing immediate rash, facial/tongue/throat swelling, SOB or lightheadedness with hypotension: no-- pt did not have rash but was itchy all over body Has patient had a PCN reaction causing severe rash involving mucus membranes or skin necrosis: no Has patient had a PCN reaction that required hospitalization no Has patient had a PCN reaction occurring within the last 10 years: no If all of the above answers are "NO", then may proceed with Cephalosporin use.      Medication List        Accurate as of 01/08/18 12:23 PM. Always use your most recent med list.          albuterol 108 (90 Base) MCG/ACT inhaler Commonly known as:  PROAIR HFA Inhale 2 puffs into the lungs every 6 (six) hours as needed for wheezing or shortness of breath.   atorvastatin 20 MG tablet Commonly known as:  LIPITOR Take 20 mg by mouth at bedtime.   azelastine 0.1 % nasal spray Commonly known as:  ASTELIN Place 1 spray into both nostrils 2 (two)  times daily. Use in each nostril as directed   BLINK TEARS 0.25 % Soln Generic drug:  Polyethylene Glycol 400 Apply 1 drop to eye as needed (dry eyes).   budesonide-formoterol 160-4.5 MCG/ACT inhaler Commonly known as:  SYMBICORT Inhale 2 puffs into the lungs as needed.   carvedilol 6.25 MG tablet Commonly known as:  COREG Take 12.5 mg by mouth 2 (two) times daily.   CENTRUM SILVER PO Take 1 tablet by mouth daily.   diphenhydrAMINE 25 mg capsule Commonly  known as:  BENADRYL Take by mouth.   EPIPEN 2-PAK 0.3 mg/0.3 mL Soaj injection Generic drug:  EPINEPHrine   Fish Oil 1200 MG Caps Take 1,200 mg by mouth 2 (two) times daily.   furosemide 40 MG tablet Commonly known as:  LASIX Take 40 mg by mouth daily.   guaiFENesin 600 MG 12 hr tablet Commonly known as:  MUCINEX Take 600 mg by mouth as needed.   losartan 25 MG tablet Commonly known as:  COZAAR Take 25 mg by mouth daily.   mometasone 50 MCG/ACT nasal spray Commonly known as:  NASONEX Place 2 sprays into the nose daily.   mycophenolate 500 MG tablet Commonly known as:  CELLCEPT Take 2 tablets (1,000 mg total) by mouth 2 (two) times daily.   ONE TOUCH ULTRA TEST test strip Generic drug:  glucose blood TEST once daily   ONETOUCH DELICA LANCETS 82N Misc TEST ONCE DAILY AS DIRECTED   PATADAY 0.2 % Soln Generic drug:  Olopatadine HCl Apply 1 drop to eye as needed (itching).   olopatadine 0.1 % ophthalmic solution Commonly known as:  PATANOL instill 1 drop into both eyes daily   vitamin C 500 MG tablet Commonly known as:  ASCORBIC ACID Take 500 mg by mouth daily.       Known medication allergies: Allergies  Allergen Reactions  . Iodides Itching    TOTAL BODY  . Peanut Oil Other (See Comments)    Recent allergy testing  . Shellfish Allergy Other (See Comments)    Per allergy testing  . Soybean-Containing Drug Products Other (See Comments)    Per allergy testing  . Vaccinium Angustifolium Other (See Comments)    Per allergy testing  . Mestinon [Pyridostigmine Bromide] Diarrhea    "Stomach cramps, blood in stool"  . Penicillins Itching    Has patient had a PCN reaction causing immediate rash, facial/tongue/throat swelling, SOB or lightheadedness with hypotension: no-- pt did not have rash but was itchy all over body Has patient had a PCN reaction causing severe rash involving mucus membranes or skin necrosis: no Has patient had a PCN reaction that required  hospitalization no Has patient had a PCN reaction occurring within the last 10 years: no If all of the above answers are "NO", then may proceed with Cephalosporin use.       Physical examination: Blood pressure 130/90, pulse 75, temperature 98 F (36.7 C), resp. rate 15, height 4\' 10"  (1.473 m), weight 176 lb 9.6 oz (80.1 kg), SpO2 98 %.  General: Alert, interactive, in no acute distress. HEENT: PERRLA, TMs pearly gray, turbinates mildly edematous without discharge, post-pharynx non erythematous. Neck: Supple without lymphadenopathy. Lungs: Mildly decreased breath sounds with expiratory wheezing bilaterally. {no increased work of breathing.  Improved aeration with reduction and wheeze after bronchodilator CV: Normal S1, S2 without murmurs. Abdomen: Nondistended, nontender. Skin: Warm and dry, without lesions or rashes. Extremities:  No clubbing, cyanosis or edema. Neuro:   Grossly intact.  Diagnositics/Labs:  Spirometry:  FEV1: 0.64L 62%, FVC: 1.08L 79% she did not have significant improvement following bronchodilator  Assessment and plan:   Chronic Rhinosinusitis with acute exacerbation -She was recently treated with doxycycline and thus do not want to use this antibiotic for her current symptoms.  She also has a listed penicillin allergy and unfortunately she has myasthenia gravis that is controlled on CellCept thus do not want to use any fluoroquinolones as this could exacerbate myasthenia gravis symptoms.   Take double antibiotic regimen of Clindamycin 300mg  every 8 hours (3 times a day) x 7 days plus Suprax 400mg  daily x 7 days - monitor for fevers  - may continue Mucinex 600mg  1-2 times a day and drink with plenty of water to loosen/thin mucus - recommend nasal saline rinses  Moderate persistent asthma with exacerbation - Lung testing looks normal however very symptomatic   - Daily controller medication(s): Symbicort 160/4.5 two puffs twice daily with spacer - Rescue  medications: ProAir 4 puffs every 4-6 hours as needed.   Take your albuterol every 4 hours while awake for the next 3-4 days then go back to as needed use - depomedrol 80 mg injection provided in office today - Asthma control goals:  * Full participation in all desired activities (may need albuterol before activity) * Albuterol use two time or less a week on average (not counting use with activity) * Cough interfering with sleep two time or less a month * Oral steroids no more than once a year * No hospitalizations   Allergic rhinoconjunctivitis - Continue Astelin 1 spray twice daily. - Continue cetirizine 10mg  daily.  Adverse food reactions (peanuts, soy, shellfish) - Continue to avoid these foods. - EpiPen is up to date.   Return in about 3-4 months or sooner if needed   I appreciate the opportunity to take part in Nicole Holland's care. Please do not hesitate to contact me with questions.  Sincerely,   Prudy Feeler, MD Allergy/Immunology [facility]

## 2018-01-08 NOTE — Patient Instructions (Addendum)
Chronic Rhinosinusitis with acute exacerbation - take double antibiotic regimen of Clindamycin 300mg  every 8 hours (3 times a day) x 7 days plus Suprax 400mg  daily x 7 days - monitor for fevers  - may continue Mucinex 600mg  1-2 times a day and drink with plenty of water to loosen/thin mucus - recommend nasal saline rinses  Moderate persistent asthma with exacerbation - Lung testing looks normal however very symptomatic   - Daily controller medication(s): Symbicort 160/4.5 two puffs twice daily with spacer - Rescue medications: ProAir 4 puffs every 4-6 hours as needed.   Take your albuterol every 4 hours while awake for the next 3-4 days then go back to as needed use - depomedrol injection provided in office today - Asthma control goals:  * Full participation in all desired activities (may need albuterol before activity) * Albuterol use two time or less a week on average (not counting use with activity) * Cough interfering with sleep two time or less a month * Oral steroids no more than once a year * No hospitalizations   Allergic rhinoconjunctivitis - Continue Astelin 1 spray twice daily. - Continue cetirizine 10mg  daily.  Adverse food reactions (peanuts, soy, shellfish) - Continue to avoid these foods. - EpiPen is up to date.   Return in about 3-4 months or sooner if needed

## 2018-01-09 NOTE — Addendum Note (Signed)
Addended by: Lucrezia Starch I on: 01/09/2018 07:22 AM   Modules accepted: Orders

## 2018-01-21 ENCOUNTER — Ambulatory Visit: Payer: Medicare Other | Admitting: Allergy & Immunology

## 2018-02-09 ENCOUNTER — Encounter: Payer: Self-pay | Admitting: Nurse Practitioner

## 2018-02-12 ENCOUNTER — Encounter: Payer: Self-pay | Admitting: Allergy

## 2018-02-12 ENCOUNTER — Ambulatory Visit: Payer: Medicare Other | Admitting: Allergy

## 2018-02-12 VITALS — BP 118/72 | HR 70 | Temp 97.8°F | Resp 18

## 2018-02-12 DIAGNOSIS — A0472 Enterocolitis due to Clostridium difficile, not specified as recurrent: Secondary | ICD-10-CM | POA: Diagnosis not present

## 2018-02-12 DIAGNOSIS — J309 Allergic rhinitis, unspecified: Secondary | ICD-10-CM | POA: Diagnosis not present

## 2018-02-12 DIAGNOSIS — H1010 Acute atopic conjunctivitis, unspecified eye: Secondary | ICD-10-CM

## 2018-02-12 DIAGNOSIS — J4540 Moderate persistent asthma, uncomplicated: Secondary | ICD-10-CM | POA: Diagnosis not present

## 2018-02-12 DIAGNOSIS — J320 Chronic maxillary sinusitis: Secondary | ICD-10-CM | POA: Diagnosis not present

## 2018-02-12 MED ORDER — OLOPATADINE HCL 0.1 % OP SOLN: OPHTHALMIC | 5 refills | Status: DC

## 2018-02-12 MED ORDER — ALBUTEROL SULFATE HFA 108 (90 BASE) MCG/ACT IN AERS: 2.0000 | INHALATION_SPRAY | Freq: Four times a day (QID) | RESPIRATORY_TRACT | 1 refills | Status: DC | PRN

## 2018-02-12 MED ORDER — BUDESONIDE-FORMOTEROL FUMARATE 160-4.5 MCG/ACT IN AERO: 2.0000 | INHALATION_SPRAY | RESPIRATORY_TRACT | 5 refills | Status: DC | PRN

## 2018-02-12 NOTE — Progress Notes (Signed)
Follow-up Note  RE: Nicole Holland MRN: [id number] DOB: 1936/05/17 Date of Office Visit: 02/12/2018   History of present illness: Nicole Holland is a 82 y.o. female presenting today for follow-up of chronic rhinosinusitis with acute exacerbation, asthma, allergic rhinoconjunctivitis.  She was last seen in the office on January 08, 2018 by myself.  At that time she was still complaining of chest congestion, cough with a productive sputum, wheezing and head congestion.  She had been treated previously about 1 to 2 months ago with a doxycycline course due to penicillin allergy and since she was not feeling any better elected to treat for broader coverage with clindamycin and Suprax.  This is a decision was made to treat with clindamycin and Suprax due to her history of myasthenia gravis as fluoroquinolones can worsen myasthenia gravis symptoms which I did not want to deal.  She did complete this double antibiotic course.  She states about a week or so ago she started to have a lot of diarrhea and she was seen in the urgent care over the weekend and had stool studies done that did return back positive for C. difficile.  She is on oral vancomycin, probiotic and prednisone that she has been advised to take 60 mg/day for for 8 days; she has had at least 3 days worth now.  She also had a chest x-ray as she complained of chest congestion as well as wheezing and rattling in her chest.  The chest x-ray was clear. Today she states that she still has "head congestion" and feels sinus pressure and a frontal headache as well as chest congestion with wheezing.  She does feel that the symptoms are improving now that she is on prednisone but it is not resolved.  She denies any fevers.  She has been using her regular medications of Symbicort 2 puffs twice a day as well as as needed albuterol, Astelin, cetirizine and Mucinex.  Review of systems: Review of Systems  Constitutional: Positive for malaise/fatigue. Negative  for chills and fever.  HENT: Positive for congestion, sinus pain and sore throat. Negative for ear discharge, ear pain and nosebleeds.   Eyes: Negative for pain, discharge and redness.  Respiratory: Positive for cough, sputum production and wheezing. Negative for stridor.   Cardiovascular: Negative for chest pain.  Gastrointestinal: Positive for abdominal pain and diarrhea. Negative for blood in stool, constipation, heartburn, nausea and vomiting.  Musculoskeletal: Negative for joint pain.  Skin: Negative for itching and rash.  Neurological: Positive for headaches. Negative for dizziness.    All other systems negative unless noted above in HPI  Past medical/social/surgical/family history have been reviewed and are unchanged unless specifically indicated below.  No changes  Medication List: Allergies as of 02/12/2018      Reactions   Iodides Itching   TOTAL BODY   Peanut Oil Other (See Comments)   Recent allergy testing   Shellfish Allergy Other (See Comments)   Per allergy testing   Soybean-containing Drug Products Other (See Comments)   Per allergy testing   Vaccinium Angustifolium Other (See Comments)   Per allergy testing   Mestinon [pyridostigmine Bromide] Diarrhea   "Stomach cramps, blood in stool"   Penicillins Itching   Has patient had a PCN reaction causing immediate rash, facial/tongue/throat swelling, SOB or lightheadedness with hypotension: no-- pt did not have rash but was itchy all over body Has patient had a PCN reaction causing severe rash involving mucus membranes or skin necrosis: no Has patient  had a PCN reaction that required hospitalization no Has patient had a PCN reaction occurring within the last 10 years: no If all of the above answers are "NO", then may proceed with Cephalosporin use.      Medication List        Accurate as of 02/12/18  7:28 PM. Always use your most recent med list.          albuterol 108 (90 Base) MCG/ACT inhaler Commonly known  as:  PROVENTIL HFA;VENTOLIN HFA Inhale 2 puffs into the lungs every 6 (six) hours as needed for wheezing or shortness of breath.   atorvastatin 20 MG tablet Commonly known as:  LIPITOR Take 20 mg by mouth at bedtime.   azelastine 0.1 % nasal spray Commonly known as:  ASTELIN Place 1 spray into both nostrils 2 (two) times daily. Use in each nostril as directed   BLINK TEARS 0.25 % Soln Generic drug:  Polyethylene Glycol 400 Apply 1 drop to eye as needed (dry eyes).   budesonide-formoterol 160-4.5 MCG/ACT inhaler Commonly known as:  SYMBICORT Inhale 2 puffs into the lungs as needed.   carvedilol 6.25 MG tablet Commonly known as:  COREG Take 12.5 mg by mouth 2 (two) times daily.   CENTRUM SILVER PO Take 1 tablet by mouth daily.   diphenhydrAMINE 25 mg capsule Commonly known as:  BENADRYL Take by mouth.   EPIPEN 2-PAK 0.3 mg/0.3 mL Soaj injection Generic drug:  EPINEPHrine   Fish Oil 1200 MG Caps Take 1,200 mg by mouth 2 (two) times daily.   furosemide 40 MG tablet Commonly known as:  LASIX Take 40 mg by mouth daily.   guaiFENesin 600 MG 12 hr tablet Commonly known as:  MUCINEX Take 600 mg by mouth as needed.   loperamide 1 MG/5ML solution Commonly known as:  IMODIUM Take by mouth.   losartan 25 MG tablet Commonly known as:  COZAAR Take 25 mg by mouth daily.   mometasone 50 MCG/ACT nasal spray Commonly known as:  NASONEX Place 2 sprays into the nose daily.   mycophenolate 500 MG tablet Commonly known as:  CELLCEPT Take 2 tablets (1,000 mg total) by mouth 2 (two) times daily.   ONE TOUCH ULTRA TEST test strip Generic drug:  glucose blood TEST once daily   ONETOUCH DELICA LANCETS 93X Misc TEST ONCE DAILY AS DIRECTED   PATADAY 0.2 % Soln Generic drug:  Olopatadine HCl Apply 1 drop to eye as needed (itching).   olopatadine 0.1 % ophthalmic solution Commonly known as:  PATANOL instill 1 drop into both eyes daily   predniSONE 10 MG (48) Tbpk  tablet Commonly known as:  STERAPRED UNI-PAK 48 TAB Take by mouth.   PROBIOTIC ACIDOPHILUS PO Take by mouth.   vancomycin 125 MG capsule Commonly known as:  VANCOCIN Take by mouth.   vitamin C 500 MG tablet Commonly known as:  ASCORBIC ACID Take 500 mg by mouth daily.       Known medication allergies: Allergies  Allergen Reactions  . Iodides Itching    TOTAL BODY  . Peanut Oil Other (See Comments)    Recent allergy testing  . Shellfish Allergy Other (See Comments)    Per allergy testing  . Soybean-Containing Drug Products Other (See Comments)    Per allergy testing  . Vaccinium Angustifolium Other (See Comments)    Per allergy testing  . Mestinon [Pyridostigmine Bromide] Diarrhea    "Stomach cramps, blood in stool"  . Penicillins Itching    Has patient  had a PCN reaction causing immediate rash, facial/tongue/throat swelling, SOB or lightheadedness with hypotension: no-- pt did not have rash but was itchy all over body Has patient had a PCN reaction causing severe rash involving mucus membranes or skin necrosis: no Has patient had a PCN reaction that required hospitalization no Has patient had a PCN reaction occurring within the last 10 years: no If all of the above answers are "NO", then may proceed with Cephalosporin use.       Physical examination: Blood pressure 118/72, pulse 70, temperature 97.8 F (36.6 C), temperature source Oral, resp. rate 18, SpO2 98 %.  General: Alert, interactive, in no acute distress. HEENT: PERRLA, TMs pearly gray, turbinates mildly edematous without discharge, post-pharynx non erythematous. Neck: Supple without lymphadenopathy. Lungs: Clear to auscultation without wheezing, rhonchi or rales. {no increased work of breathing. CV: Normal S1, S2 without murmurs. Abdomen: Nondistended, nontender. Skin: Warm and dry, without lesions or rashes. Extremities:  No clubbing, cyanosis or edema. Neuro:   Grossly  intact.  Diagnositics/Labs: Imaging: Chest x-ray from 02/08/2018 impression clear.  Unable to view imaging Spirometry: FEV1: 0.62L 60%, FVC: 0.83L 61% lung function is reduced for age  Assessment and plan:   Chronic Rhinosinusitis  - ongoing 3 months of sinus congestion, HA despite appropriate antibiotic therapy for acute sinusitis - will obtain sinus CT to evaluate sinuses to determine if any chronic or acute change or sinus blockage - may continue Mucinex (or Mucinex DM) 600mg  1-2 times a day and drink with plenty of water to loosen/thin mucus - recommend nasal saline rinses  Moderate persistent asthma - recent exacerbation in past 3 months - Daily controller medication(s): Symbicort 160/4.5 two puffs twice daily with spacer - Rescue medications: ProAir 4 puffs every 4-6 hours as needed.   - Asthma control goals:  * Full participation in all desired activities (may need albuterol before activity) * Albuterol use two time or less a week on average (not counting use with activity) * Cough interfering with sleep two time or less a month * Oral steroids no more than once a year * No hospitalizations   Allergic rhinoconjunctivitis - Continue Astelin 1 spray twice daily. - Continue cetirizine 10mg  daily.  Adverse food reactions (peanuts, soy, shellfish) - Continue to avoid these foods. - EpiPen is up to date.   C. Diff infection - continue vancomycin course and prednisone as previously prescribed.  Prednisone should inevitably help with her sinus symptoms as well as her breathing.  It is reassuring that she had a negative checks x-ray  Return in about 3-4 months or sooner if needed  I appreciate the opportunity to take part in Briseis's care. Please do not hesitate to contact me with questions.  Sincerely,   Prudy Feeler, MD Allergy/Immunology [facility]

## 2018-02-12 NOTE — Patient Instructions (Addendum)
Chronic Rhinosinusitis  - ongoing 3 months of sinus congestion, HA despite appropriate antibiotic therapy for acute sinusitis - will obtain sinus CT to evaluate sinuses to determine if any chronic or acute change or sinus blockage - may continue Mucinex (or Mucinex DM) 600mg  1-2 times a day and drink with plenty of water to loosen/thin mucus - recommend nasal saline rinses  Moderate persistent asthma - recent exacerbation in past 3 months - Daily controller medication(s): Symbicort 160/4.5 two puffs twice daily with spacer - Rescue medications: ProAir 4 puffs every 4-6 hours as needed.   - Asthma control goals:  * Full participation in all desired activities (may need albuterol before activity) * Albuterol use two time or less a week on average (not counting use with activity) * Cough interfering with sleep two time or less a month * Oral steroids no more than once a year * No hospitalizations   Allergic rhinoconjunctivitis - Continue Astelin 1 spray twice daily. - Continue cetirizine 10mg  daily.  Adverse food reactions (peanuts, soy, shellfish) - Continue to avoid these foods. - EpiPen is up to date.   C. Diff infection - continue vancomycin course and prednisone as previously prescribed.   Return in about 3-4 months or sooner if needed

## 2018-02-17 ENCOUNTER — Telehealth: Payer: Self-pay

## 2018-02-17 NOTE — Telephone Encounter (Signed)
Hey, I am just following up to see if the patients order was done for the CT and scheduled.  Thanks

## 2018-02-17 NOTE — Telephone Encounter (Signed)
No it has not been scheduled. The order was entered and no prior Josem Kaufmann is necessary.

## 2018-02-18 NOTE — Telephone Encounter (Signed)
I called central scheduling and scheduled a CT scan for the pt for 03/04/18 @ 3:15pm @ [facility]. I let pt know time and date of appt.

## 2018-02-24 ENCOUNTER — Ambulatory Visit: Payer: Medicare Other | Admitting: Neurology

## 2018-02-24 ENCOUNTER — Encounter: Payer: Self-pay | Admitting: Neurology

## 2018-02-24 VITALS — BP 131/70 | HR 58 | Ht <= 58 in | Wt 157.0 lb

## 2018-02-24 DIAGNOSIS — Z79899 Other long term (current) drug therapy: Secondary | ICD-10-CM | POA: Diagnosis not present

## 2018-02-24 DIAGNOSIS — G7000 Myasthenia gravis without (acute) exacerbation: Secondary | ICD-10-CM

## 2018-02-24 MED ORDER — MYCOPHENOLATE MOFETIL 500 MG PO TABS: 1000.0000 mg | ORAL_TABLET | Freq: Two times a day (BID) | ORAL | 4 refills | Status: DC

## 2018-02-24 NOTE — Progress Notes (Signed)
[facility]    Provider:  Dr Nicole Holland Referring Provider: Helane Rima, MD Primary Care Physician:  Nicole Rima, MD  CC:  Myasthenia Gravis  Interval history: here for follow up on myasthenia Gravis. Doing well on Cellcept. No symptoms even when she was sick. No diplopia, no difficulty swallowing, continue cellcept.  Interval history; Doing well, no double vision, feels improved. No side effects to the medication. Taking medication regularly. No SOB, no double vision, no weakness.   Interval 03/12/2017:  Nicole Holland is a 82 y.o. female here as a referral from Dr. Lavone Holland for Myasthenia Gravis.ACHR binding antibody serum was positive. No double vision. Feels improved. She has finished her prednisone.  Will increase CellCept 1000 mg twice a day which is the preferred dose. Would prefer patient to come back in 3 months however she declines, so she does not want to come back for 6 months. We will do labs monthly until she sees me again. Discussed side effects again and asked her to please call our office if there is any side effects whatsoever.  Interval History 12/09/2016:  Nicole Holland is a 82 y.o. female here as a referral from Dr. Lavone Holland for Myasthenia Gravis.ACHR binding antibody serum was positive.  I had a long talk with her at last appointment however I don't the patient understands there may be memory problems. Asked her son to come along with her that she is here alone again today. I asked her to explain to me what is going on and she said "I really don't know what is going on". She had a long appointment with the ophthalmologist to discuss MG and also I spoke to her about MG but appears she has memory problems and a vague understanding.   [facility]  IEP:PIRJJ Nicole Harbisonis a 82 y.o.femalehere as a referral from Dr. Dorna Holland myogenic ptosis. She noticed eyelid drooping for a year. The eye symptoms don;t change with  time of day, not better in the morning or evening. Pollen makes it worse, night driving is hard. No significant improvement after a nap. The left eye is worse. It is stable. People think she is asleep when she is not actually asleep. No double vision. No difficulty swallowing, breathing, no voice changing. Pollen season has been bad. No voice changes. Her legs try to give out on her but that's been "going on forever" no new muscle or new difficulties lifting arms overhead or climbing stairs. She can comb and style hair, brush teeth, can get out of seats. No neck weakness.   Reviewed notes, labs and imaging from outside physicians, which showed:   Drooping of the right eye and left eye. [facility]'s [facility]. Eyelids have been drooping for the past year and getting worse, causing I was to feel heavy and rubs slightly as the eyes. She is not able to see as clearly as she used to. She feels is progressively getting worse. Eyes have been tearing and burning which makes vision blurry sometimes. She can't see as clear as she would like to due to her eyelids drooping down in her eyes very bothersome. In driving at night has become a problem. Reviewed exam which showed a 20/60 in 2040 distance 20/50 in 20/30 near, patient was not dilated, intraocular pressure is normal, pupils equally round and reactive to light no APD, open angles, extraocular movements intact, myogenic ptosis, anterior chamber is normal, adnexa normal. They were referred here for EMG. Myasthenia panel was  sent and a ACHR binding antibody serum was positive.   Reviewed labs: Receptor binding antibodies were elevated at 0.29 otherwise blocking antibodies and modulating antibodies were negative.  Review of Systems: Patient complains of symptoms per HPI as well as the following symptoms: no SOB, no CP. Pertinent negatives per HPI. All others negative.    Social History   Socioeconomic History  . Marital status: Married     Spouse name: separated, still married  . Number of children: 4  . Years of education: 47  . Highest education level: Not on file  Occupational History    Comment: NA  Social Needs  . Financial resource strain: Not on file  . Food insecurity:    Worry: Not on file    Inability: Not on file  . Transportation needs:    Medical: Not on file    Non-medical: Not on file  Tobacco Use  . Smoking status: Never Smoker  . Smokeless tobacco: Never Used  Substance and Sexual Activity  . Alcohol use: No  . Drug use: No  . Sexual activity: Never    Birth control/protection: Post-menopausal    Comment: mensus age 67,G5P4, 65st preg age 11, b.c short time,couldn't tolerate  Lifestyle  . Physical activity:    Days per week: Not on file    Minutes per session: Not on file  . Stress: Not on file  Relationships  . Social connections:    Talks on phone: Not on file    Gets together: Not on file    Attends religious service: Not on file    Active member of club or organization: Not on file    Attends meetings of clubs or organizations: Not on file    Relationship status: Not on file  . Intimate partner violence:    Fear of current or ex partner: Not on file    Emotionally abused: Not on file    Physically abused: Not on file    Forced sexual activity: Not on file  Other Topics Concern  . Not on file  Social History Narrative   Lives alone   Caffeine- none   Right handed    Family History  Problem Relation Age of Onset  . Stroke Mother   . Diabetes Mother   . Cancer Mother   . Cancer Father        Lung cancer  . Diabetes Sister   . Cancer Sister        Unknown Cancer  . Diabetes Brother   . Cancer Brother        Unknown cancer  . Diabetes Brother   . Cancer Brother        Unknown cancer  . Cancer Brother        Unknown Cancer  . Diabetes Brother   . Cancer Brother        Unknown Cancer  . Autoimmune disease Neg Hx     Past Medical History:  Diagnosis Date  .  Arthritis   . Asthma   . 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 C. difficile diarrhea 2019  . Gout   . Hyperlipidemia   . Hypertension   . S/P radiation therapy 10/28/11 - 12/02/11   Right Breast/ 5000cGy/ 25 Fractions  . Sinus infection 2019  . Use of tamoxifen (Nolvadex) 12/04/11   Dr. Truddie Coco  . Vitamin D deficiency     Past Surgical History:  Procedure Laterality Date  . ABDOMINAL HYSTERECTOMY  1994   partial ovaries intact  . BREAST LUMPECTOMY  08/02/11   Lumpectomy right Breast; Low Grade Ductal Carcinoma In Situ with Associated Calcifications, ER 65%, PR 99%  . BREAST SURGERY  02/01/2000   right breast subareolar core biopsy  . BREAST SURGERY  09/27/11   Right Breast; Excision of  Anterior Margin- Benign Breast Parenchyma, Negative for In situ and Invasive Cancer, Surgical Margins Negative, Benign skin, Negative for Malignancy  . CATARACT EXTRACTION, BILATERAL  2015, 2016  . CESAREAN SECTION    . TONSILLECTOMY      Current Outpatient Medications  Medication Sig Dispense Refill  . albuterol (PROAIR HFA) 108 (90 Base) MCG/ACT inhaler Inhale 2 puffs into the lungs every 6 (six) hours as needed for wheezing or shortness of breath. 1 Inhaler 1  . atorvastatin (LIPITOR) 20 MG tablet Take 20 mg by mouth at bedtime.  0  . azelastine (ASTELIN) 0.1 % nasal spray Place 1 spray into both nostrils 2 (two) times daily. Use in each nostril as directed    . budesonide-formoterol (SYMBICORT) 160-4.5 MCG/ACT inhaler Inhale 2 puffs into the lungs as needed. 1 Inhaler 5  . carvedilol (COREG) 6.25 MG tablet Take 12.5 mg by mouth 2 (two) times daily.  0  . diphenhydrAMINE (BENADRYL) 25 mg capsule Take by mouth.    . furosemide (LASIX) 40 MG tablet Take 40 mg by mouth daily.  0  . guaiFENesin (MUCINEX) 600 MG 12 hr tablet Take 600 mg by mouth as needed.     . Lactobacillus (PROBIOTIC ACIDOPHILUS PO) Take by mouth.    . losartan (COZAAR) 25 MG tablet  Take 25 mg by mouth daily.  0  . mometasone (NASONEX) 50 MCG/ACT nasal spray Place 2 sprays into the nose daily. 17 g 5  . Multiple Vitamins-Minerals (CENTRUM SILVER PO) Take 1 tablet by mouth daily.     . mycophenolate (CELLCEPT) 500 MG tablet Take 2 tablets (1,000 mg total) by mouth 2 (two) times daily. 360 tablet 4  . olopatadine (PATANOL) 0.1 % ophthalmic solution instill 1 drop into both eyes daily 5 mL 5  . Olopatadine HCl (PATADAY) 0.2 % SOLN Apply 1 drop to eye as needed (itching).     . Omega-3 Fatty Acids (FISH OIL) 1200 MG CAPS Take 1,200 mg by mouth 2 (two) times daily.     Glory Rosebush DELICA LANCETS 10G MISC TEST ONCE DAILY AS DIRECTED    . Polyethylene Glycol 400 (BLINK TEARS) 0.25 % SOLN Apply 1 drop to eye as needed (dry eyes).     . vitamin C (ASCORBIC ACID) 500 MG tablet Take 500 mg by mouth daily.      Marland Kitchen EPINEPHrine (EPIPEN 2-PAK) 0.3 mg/0.3 mL IJ SOAJ injection     . glucose blood (ONE TOUCH ULTRA TEST) test strip TEST once daily    . loperamide (IMODIUM) 1 MG/5ML solution Take by mouth.     No current facility-administered medications for this visit.     Allergies as of 02/24/2018 - Review Complete 02/24/2018  Allergen Reaction Noted  . Iodides Itching 10/25/2010  . Peanut oil Other (See Comments) 01/04/2016  . Shellfish allergy Other (See Comments) 01/04/2016  . Soybean-containing drug products Other (See Comments) 01/04/2016  . Vaccinium angustifolium Other (See Comments) 01/04/2016  . Mestinon [pyridostigmine bromide] Diarrhea 12/06/2016  . Penicillins Itching 10/25/2010    Vitals: BP 131/70 (BP Location: Right Arm, Patient Position: Sitting)   Pulse (!) 58  Ht 4\' 10"  (1.473 Nicole)   Wt 157 lb (71.2 kg)   BMI 32.81 kg/Nicole  Last Weight:  Wt Readings from Last 1 Encounters:  02/24/18 157 lb (71.2 kg)   Last Height:   Ht Readings from Last 1 Encounters:  02/24/18 4\' 10"  (1.473 Nicole)     Exam: Gen: NAD, conversant, well nourised, obese, well groomed   CV: RRR, +SEM. No Carotid Bruits. No peripheral edema, warm, nontender Eyes: Conjunctivae clear without exudates or hemorrhage  Neuro: Detailed Neurologic Exam  Speech: Speech is normal; fluent and spontaneous with normal comprehension.  Cognition: The patient is oriented to person, place, and time;  recent and remote memory intact;  language fluent;  normal attention, concentration,  fund of knowledge Cranial Nerves: The pupils are equal, round, and reactive to light. Attempted fundoscopic exam could not visualize. Visual fields are full to finger confrontation. Impaired left upgaze with fatiguability. Trigeminal sensation is intact and the muscles of mastication are normal. Left> right ptosis. Left to mid pupil right just above pupil.The palate elevates in the midline. Hearing intact. Voice is normal. Shoulder shrug is normal. The tongue has normal motion without fasciculations.   Coordination: No dysmetria  Gait: Can get on heels and toes, gait mild waddling  Motor Observation: can get out of chair without using hands. no involuntary movements noted. Tone: Normal muscle tone.   Posture: Posture is normal. normal erect  Strength: Improved, mild weakness proximal left arm otherwise appears intact and symmetric.  Otherwise intact   Sensation: intact to LT  Reflex Exam:  DTR's: Deep tendon reflexes in the upper and lower extremities are brisk bilaterally.  Toes: The toes are downgoing bilaterally.  Clonus: Clonus is absent.  +ice pack test      Assessment/Plan:  82 year old with seropositive myasthenia gravis, acetylcholine receptor binding antibody positive, doing well  - Will hold off on emg/ncs, don't think we need an emg/ncs to diagnose myasthenia gravis in this case -  Did not tolerate Mestinon 30mg  3x a day, doing well on cellcept, increased strength, no symptoms of MG - CT  of the chest to eval for thymoma: Negative - Continue CellCept 1000 mg twice daily.  Discussed side effects again, call for any side effects at all. We'll continue lab testing every 3-4 months until she sees me.   Continue Cellcept, discussed side effects of patient.. Likely takes at least 6 months and perhaps longer to produce significant benefit. There is evidence that mycophenolate has a more rapid onset of clinical effect.The medicine comes in 250 mg and 500 mg tablets. The standard dose is 1000 mg twice a day. Some people need a higher dose of 1500 mg twice a day. The primary side effect that you might notice is gastrointestinal upset. To help overcome this, you should begin the medicine at one 250 mg tablet twice a day for the first five days and increase by adding one tablet morning and evening every five days until you get to the total dose of four tablets twice a day. Labs weekly for 4 weeks, then biweekly for 4 weeks and monthly afterwards. Discussed all side effects as per patient instructions and above including risk of rare brain disease calledPML.  Again, Discussed that shouldshe have acute worsening especially shortness of breath patient should call 911 and be transported to the emergency room due to risks for myasthenia gravis exacerbation and respiratory failure.   Sarina Ill, MD  [facility] [address] [place], [place]  [address]  Phone [phone] Fax [phone]  A total of 15 minutes was spent face-to-face with this patient. Over half this time was spent on counseling patient on the myasthenia gravis diagnosis and different diagnostic and therapeutic options available.

## 2018-02-25 ENCOUNTER — Ambulatory Visit (HOSPITAL_COMMUNITY): Payer: Medicare Other

## 2018-02-26 ENCOUNTER — Ambulatory Visit: Payer: Medicare Other | Admitting: Nurse Practitioner

## 2018-02-26 ENCOUNTER — Encounter: Payer: Self-pay | Admitting: Nurse Practitioner

## 2018-02-26 VITALS — BP 108/60 | HR 66 | Ht <= 58 in | Wt 166.0 lb

## 2018-02-26 DIAGNOSIS — R197 Diarrhea, unspecified: Secondary | ICD-10-CM

## 2018-02-26 DIAGNOSIS — A0472 Enterocolitis due to Clostridium difficile, not specified as recurrent: Secondary | ICD-10-CM

## 2018-02-26 NOTE — Patient Instructions (Signed)
If you are age 80 or older, your body mass index should be between 23-30. Your Body mass index is 34.69 kg/m. If this is out of the aforementioned range listed, please consider follow up with your Primary Care Provider.  If you are age 56 or younger, your body mass index should be between 19-25. Your Body mass index is 34.69 kg/m. If this is out of the aformentioned range listed, please consider follow up with your Primary Care Provider.   Call if recurrent diarrhea.  Thank you for choosing me and [facility].   Tye Savoy, NP

## 2018-02-26 NOTE — Progress Notes (Signed)
GI Provider: new patient             Chief Complaint: diarrhea  Referring Provider:   Kern Reap, NP     ASSESSMENT AND PLAN;   64. 82 year old female with recent acute diarrhea.  Referred to [place] for evaluation but in the interim was seen at  urgent care, tested positive for C. difficile.  We contacted pharmacy as patient said she was treated for a bowel infection and in fact she was treated with 10 days of oral Vancomycin. Diarrhea has totally resolved.  Abdominal exam unremarkable. Patient feels fine.  -Advised patient to call should she have a recurrence of diarrhea.  She is certainly at risk for recurrent C. difficile infection given sinus problems which may necessitate use of antibiotics at some point  2.  Colon cancer screening.  Patient has never had a colonoscopy.  Given her advanced age I did not offer screening colonoscopy to her.    HPI:    Patient is an 82 year old female with myasthenia gravis , chronic normocytic anemia , CKD, asthma, history of breast cancer, hypertension, and gout . She is referred here for watery diarrhea.  Patient was seen by PCP 02/05/2018 for acute watery diarrhea.  She was encouraged to stay well-hydrated and follow the brat diet .  After failing to improve patient was subsequently seen at Urgent Care a few days later.   Of note she had been on Clindomycin in late July. A GI pathogen panel was ordered, plain abdominal film obtained (unremarkable) and GI referral made. As it turns out, C. difficile toxin was positive, she was prescribed and completed 10 days of PO Vancomycin.  The diarrhea has completely resolved.  Her bowels are back to being solid.  She has no abdominal pain, never did have any associated fevers.  Her appetite is fine.  She still having some sinus problems and is apparently scheduled for CT of the sinus soon  Data reviewed: Labs 822 pertinent for creatinine 1.56, GFR 36 Liver test June 2019 normal  Past Medical History:    Diagnosis Date  . Arthritis   . Asthma   . 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 C. difficile diarrhea 2019  . Gout   . Hyperlipidemia   . Hypertension   . S/P radiation therapy 10/28/11 - 12/02/11   Right Breast/ 5000cGy/ 25 Fractions  . Sinus infection 2019  . Use of tamoxifen (Nolvadex) 12/04/11   Dr. Truddie Coco  . Vitamin D deficiency      Past Surgical History:  Procedure Laterality Date  . ABDOMINAL HYSTERECTOMY  1994   partial ovaries intact  . BREAST LUMPECTOMY  08/02/11   Lumpectomy right Breast; Low Grade Ductal Carcinoma In Situ with Associated Calcifications, ER 65%, PR 99%  . BREAST SURGERY  02/01/2000   right breast subareolar core biopsy  . BREAST SURGERY  09/27/11   Right Breast; Excision of  Anterior Margin- Benign Breast Parenchyma, Negative for In situ and Invasive Cancer, Surgical Margins Negative, Benign skin, Negative for Malignancy  . CATARACT EXTRACTION, BILATERAL  2015, 2016  . CESAREAN SECTION    . TONSILLECTOMY     Family History  Problem Relation Age of Onset  . Stroke Mother   . Diabetes Mother   . Cancer Mother   . Cancer Father        Lung cancer  . Diabetes Sister   . Cancer  Sister        Unknown Cancer  . Diabetes Brother   . Cancer Brother        Unknown cancer  . Diabetes Brother   . Cancer Brother        Unknown cancer  . Cancer Brother        Unknown Cancer  . Diabetes Brother   . Cancer Brother        Unknown Cancer  . Autoimmune disease Neg Hx    Social History   Tobacco Use  . Smoking status: Never Smoker  . Smokeless tobacco: Never Used  Substance Use Topics  . Alcohol use: No  . Drug use: No   Current Outpatient Medications  Medication Sig Dispense Refill  . albuterol (PROAIR HFA) 108 (90 Base) MCG/ACT inhaler Inhale 2 puffs into the lungs every 6 (six) hours as needed for wheezing or shortness of breath. 1 Inhaler 1  . atorvastatin (LIPITOR) 20 MG tablet Take 20 mg by  mouth at bedtime.  0  . azelastine (ASTELIN) 0.1 % nasal spray Place 1 spray into both nostrils 2 (two) times daily. Use in each nostril as directed    . budesonide-formoterol (SYMBICORT) 160-4.5 MCG/ACT inhaler Inhale 2 puffs into the lungs as needed. 1 Inhaler 5  . carvedilol (COREG) 6.25 MG tablet Take 12.5 mg by mouth 2 (two) times daily.  0  . diphenhydrAMINE (BENADRYL) 25 mg capsule Take by mouth.    . EPINEPHrine (EPIPEN 2-PAK) 0.3 mg/0.3 mL IJ SOAJ injection     . furosemide (LASIX) 40 MG tablet Take 40 mg by mouth daily.  0  . glucose blood (ONE TOUCH ULTRA TEST) test strip TEST once daily    . guaiFENesin (MUCINEX) 600 MG 12 hr tablet Take 600 mg by mouth as needed.     . Lactobacillus (PROBIOTIC ACIDOPHILUS PO) Take by mouth.    . losartan (COZAAR) 25 MG tablet Take 25 mg by mouth daily.  0  . mometasone (NASONEX) 50 MCG/ACT nasal spray Place 2 sprays into the nose daily. 17 g 5  . Multiple Vitamins-Minerals (CENTRUM SILVER PO) Take 1 tablet by mouth daily.     . mycophenolate (CELLCEPT) 500 MG tablet Take 2 tablets (1,000 mg total) by mouth 2 (two) times daily. 360 tablet 4  . olopatadine (PATANOL) 0.1 % ophthalmic solution instill 1 drop into both eyes daily 5 mL 5  . Olopatadine HCl (PATADAY) 0.2 % SOLN Apply 1 drop to eye as needed (itching).     . Omega-3 Fatty Acids (FISH OIL) 1200 MG CAPS Take 1,200 mg by mouth 2 (two) times daily.     Glory Rosebush DELICA LANCETS 63K MISC TEST ONCE DAILY AS DIRECTED    . Polyethylene Glycol 400 (BLINK TEARS) 0.25 % SOLN Apply 1 drop to eye as needed (dry eyes).     . vitamin C (ASCORBIC ACID) 500 MG tablet Take 500 mg by mouth daily.       No current facility-administered medications for this visit.    Allergies  Allergen Reactions  . Iodides Itching    TOTAL BODY  . Peanut Oil Other (See Comments)    Recent allergy testing  . Shellfish Allergy Other (See Comments)    Per allergy testing  . Soybean-Containing Drug Products Other (See  Comments)    Per allergy testing  . Vaccinium Angustifolium Other (See Comments)    Per allergy testing  . Mestinon [Pyridostigmine Bromide] Diarrhea    "Stomach cramps, blood  in stool"  . Penicillins Itching    Has patient had a PCN reaction causing immediate rash, facial/tongue/throat swelling, SOB or lightheadedness with hypotension: no-- pt did not have rash but was itchy all over body Has patient had a PCN reaction causing severe rash involving mucus membranes or skin necrosis: no Has patient had a PCN reaction that required hospitalization no Has patient had a PCN reaction occurring within the last 10 years: no If all of the above answers are "NO", then may proceed with Cephalosporin use.       Review of Systems: Positive for allergy, arthritis, back pain, cough, fatigue, shortness of breath.  All other systems reviewed and negative except where noted in HPI.   Creatinine clearance cannot be calculated (Patient's most recent lab result is older than the maximum 21 days allowed.)   Physical Exam:    Wt Readings from Last 3 Encounters:  02/26/18 166 lb (75.3 kg)  02/24/18 157 lb (71.2 kg)  01/08/18 176 lb 9.6 oz (80.1 kg)    BP 108/60   Pulse 66   Ht 4\' 10"  (1.473 m)   Wt 166 lb (75.3 kg)   BMI 34.69 kg/m  Constitutional:  Pleasant female in no acute distress. Psychiatric: Normal mood and affect. Behavior is normal. EENT: Pupils normal.  Conjunctivae are normal. No scleral icterus. Neck supple.  Cardiovascular: Normal rate, regular rhythm.  Loud murmur is present , pitting edema of bilateral lower extremities Pulmonary/chest: Effort normal and breath sounds normal. No wheezing, rales or rhonchi. Abdominal: Soft, nondistended, nontender. Bowel sounds active throughout. There are no masses palpable. No hepatomegaly. Neurological: Alert and oriented to person place and time. Skin: Skin is warm and dry. No rashes noted.  Tye Savoy, NP  02/26/2018, 11:47  AM  Cc: Kern Reap, NP

## 2018-02-27 ENCOUNTER — Encounter: Payer: Self-pay | Admitting: Nurse Practitioner

## 2018-03-02 NOTE — Progress Notes (Signed)
Reviewed and agree with documentation and assessment and plan. K. Veena Nandigam , MD   

## 2018-03-04 ENCOUNTER — Ambulatory Visit (HOSPITAL_COMMUNITY)
Admission: RE | Admit: 2018-03-04 | Discharge: 2018-03-04 | Disposition: A | Discharge status: Home / Self Care | Payer: Medicare Other | Source: Ambulatory Visit | Attending: Allergy | Admitting: Allergy

## 2018-03-04 DIAGNOSIS — J320 Chronic maxillary sinusitis: Secondary | ICD-10-CM | POA: Diagnosis not present

## 2018-03-04 DIAGNOSIS — B9689 Other specified bacterial agents as the cause of diseases classified elsewhere: Secondary | ICD-10-CM | POA: Diagnosis not present

## 2018-03-04 DIAGNOSIS — J329 Chronic sinusitis, unspecified: Secondary | ICD-10-CM | POA: Diagnosis present

## 2018-04-08 ENCOUNTER — Ambulatory Visit: Payer: Medicare Other | Admitting: Allergy

## 2018-04-23 ENCOUNTER — Ambulatory Visit: Payer: Medicare Other | Admitting: Allergy

## 2018-04-23 ENCOUNTER — Encounter: Payer: Self-pay | Admitting: Allergy

## 2018-04-23 VITALS — BP 124/72 | HR 70 | Resp 16

## 2018-04-23 DIAGNOSIS — T781XXD Other adverse food reactions, not elsewhere classified, subsequent encounter: Secondary | ICD-10-CM

## 2018-04-23 DIAGNOSIS — J4540 Moderate persistent asthma, uncomplicated: Secondary | ICD-10-CM | POA: Diagnosis not present

## 2018-04-23 DIAGNOSIS — J328 Other chronic sinusitis: Secondary | ICD-10-CM | POA: Diagnosis not present

## 2018-04-23 DIAGNOSIS — J309 Allergic rhinitis, unspecified: Secondary | ICD-10-CM

## 2018-04-23 DIAGNOSIS — H1010 Acute atopic conjunctivitis, unspecified eye: Secondary | ICD-10-CM

## 2018-04-23 NOTE — Progress Notes (Signed)
Follow-up Note  RE: Nicole Holland MRN: [id number] DOB: June 14, 1936 Date of Office Visit: 04/23/2018   History of present illness: Nicole Holland is a 82 y.o. female presenting today for follow-up of chronic rhinosinusitis and asthma.  She also has a history of food allergy.  She was last seen in the office on February 12, 2018 at which time she was battling a C. difficile infection on vancomycin.  This has resolved. She states that she has been doing well since her last visit.  She states that she has been able to get back to exercising she also started drinking boost supplements for women.  She states that she still has a bit of a cough but it is much improved.  She takes Symbicort 2 puffs twice a day.  She denies any significant albuterol use since her last visit.  She denies any ED or urgent care visits or any additional oral steroid needs. Her sinus congestion has also improved.  She is using as a lasting as well as cetirizine on most days.  She also states she has been having some itchy and watery eyes and she has seen her eye doctor recently who prescribes her eyedrops.  She is not sure the name of these drops however. She does continue to avoid peanuts soy and shellfish.  She does have an current EpiPen. She has received her flu shot in September states that she still feels some soreness in her left arm.  She states she continues to move the arm about without any issue.  Review of systems: Review of Systems  Constitutional: Negative for chills, fever and malaise/fatigue.  HENT: Positive for congestion. Negative for ear discharge, ear pain, nosebleeds, sinus pain and sore throat.   Eyes: Negative for pain, discharge and redness.  Respiratory: Positive for cough. Negative for sputum production, shortness of breath and wheezing.   Cardiovascular: Negative for chest pain.  Gastrointestinal: Negative for abdominal pain, constipation, diarrhea, heartburn, nausea and vomiting.    Musculoskeletal: Negative for joint pain.  Skin: Negative for itching and rash.  Neurological: Negative for headaches.    All other systems negative unless noted above in HPI  Past medical/social/surgical/family history have been reviewed and are unchanged unless specifically indicated below.  No changes  Medication List: Allergies as of 04/23/2018      Reactions   Iodides Itching   TOTAL BODY   Peanut Oil Other (See Comments)   Recent allergy testing   Shellfish Allergy Other (See Comments)   Per allergy testing   Soybean-containing Drug Products Other (See Comments)   Per allergy testing   Vaccinium Angustifolium Other (See Comments)   Per allergy testing   Mestinon [pyridostigmine Bromide] Diarrhea   "Stomach cramps, blood in stool"   Penicillins Itching   Has patient had a PCN reaction causing immediate rash, facial/tongue/throat swelling, SOB or lightheadedness with hypotension: no-- pt did not have rash but was itchy all over body Has patient had a PCN reaction causing severe rash involving mucus membranes or skin necrosis: no Has patient had a PCN reaction that required hospitalization no Has patient had a PCN reaction occurring within the last 10 years: no If all of the above answers are "NO", then may proceed with Cephalosporin use.      Medication List        Accurate as of 04/23/18 12:21 PM. Always use your most recent med list.          albuterol 108 (90 Base)  MCG/ACT inhaler Commonly known as:  PROVENTIL HFA;VENTOLIN HFA Inhale 2 puffs into the lungs every 6 (six) hours as needed for wheezing or shortness of breath.   atorvastatin 20 MG tablet Commonly known as:  LIPITOR Take 20 mg by mouth at bedtime.   azelastine 0.1 % nasal spray Commonly known as:  ASTELIN Place 1 spray into both nostrils 2 (two) times daily. Use in each nostril as directed   BLINK TEARS 0.25 % Soln Generic drug:  Polyethylene Glycol 400 Apply 1 drop to eye as needed (dry  eyes).   budesonide-formoterol 160-4.5 MCG/ACT inhaler Commonly known as:  SYMBICORT Inhale 2 puffs into the lungs as needed.   carvedilol 6.25 MG tablet Commonly known as:  COREG Take 12.5 mg by mouth 2 (two) times daily.   CENTRUM SILVER PO Take 1 tablet by mouth daily.   diphenhydrAMINE 25 mg capsule Commonly known as:  BENADRYL Take by mouth.   EPIPEN 2-PAK 0.3 mg/0.3 mL Soaj injection Generic drug:  EPINEPHrine   Fish Oil 1200 MG Caps Take 1,200 mg by mouth 2 (two) times daily.   furosemide 40 MG tablet Commonly known as:  LASIX Take 40 mg by mouth daily.   guaiFENesin 600 MG 12 hr tablet Commonly known as:  MUCINEX Take 600 mg by mouth as needed.   losartan 25 MG tablet Commonly known as:  COZAAR Take 25 mg by mouth daily.   mometasone 50 MCG/ACT nasal spray Commonly known as:  NASONEX Place 2 sprays into the nose daily.   mycophenolate 500 MG tablet Commonly known as:  CELLCEPT Take 2 tablets (1,000 mg total) by mouth 2 (two) times daily.   ONE TOUCH ULTRA TEST test strip Generic drug:  glucose blood TEST once daily   ONETOUCH DELICA LANCETS 43X Misc TEST ONCE DAILY AS DIRECTED   PATADAY 0.2 % Soln Generic drug:  Olopatadine HCl Apply 1 drop to eye as needed (itching).   olopatadine 0.1 % ophthalmic solution Commonly known as:  PATANOL instill 1 drop into both eyes daily   PROBIOTIC ACIDOPHILUS PO Take by mouth.   vitamin C 500 MG tablet Commonly known as:  ASCORBIC ACID Take 500 mg by mouth daily.       Known medication allergies: Allergies  Allergen Reactions  . Iodides Itching    TOTAL BODY  . Peanut Oil Other (See Comments)    Recent allergy testing  . Shellfish Allergy Other (See Comments)    Per allergy testing  . Soybean-Containing Drug Products Other (See Comments)    Per allergy testing  . Vaccinium Angustifolium Other (See Comments)    Per allergy testing  . Mestinon [Pyridostigmine Bromide] Diarrhea    "Stomach  cramps, blood in stool"  . Penicillins Itching    Has patient had a PCN reaction causing immediate rash, facial/tongue/throat swelling, SOB or lightheadedness with hypotension: no-- pt did not have rash but was itchy all over body Has patient had a PCN reaction causing severe rash involving mucus membranes or skin necrosis: no Has patient had a PCN reaction that required hospitalization no Has patient had a PCN reaction occurring within the last 10 years: no If all of the above answers are "NO", then may proceed with Cephalosporin use.       Physical examination: Blood pressure 124/72, pulse 70, resp. rate 16, SpO2 98 %.  General: Alert, interactive, in no acute distress. HEENT: PERRLA, TMs pearly gray, turbinates mildly edematous with clear discharge, post-pharynx non erythematous. Neck: Supple without  lymphadenopathy. Lungs: Clear to auscultation without wheezing, rhonchi or rales. {no increased work of breathing. CV: Normal S1, S2 without murmurs. Abdomen: Nondistended, nontender. Skin: Warm and dry, without lesions or rashes. Extremities:  No clubbing, cyanosis or edema. Neuro:   Grossly intact.  Diagnositics/Labs:  Spirometry: FEV1: 0.92L 88%, FVC: 1.39L 101%, ratio consistent with nonobstructive pattenr  Assessment and plan: Chronic Rhinosinusitis  - symptoms are improved.  - CT sinus showed hypoplastic frontal sinus otherwise no evidence of sinusitis - as needed use of Mucinex (or Mucinex DM) 600mg  1-2 times a day and drink with plenty of water to loosen/thin mucus - recommend nasal saline rinses  Moderate persistent asthma - improved.  Lung function testing today is normal! - Daily controller medication(s): Symbicort 160/4.5 two puffs twice daily with spacer - Rescue medications: ProAir 4 puffs every 4-6 hours as needed.   - Asthma control goals:  * Full participation in all desired activities (may need albuterol before activity) * Albuterol use two time or less a  week on average (not counting use with activity) * Cough interfering with sleep two time or less a month * Oral steroids no more than once a year * No hospitalizations   Allergic rhinoconjunctivitis - Continue Astelin 1 spray twice daily. - Continue cetirizine 10mg  daily.  Adverse food reactions (peanuts, soy, shellfish) - Continue to avoid these foods. - EpiPen is up to date.    Return in about 3-4 months or sooner if needed   I appreciate the opportunity to take part in Archana's care. Please do not hesitate to contact me with questions.  Sincerely,   Prudy Feeler, MD Allergy/Immunology [facility] [place]

## 2018-04-23 NOTE — Patient Instructions (Addendum)
Chronic Rhinosinusitis  - symptoms are improved.  - CT sinus showed hypoplastic frontal sinus otherwise no evidence of sinusitis - as needed use of Mucinex (or Mucinex DM) 600mg  1-2 times a day and drink with plenty of water to loosen/thin mucus - recommend nasal saline rinses  Moderate persistent asthma - improved.  Lung function testing today is normal! - Daily controller medication(s): Symbicort 160/4.5 two puffs twice daily with spacer - Rescue medications: ProAir 4 puffs every 4-6 hours as needed.   - Asthma control goals:  * Full participation in all desired activities (may need albuterol before activity) * Albuterol use two time or less a week on average (not counting use with activity) * Cough interfering with sleep two time or less a month * Oral steroids no more than once a year * No hospitalizations   Allergic rhinoconjunctivitis - Continue Astelin 1 spray twice daily. - Continue cetirizine 10mg  daily.  Adverse food reactions (peanuts, soy, shellfish) - Continue to avoid these foods. - EpiPen is up to date.    Return in about 3-4 months or sooner if needed

## 2018-07-30 ENCOUNTER — Ambulatory Visit: Payer: Medicare Other | Admitting: Allergy

## 2018-07-30 ENCOUNTER — Encounter: Payer: Self-pay | Admitting: Allergy

## 2018-07-30 VITALS — BP 134/66 | HR 79 | Resp 16 | Ht 59.0 in | Wt 165.0 lb

## 2018-07-30 DIAGNOSIS — J4540 Moderate persistent asthma, uncomplicated: Secondary | ICD-10-CM | POA: Diagnosis not present

## 2018-07-30 DIAGNOSIS — T7800XD Anaphylactic reaction due to unspecified food, subsequent encounter: Secondary | ICD-10-CM

## 2018-07-30 DIAGNOSIS — J3089 Other allergic rhinitis: Secondary | ICD-10-CM

## 2018-07-30 DIAGNOSIS — J328 Other chronic sinusitis: Secondary | ICD-10-CM | POA: Diagnosis not present

## 2018-07-30 MED ORDER — ALBUTEROL SULFATE HFA 108 (90 BASE) MCG/ACT IN AERS: 2.0000 | INHALATION_SPRAY | Freq: Four times a day (QID) | RESPIRATORY_TRACT | 1 refills | Status: DC | PRN

## 2018-07-30 MED ORDER — AZELASTINE HCL 0.1 % NA SOLN: 1.0000 | Freq: Two times a day (BID) | NASAL | 5 refills | Status: DC

## 2018-07-30 MED ORDER — BUDESONIDE-FORMOTEROL FUMARATE 160-4.5 MCG/ACT IN AERO: 2.0000 | INHALATION_SPRAY | RESPIRATORY_TRACT | 5 refills | Status: DC | PRN

## 2018-07-30 NOTE — Addendum Note (Signed)
Addended by: Lytle Michaels A on: 07/30/2018 06:04 PM   Modules accepted: Orders

## 2018-07-30 NOTE — Progress Notes (Signed)
Follow-up Note  RE: Nicole Holland MRN: [id number] DOB: 02/26/1936 Date of Office Visit: 07/30/2018   History of present illness: Nicole Holland is a 83 y.o. female presenting today for follow-up of chronic rhinosinusitis with allergic rhinitis and asthma.  She was last seen in the office on April 23, 2018 by myself.  She has since changed her PCP to Tereasa Coop, [facility].  She denies any major health changes, surgeries or hospitalizations since her last visit.  She states she has been doing well.  She has not had any recurrence of sinusitis or need for antibiotic use.  She does state that she continues on her probiotic as well as will take Mucinex as needed for drainage and congestion.  She also continues to take her Zyrtec daily and use her Astelin twice a day.  In regards to her asthma she is taking her Symbicort 160 mcg using 2 puffs twice a day and she states she may need to use her albuterol 1-2 times a month on average.  She denies any oral steroid needs for asthma and no asthma flares requiring ED or urgent care visit since her last visit. She continues to avoid shellfish, soy and peanuts and has not had any accidental ingestions or need to use her epinephrine device.  Review of systems: Review of Systems  Constitutional: Negative for chills, fever and malaise/fatigue.  HENT: Negative for congestion, ear discharge, nosebleeds and sore throat.   Eyes: Negative for pain, discharge and redness.  Respiratory: Negative for cough, shortness of breath and wheezing.   Cardiovascular: Negative for chest pain.  Gastrointestinal: Negative for abdominal pain, constipation, diarrhea, heartburn, nausea and vomiting.  Musculoskeletal: Negative for joint pain.  Skin: Negative for itching and rash.  Neurological: Negative for headaches.    All other systems negative unless noted above in HPI  Past medical/social/surgical/family history have been reviewed and are unchanged unless  specifically indicated below.  No changes  Medication List: Allergies as of 07/30/2018      Reactions   Iodides Itching   TOTAL BODY   Peanut Oil Other (See Comments)   Recent allergy testing   Shellfish Allergy Other (See Comments)   Per allergy testing   Soybean-containing Drug Products Other (See Comments)   Per allergy testing   Vaccinium Angustifolium Other (See Comments)   Per allergy testing   Mestinon [pyridostigmine Bromide] Diarrhea   "Stomach cramps, blood in stool"   Penicillins Itching   Has patient had a PCN reaction causing immediate rash, facial/tongue/throat swelling, SOB or lightheadedness with hypotension: no-- pt did not have rash but was itchy all over body Has patient had a PCN reaction causing severe rash involving mucus membranes or skin necrosis: no Has patient had a PCN reaction that required hospitalization no Has patient had a PCN reaction occurring within the last 10 years: no If all of the above answers are "NO", then may proceed with Cephalosporin use.      Medication List       Accurate as of July 30, 2018  1:30 PM. Always use your most recent med list.        albuterol 108 (90 Base) MCG/ACT inhaler Commonly known as:  PROAIR HFA Inhale 2 puffs into the lungs every 6 (six) hours as needed for wheezing or shortness of breath.   atorvastatin 20 MG tablet Commonly known as:  LIPITOR Take 20 mg by mouth at bedtime.   azelastine 0.1 % nasal spray Commonly known  as:  ASTELIN Place 1 spray into both nostrils 2 (two) times daily. Use in each nostril as directed   BLINK TEARS 0.25 % Soln Generic drug:  Polyethylene Glycol 400 Apply 1 drop to eye as needed (dry eyes).   budesonide-formoterol 160-4.5 MCG/ACT inhaler Commonly known as:  SYMBICORT Inhale 2 puffs into the lungs as needed.   carvedilol 6.25 MG tablet Commonly known as:  COREG Take 12.5 mg by mouth 2 (two) times daily.   CENTRUM SILVER PO Take 1 tablet by mouth daily.     cetirizine 10 MG tablet Commonly known as:  ZYRTEC TK 1 T PO QD   diphenhydrAMINE 25 mg capsule Commonly known as:  BENADRYL Take by mouth.   EPIPEN 2-PAK 0.3 mg/0.3 mL Soaj injection Generic drug:  EPINEPHrine   Fish Oil 1200 MG Caps Take 1,200 mg by mouth 2 (two) times daily.   furosemide 40 MG tablet Commonly known as:  LASIX Take 40 mg by mouth daily.   guaiFENesin 600 MG 12 hr tablet Commonly known as:  MUCINEX Take 600 mg by mouth as needed.   losartan 25 MG tablet Commonly known as:  COZAAR Take 25 mg by mouth daily.   mometasone 50 MCG/ACT nasal spray Commonly known as:  NASONEX Place 2 sprays into the nose daily.   mycophenolate 500 MG tablet Commonly known as:  CELLCEPT Take 2 tablets (1,000 mg total) by mouth 2 (two) times daily.   ONE TOUCH ULTRA TEST test strip Generic drug:  glucose blood TEST once daily   ONETOUCH DELICA LANCETS 25K Misc TEST ONCE DAILY AS DIRECTED   PATADAY 0.2 % Soln Generic drug:  Olopatadine HCl Apply 1 drop to eye as needed (itching).   olopatadine 0.1 % ophthalmic solution Commonly known as:  PATANOL instill 1 drop into both eyes daily   PROBIOTIC ACIDOPHILUS PO Take by mouth.   vitamin C 500 MG tablet Commonly known as:  ASCORBIC ACID Take 500 mg by mouth daily.       Known medication allergies: Allergies  Allergen Reactions  . Iodides Itching    TOTAL BODY  . Peanut Oil Other (See Comments)    Recent allergy testing  . Shellfish Allergy Other (See Comments)    Per allergy testing  . Soybean-Containing Drug Products Other (See Comments)    Per allergy testing  . Vaccinium Angustifolium Other (See Comments)    Per allergy testing  . Mestinon [Pyridostigmine Bromide] Diarrhea    "Stomach cramps, blood in stool"  . Penicillins Itching    Has patient had a PCN reaction causing immediate rash, facial/tongue/throat swelling, SOB or lightheadedness with hypotension: no-- pt did not have rash but was itchy all  over body Has patient had a PCN reaction causing severe rash involving mucus membranes or skin necrosis: no Has patient had a PCN reaction that required hospitalization no Has patient had a PCN reaction occurring within the last 10 years: no If all of the above answers are "NO", then may proceed with Cephalosporin use.       Physical examination: Blood pressure 134/66, pulse 79, resp. rate 16, height 4\' 11"  (1.499 m), weight 165 lb (74.8 kg), SpO2 98 %.  General: Alert, interactive, in no acute distress. HEENT: PERRLA, TMs pearly gray, turbinates minimally edematous without discharge, post-pharynx non erythematous. Neck: Supple without lymphadenopathy. Lungs: Clear to auscultation without wheezing, rhonchi or rales. {no increased work of breathing. CV: Normal S1, S2 without murmurs. Abdomen: Nondistended, nontender. Skin: Warm and dry,  without lesions or rashes. Extremities:  No clubbing, cyanosis or edema. Neuro:   Grossly intact.  Diagnositics/Labs:  Spirometry: FEV1: 1.01L 73%, FVC: 1.42L 79%, ratio consistent with Nonobstructive pattern for age  Assessment and plan:   Chronic Rhinosinusitis  - symptoms are improved.  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  Lung function testing today is normal! - Daily controller medication(s): Symbicort 160/4.5 two puffs twice daily with spacer - Rescue medications: ProAir 4 puffs every 4-6 hours as needed.   - Asthma control goals:  * Full participation in all desired activities (may need albuterol before activity) * Albuterol use two time or less a week on average (not counting use with activity) * Cough interfering with sleep two time or less a month * Oral steroids no more than once a  year * No hospitalizations   Allergic rhinoconjunctivitis - Continue Astelin 1 spray twice daily. - Continue cetirizine 10mg  daily.  Adverse food reactions (peanuts, soy, shellfish) - Continue to avoid these foods. - EpiPen is up to date.    Return in about 3-4 months or sooner if needed  I appreciate the opportunity to take part in Alexei's care. Please do not hesitate to contact me with questions.  Sincerely,   Prudy Feeler, MD Allergy/Immunology [facility]

## 2018-07-30 NOTE — Patient Instructions (Addendum)
Chronic Rhinosinusitis  - symptoms are improved.  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  Lung function testing today is normal! - Daily controller medication(s): Symbicort 160/4.5 two puffs twice daily with spacer - Rescue medications: ProAir 4 puffs every 4-6 hours as needed.   - Asthma control goals:  * Full participation in all desired activities (may need albuterol before activity) * Albuterol use two time or less a week on average (not counting use with activity) * Cough interfering with sleep two time or less a month * Oral steroids no more than once a year * No hospitalizations   Allergic rhinoconjunctivitis - Continue Astelin 1 spray twice daily. - Continue cetirizine 10mg  daily.  Adverse food reactions (peanuts, soy, shellfish) - Continue to avoid these foods. - EpiPen is up to date.    Return in about 3-4 months or sooner if needed

## 2018-08-24 NOTE — Progress Notes (Signed)
[facility]    Provider:  Debbora Presto, FNP-C  Referring Provider: Helane Rima, MD Primary Care Physician:  Sue Lush, PA-C  CC:  Myasthenia Gravis  Interval history 08/25/2018: She is doing great. No symptoms. She is tolerating cell cept with no obvious adverse effects. She is trying to stay active. She is following up regularly with PCP, allergist, nephrologist and ophthalmologist for chronic disease management. CT chest negative for thymoma. She could not tolerate Mestinon.    Copied from Dr Cathren Laine note 02/24/2018: Interval history: here for follow up on myasthenia Gravis. Doing well on Cellcept. No symptoms even when she was sick. No diplopia, no difficulty swallowing, continue cellcept.  Interval history; Doing well, no double vision, feels improved. No side effects to the medication. Taking medication regularly. No SOB, no double vision, no weakness.   Interval 03/12/2017:  Nicole Holland is a 83 y.o. female here as a referral from Dr. Lavone Neri for Myasthenia Gravis.ACHR binding antibody serum was positive. No double vision. Feels improved. She has finished her prednisone.  Will increase CellCept 1000 mg twice a day which is the preferred dose. Would prefer patient to come back in 3 months however she declines, so she does not want to come back for 6 months. We will do labs monthly until she sees me again. Discussed side effects again and asked her to please call our office if there is any side effects whatsoever.  Interval History 12/09/2016:  Nicole Holland is a 83 y.o. female here as a referral from Dr. Lavone Neri for Myasthenia Gravis.ACHR binding antibody serum was positive.  I had a long talk with her at last appointment however I don't the patient understands there may be memory problems. Asked her son to come along with her that she is here alone again today. I asked her to explain to me what is going on and she said "I really don't know what is going on".  She had a long appointment with the ophthalmologist to discuss MG and also I spoke to her about MG but appears she has memory problems and a vague understanding.   [facility]  [facility] Nicole Harbisonis a 83 y.o.femalehere as a referral from Dr. Dorna Bloom myogenic ptosis. She noticed eyelid drooping for a year. The eye symptoms don;t change with time of day, not better in the morning or evening. Pollen makes it worse, night driving is hard. No significant improvement after a nap. The left eye is worse. It is stable. People think she is asleep when she is not actually asleep. No double vision. No difficulty swallowing, breathing, no voice changing. Pollen season has been bad. No voice changes. Her legs try to give out on her but that's been "going on forever" no new muscle or new difficulties lifting arms overhead or climbing stairs. She can comb and style hair, brush teeth, can get out of seats. No neck weakness.   Reviewed notes, labs and imaging from outside physicians, which showed:   Drooping of the right eye and left eye. Patient's [facility]. Eyelids have been drooping for the past year and getting worse, causing I was to feel heavy and rubs slightly as the eyes. She is not able to see as clearly as she used to. She feels is progressively getting worse. Eyes have been tearing and burning which makes vision blurry sometimes. She can't see as clear as she would like to due to her eyelids drooping down in her eyes  very bothersome. In driving at night has become a problem. Reviewed exam which showed a 20/60 in 2040 distance 20/50 in 20/30 near, patient was not dilated, intraocular pressure is normal, pupils equally round and reactive to light no APD, open angles, extraocular movements intact, myogenic ptosis, anterior chamber is normal, adnexa normal. They were referred here for EMG. Myasthenia panel was sent and a ACHR binding antibody serum was  positive.   Reviewed labs: Receptor binding antibodies were elevated at 0.29 otherwise blocking antibodies and modulating antibodies were negative.  Review of Systems: Patient complains of symptoms per HPI as well as the following symptoms: none reported,  no SOB, no CP. Pertinent negatives per HPI. All others negative.    Social History   Socioeconomic History  . Marital status: Married    Spouse name: separated, still married  . Number of children: 4  . Years of education: 14  . Highest education level: Not on file  Occupational History    Comment: NA  Social Needs  . Financial resource strain: Not on file  . Food insecurity:    Worry: Not on file    Inability: Not on file  . Transportation needs:    Medical: Not on file    Non-medical: Not on file  Tobacco Use  . Smoking status: Never Smoker  . Smokeless tobacco: Never Used  Substance and Sexual Activity  . Alcohol use: No  . Drug use: No  . Sexual activity: Never    Birth control/protection: Post-menopausal    Comment: mensus age 63,G5P4, 8st preg age 26, b.c short time,couldn't tolerate  Lifestyle  . Physical activity:    Days per week: Not on file    Minutes per session: Not on file  . Stress: Not on file  Relationships  . Social connections:    Talks on phone: Not on file    Gets together: Not on file    Attends religious service: Not on file    Active member of club or organization: Not on file    Attends meetings of clubs or organizations: Not on file    Relationship status: Not on file  . Intimate partner violence:    Fear of current or ex partner: Not on file    Emotionally abused: Not on file    Physically abused: Not on file    Forced sexual activity: Not on file  Other Topics Concern  . Not on file  Social History Narrative   Lives alone   Caffeine- none   Right handed    Family History  Problem Relation Age of Onset  . Stroke Mother   . Diabetes Mother   . Cancer Mother   . Cancer  Father        Lung cancer  . Diabetes Sister   . Cancer Sister        Unknown Cancer  . Diabetes Brother   . Cancer Brother        Unknown cancer  . Diabetes Brother   . Cancer Brother        Unknown cancer  . Cancer Brother        Unknown Cancer  . Diabetes Brother   . Cancer Brother        Unknown Cancer  . Autoimmune disease Neg Hx     Past Medical History:  Diagnosis Date  . Arthritis   . Asthma   . Breast cancer ([place]) 08/02/11   right low grade ductal ca in situ upper  outer quad   . C. difficile diarrhea 2019  . Gout   . Hyperlipidemia   . Hypertension   . S/P radiation therapy 10/28/11 - 12/02/11   Right Breast/ 5000cGy/ 25 Fractions  . Sinus infection 2019  . Use of tamoxifen (Nolvadex) 12/04/11   Dr. Truddie Coco  . Vitamin D deficiency     Past Surgical History:  Procedure Laterality Date  . ABDOMINAL HYSTERECTOMY  1994   partial ovaries intact  . BREAST LUMPECTOMY  08/02/11   Lumpectomy right Breast; Low Grade Ductal Carcinoma In Situ with Associated Calcifications, ER 65%, PR 99%  . BREAST SURGERY  02/01/2000   right breast subareolar core biopsy  . BREAST SURGERY  09/27/11   Right Breast; Excision of  Anterior Margin- Benign Breast Parenchyma, Negative for In situ and Invasive Cancer, Surgical Margins Negative, Benign skin, Negative for Malignancy  . CATARACT EXTRACTION, BILATERAL  2015, 2016  . CESAREAN SECTION    . TONSILLECTOMY      Current Outpatient Medications  Medication Sig Dispense Refill  . albuterol (PROAIR HFA) 108 (90 Base) MCG/ACT inhaler Inhale 2 puffs into the lungs every 6 (six) hours as needed for wheezing or shortness of breath. 1 Inhaler 1  . atorvastatin (LIPITOR) 20 MG tablet Take 20 mg by mouth at bedtime.  0  . azelastine (ASTELIN) 0.1 % nasal spray Place 1 spray into both nostrils 2 (two) times daily. Use in each nostril as directed 30 mL 5  . budesonide-formoterol (SYMBICORT) 160-4.5 MCG/ACT inhaler Inhale 2 puffs into the lungs as  needed. 1 Inhaler 5  . carvedilol (COREG) 6.25 MG tablet Take 12.5 mg by mouth 2 (two) times daily.  0  . cetirizine (ZYRTEC) 10 MG tablet TK 1 T PO QD    . diphenhydrAMINE (BENADRYL) 25 mg capsule Take by mouth.    . EPINEPHrine (EPIPEN 2-PAK) 0.3 mg/0.3 mL IJ SOAJ injection     . furosemide (LASIX) 40 MG tablet Take 40 mg by mouth daily.  0  . glucose blood (ONE TOUCH ULTRA TEST) test strip TEST once daily    . guaiFENesin (MUCINEX) 600 MG 12 hr tablet Take 600 mg by mouth as needed.     . Lactobacillus (PROBIOTIC ACIDOPHILUS PO) Take by mouth.    . losartan (COZAAR) 25 MG tablet Take 25 mg by mouth daily.  0  . mometasone (NASONEX) 50 MCG/ACT nasal spray Place 2 sprays into the nose daily. 17 g 5  . Multiple Vitamins-Minerals (CENTRUM SILVER PO) Take 1 tablet by mouth daily.     . mycophenolate (CELLCEPT) 500 MG tablet Take 2 tablets (1,000 mg total) by mouth 2 (two) times daily. 360 tablet 4  . olopatadine (PATANOL) 0.1 % ophthalmic solution instill 1 drop into both eyes daily 5 mL 5  . Olopatadine HCl (PATADAY) 0.2 % SOLN Apply 1 drop to eye as needed (itching).     . Omega-3 Fatty Acids (FISH OIL) 1200 MG CAPS Take 1,200 mg by mouth 2 (two) times daily.     Glory Rosebush DELICA LANCETS 78I MISC TEST ONCE DAILY AS DIRECTED    . Polyethylene Glycol 400 (BLINK TEARS) 0.25 % SOLN Apply 1 drop to eye as needed (dry eyes).     . vitamin C (ASCORBIC ACID) 500 MG tablet Take 500 mg by mouth daily.       No current facility-administered medications for this visit.     Allergies as of 08/25/2018 - Review Complete 07/30/2018  Allergen Reaction Noted  .  Iodides Itching 10/25/2010  . Peanut oil Other (See Comments) 01/04/2016  . Shellfish allergy Other (See Comments) 01/04/2016  . Soybean-containing drug products Other (See Comments) 01/04/2016  . Vaccinium angustifolium Other (See Comments) 01/04/2016  . Mestinon [pyridostigmine bromide] Diarrhea 12/06/2016  . Penicillins Itching 10/25/2010     Vitals: There were no vitals taken for this visit. Last Weight:  Wt Readings from Last 1 Encounters:  07/30/18 165 lb (74.8 kg)   Last Height:   Ht Readings from Last 1 Encounters:  07/30/18 4\' 11"  (1.499 Nicole)     Exam: Gen: NAD, conversant, well nourised, obese, well groomed  CV: RRR, +SEM. No Carotid Bruits. No peripheral edema, warm, nontender Eyes: Conjunctivae clear without exudates or hemorrhage  Neuro: Detailed Neurologic Exam  Speech: Speech is normal; fluent and spontaneous with normal comprehension.  Cognition: The patient is oriented to person, place, and time;  recent and remote memory intact;  language fluent;  normal attention, concentration,  fund of knowledge Cranial Nerves: The pupils are equal, round, and reactive to light. Attempted fundoscopic exam could not visualize. Visual fields are full to finger confrontation. Impaired left upgaze with fatiguability. Trigeminal sensation is intact and the muscles of mastication are normal. Left> right ptosis. Left to mid pupil right just above pupil.The palate elevates in the midline. Hearing intact. Voice is normal. Shoulder shrug is normal. The tongue has normal motion without fasciculations.   Coordination: No dysmetria  Gait: slow but stable   Motor Observation: can get out of chair without using hands. no involuntary movements noted. Tone: Normal muscle tone.   Posture: Posture is normal. normal erect  Strength: 4/5 in bilateral upper and lower extremitites.     Sensation: intact to LT  Reflex Exam:  DTR's: Deep tendon reflexes in the upper and lower extremities are brisk bilaterally.        Assessment/Plan:  83 year old with seropositive myasthenia gravis, acetylcholine receptor binding antibody positive, doing well  - CT of the chest to eval for thymoma: Negative - Continue CellCept 1000 mg twice daily.   Discussed side effects again, call for any side effects at all.  - Labs regularly with PCP  - Follow up in 1 year per patient request, will call sooner for any new or concerning symptoms.   Again, Discussed that shouldshe have acute worsening especially shortness of breath patient should call 911 and be transported to the emergency room due to risks for myasthenia gravis exacerbation and respiratory failure.   Debbora Presto, FNP-C   [facility] [address] [place], [place] [address]  Phone [phone] Fax [phone]  A total of 15 minutes was spent face-to-face with this patient. Over half this time was spent on counseling patient on the myasthenia gravis diagnosis and different diagnostic and therapeutic options available.

## 2018-08-25 ENCOUNTER — Encounter: Payer: Self-pay | Admitting: Family Medicine

## 2018-08-25 ENCOUNTER — Ambulatory Visit: Payer: Medicare Other | Admitting: Family Medicine

## 2018-08-25 VITALS — BP 147/78 | HR 69 | Ht 59.0 in | Wt 167.2 lb

## 2018-08-25 DIAGNOSIS — G7000 Myasthenia gravis without (acute) exacerbation: Secondary | ICD-10-CM

## 2018-08-25 NOTE — Progress Notes (Signed)
Personally  participated in, made any corrections needed, and agree with history, physical, neuro exam,assessment and plan as stated.     Antonia Ahern, MD [facility]     

## 2018-08-25 NOTE — Patient Instructions (Addendum)
Continue Cellcept as prescribed.   Continue chronic disease management with PCP, allergist and kidney doctor.   Myasthenia Gravis Myasthenia gravis (MG) is a long-term (chronic) condition that causes weakness in the muscles you can control (voluntary muscles). MG can affect any voluntary muscle. The muscles most often affected are the ones that control:  Eye movement.  Facial movements.  Swallowing. MG is a disease in which the body's disease-fighting system (immune system) attacks its own healthy tissues (autoimmune disease). When you have MG, your immune system makes proteins (antibodies) that block the chemical (acetylcholine) that your body needs to send nerve signals to your muscles. This causes muscle weakness. What are the causes? The exact cause of MG is not known. What increases the risk? The following factors may make you more likely to develop this condition:  Having an enlarged thymus gland. The thymus gland is located under the breastbone. It makes certain cells for the immune system.  Having a family history of MG. What are the signs or symptoms? Symptoms of MG may include:  Drooping eyelids.  Double vision.  Muscle weakness that gets worse with activity and gets better after rest.  Difficulty walking.  Trouble chewing and swallowing.  Trouble making facial expressions.  Slurred speech.  Weakness of the arms, hands, and legs. Sudden, severe difficulty breathing (myasthenic crisis) may develop after having:  An infection.  A fever.  A bad reaction to a medicine. Myasthenic crisis requires emergency breathing support. Sometimes symptoms of MG go away for a while (remission) and then come back later. How is this diagnosed? This condition may be diagnosed based on:  Your symptoms and medical history.  A physical exam.  Blood tests.  Tests of your muscle strength and function.  Imaging tests, such as a CT scan or an MRI. How is this treated? The  goal of treatment is to improve muscle strength. Treatment may include:  Taking medicine.  Making lifestyle changes that focus on saving your energy.  Doing physical therapy to gain strength.  Having surgery to remove the thymus gland (thymectomy). This may result in a long remission for some people.  Having a procedure to remove the acetylcholine antibodies (plasmapheresis).  Getting emergency breathing support, if you experience myasthenic crisis. If you experience remission, you may be able to stop treatment and then resume treatment when your symptoms return. Follow these instructions at home:   Take over-the-counter and prescription medicines only as told by your health care provider.  Get plenty of rest and sleep. Take frequent breaks to rest your eyes, especially when in bright light or working on a computer.  Maintain a healthy diet and a healthy weight. Work with your health care provider or a diet and nutrition specialist (dietitian) if you need help.  Do exercises as told by your health care provider or physical therapist.  Do not use any products that contain nicotine or tobacco, such as cigarettes and e-cigarettes. If you need help quitting, ask your health care provider.  Prevent infections by: ? Washing your hands often with soap and water. If soap and water are not available, use hand sanitizer. ? Avoiding contact with other people who are sick. ? Avoiding touching your eyes, nose, and mouth. ? Cleaning surfaces in your home that are touched often using a disinfectant.  Keep all follow-up visits as told by your health care provider. This is important. Contact a health care provider if:  Your symptoms change or get worse, especially after having a fever  or infection. Get help right away if:  You have trouble breathing. Summary  Myasthenia gravis (MG) is a long-term (chronic) condition that causes weakness in the muscles you can control (voluntary  muscles).  A symptom of MG is muscle weakness that gets worse with activity and gets better after rest.  Sudden, severe difficulty breathing (myasthenic crisis) may develop after having an infection, a fever, or a bad reaction to a medicine.  The goal of treatment is to improve muscle strength. Treatment may include medicines, lifestyle changes, physical therapy, surgery, plasmapheresis, or emergency breathing support. This information is not intended to replace advice given to you by your health care provider. Make sure you discuss any questions you have with your health care provider. Document Released: 09/09/2000 Document Revised: 06/16/2017 Document Reviewed: 06/16/2017 Elsevier Interactive Patient Education  2019 [employer].

## 2018-09-14 ENCOUNTER — Ambulatory Visit: Payer: Medicare Other | Admitting: Adult Health

## 2018-11-23 ENCOUNTER — Other Ambulatory Visit: Payer: Self-pay | Admitting: *Deleted

## 2018-11-23 MED ORDER — OLOPATADINE HCL 0.1 % OP SOLN: OPHTHALMIC | 1 refills | Status: DC

## 2018-11-27 ENCOUNTER — Ambulatory Visit: Payer: Medicare Other | Admitting: Allergy

## 2018-12-31 ENCOUNTER — Ambulatory Visit: Payer: Medicare Other | Admitting: Allergy

## 2018-12-31 ENCOUNTER — Encounter: Payer: Self-pay | Admitting: Allergy

## 2018-12-31 ENCOUNTER — Other Ambulatory Visit: Payer: Self-pay

## 2018-12-31 VITALS — BP 118/64 | HR 70 | Temp 98.3°F | Resp 18

## 2018-12-31 DIAGNOSIS — J3089 Other allergic rhinitis: Secondary | ICD-10-CM

## 2018-12-31 DIAGNOSIS — J328 Other chronic sinusitis: Secondary | ICD-10-CM

## 2018-12-31 DIAGNOSIS — J4540 Moderate persistent asthma, uncomplicated: Secondary | ICD-10-CM | POA: Diagnosis not present

## 2018-12-31 DIAGNOSIS — T7800XD Anaphylactic reaction due to unspecified food, subsequent encounter: Secondary | ICD-10-CM | POA: Diagnosis not present

## 2018-12-31 MED ORDER — BUDESONIDE-FORMOTEROL FUMARATE 160-4.5 MCG/ACT IN AERO: 2.0000 | INHALATION_SPRAY | RESPIRATORY_TRACT | 5 refills | Status: DC | PRN

## 2018-12-31 MED ORDER — ALBUTEROL SULFATE HFA 108 (90 BASE) MCG/ACT IN AERS: 2.0000 | INHALATION_SPRAY | Freq: Four times a day (QID) | RESPIRATORY_TRACT | 3 refills | Status: DC | PRN

## 2018-12-31 MED ORDER — AZELASTINE HCL 0.1 % NA SOLN: 1.0000 | Freq: Two times a day (BID) | NASAL | 5 refills | Status: DC

## 2018-12-31 NOTE — Patient Instructions (Addendum)
Chronic Rhinosinusitis  - symptoms are improved.  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  Lung function testing today is normal! - Daily controller medication(s): Symbicort 160/4.5 two puffs twice daily with spacer - Rescue medications: ProAir 4 puffs every 4-6 hours as needed.   - Asthma control goals:  * Full participation in all desired activities (may need albuterol before activity) * Albuterol use two time or less a week on average (not counting use with activity) * Cough interfering with sleep two time or less a month * Oral steroids no more than once a year * No hospitalizations   Allergic rhinoconjunctivitis - Continue Astelin 1 spray twice daily. - Continue cetirizine 10mg  daily.  Adverse food reactions (peanuts, soy, shellfish) - Continue to avoid these foods. - EpiPen is up to date.    Return in about 4-6 months or sooner if needed

## 2018-12-31 NOTE — Progress Notes (Signed)
Follow-up Note  RE: Nicole Holland MRN: [id number] DOB: 1935-08-18 Date of Office Visit: 12/31/2018   History of present illness: Nicole Holland is a 83 y.o. female presenting today for follow-up of chronic rhinosinusitis, asthma, allergic rhinitis with conjunctivitis and adverse food reaction.  She was seen in the office last on 07/30/18 by myself.   She states she has been doing relatively well since her last visit.  She states she may need to use her pro-air couple times a week on average.  She states she is using her Symbicort 2 puffs twice a day with her spacer.. She has not required any systemic steroids or any urgent care or ED visits since her last visit for her asthma. She does state that she is having more nasal drainage and chest congestion that she feels like she cannot quite move.  She will use Mucinex to help break up the phlegm which does help. For control of her nasal drainage she does use Astelin and she is taking Zyrtec daily. She continues to avoid peanuts, soy and shellfish and has not had any accidental ingestions or need to use her EpiPen.   Review of systems: Review of Systems  Constitutional: Negative for chills, fever and malaise/fatigue.  HENT: Positive for congestion. Negative for ear discharge, ear pain, nosebleeds, sinus pain and sore throat.   Eyes: Negative for pain, discharge and redness.  Respiratory: Positive for cough, shortness of breath and wheezing.   Cardiovascular: Negative for chest pain.  Gastrointestinal: Negative for abdominal pain, constipation, diarrhea, heartburn, nausea and vomiting.  Musculoskeletal: Negative for joint pain.  Skin: Negative for itching and rash.  Neurological: Negative for headaches.    All other systems negative unless noted above in HPI  Past medical/social/surgical/family history have been reviewed and are unchanged unless specifically indicated below.  No changes  Medication List: Allergies as of 12/31/2018       Reactions   Iodides Itching   TOTAL BODY   Peanut Oil Other (See Comments)   Recent allergy testing   Shellfish Allergy Other (See Comments)   Per allergy testing   Soybean-containing Drug Products Other (See Comments)   Per allergy testing   Vaccinium Angustifolium Other (See Comments)   Per allergy testing   Mestinon [pyridostigmine Bromide] Diarrhea   "Stomach cramps, blood in stool"   Penicillins Itching   Has patient had a PCN reaction causing immediate rash, facial/tongue/throat swelling, SOB or lightheadedness with hypotension: no-- pt did not have rash but was itchy all over body Has patient had a PCN reaction causing severe rash involving mucus membranes or skin necrosis: no Has patient had a PCN reaction that required hospitalization no Has patient had a PCN reaction occurring within the last 10 years: no If all of the above answers are "NO", then may proceed with Cephalosporin use.      Medication List       Accurate as of December 31, 2018  6:33 PM. If you have any questions, ask your nurse or doctor.        albuterol 108 (90 Base) MCG/ACT inhaler Commonly known as: ProAir HFA Inhale 2 puffs into the lungs every 6 (six) hours as needed for wheezing or shortness of breath.   atorvastatin 20 MG tablet Commonly known as: LIPITOR Take 20 mg by mouth at bedtime.   azelastine 0.1 % nasal spray Commonly known as: ASTELIN Place 1 spray into both nostrils 2 (two) times daily. Use in each nostril as directed  BLACK ELDERBERRY PO Take by mouth.   Blink Tears 0.25 % Soln Generic drug: Polyethylene Glycol 400 Apply 1 drop to eye as needed (dry eyes).   budesonide-formoterol 160-4.5 MCG/ACT inhaler Commonly known as: SYMBICORT Inhale 2 puffs into the lungs as needed.   carvedilol 6.25 MG tablet Commonly known as: COREG Take 12.5 mg by mouth 2 (two) times daily.   CENTRUM SILVER PO Take 1 tablet by mouth daily.   cetirizine 10 MG tablet Commonly known as:  ZYRTEC TK 1 T PO QD   diphenhydrAMINE 25 mg capsule Commonly known as: BENADRYL Take by mouth.   EpiPen 2-Pak 0.3 mg/0.3 mL Soaj injection Generic drug: EPINEPHrine   Fish Oil 1200 MG Caps Take 1,200 mg by mouth 2 (two) times daily.   furosemide 40 MG tablet Commonly known as: LASIX Take 40 mg by mouth daily.   guaiFENesin 600 MG 12 hr tablet Commonly known as: MUCINEX Take 600 mg by mouth as needed.   losartan 25 MG tablet Commonly known as: COZAAR Take 25 mg by mouth daily.   mometasone 50 MCG/ACT nasal spray Commonly known as: Nasonex Place 2 sprays into the nose daily.   mycophenolate 500 MG tablet Commonly known as: CELLCEPT Take 2 tablets (1,000 mg total) by mouth 2 (two) times daily.   ONE TOUCH ULTRA TEST test strip Generic drug: glucose blood TEST once daily   OneTouch Delica Lancets 16S Misc TEST ONCE DAILY AS DIRECTED   Pataday 0.2 % Soln Generic drug: Olopatadine HCl Apply 1 drop to eye as needed (itching).   olopatadine 0.1 % ophthalmic solution Commonly known as: PATANOL instill 1 drop into both eyes daily   PROBIOTIC ACIDOPHILUS PO Take by mouth.   vitamin C 500 MG tablet Commonly known as: ASCORBIC ACID Take 500 mg by mouth daily.       Known medication allergies: Allergies  Allergen Reactions  . Iodides Itching    TOTAL BODY  . Peanut Oil Other (See Comments)    Recent allergy testing  . Shellfish Allergy Other (See Comments)    Per allergy testing  . Soybean-Containing Drug Products Other (See Comments)    Per allergy testing  . Vaccinium Angustifolium Other (See Comments)    Per allergy testing  . Mestinon [Pyridostigmine Bromide] Diarrhea    "Stomach cramps, blood in stool"  . Penicillins Itching    Has patient had a PCN reaction causing immediate rash, facial/tongue/throat swelling, SOB or lightheadedness with hypotension: no-- pt did not have rash but was itchy all over body Has patient had a PCN reaction causing severe  rash involving mucus membranes or skin necrosis: no Has patient had a PCN reaction that required hospitalization no Has patient had a PCN reaction occurring within the last 10 years: no If all of the above answers are "NO", then may proceed with Cephalosporin use.       Physical examination: Blood pressure 118/64, pulse 70, temperature 98.3 F (36.8 C), temperature source Temporal, resp. rate 18, SpO2 97 %.  General: Alert, interactive, in no acute distress. HEENT: PERRLA, TMs pearly gray, turbinates minimally edematous without discharge, post-pharynx non erythematous. Neck: Supple without lymphadenopathy. Lungs: Clear to auscultation without wheezing, rhonchi or rales. {no increased work of breathing. CV: Normal S1, S2 without murmurs. Abdomen: Nondistended, nontender. Skin: Warm and dry, without lesions or rashes. Extremities:  No clubbing, cyanosis or edema. Neuro:   Grossly intact.  Diagnositics/Labs:  Spirometry: FEV1: 0.81L 56%, FVC: 1.08L 54%, ratio consistent with restrictive pattern.  this is reduced from previous study  Assessment and plan:   Chronic Rhinosinusitis  - symptoms are improved.  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  Lung function testing today is normal! - Daily controller medication(s): Symbicort 160/4.5 two puffs twice daily with spacer - Rescue medications: ProAir 4 puffs every 4-6 hours as needed.   - Asthma control goals:  * Full participation in all desired activities (may need albuterol before activity) * Albuterol use two time or less a week on average (not counting use with activity) * Cough interfering with sleep two time or less a month * Oral steroids no more than once a year * No  hospitalizations   Allergic rhinoconjunctivitis - Continue Astelin 1 spray twice daily. - Continue cetirizine 10mg  daily.  Adverse food reactions (peanuts, soy, shellfish) - Continue to avoid these foods. - EpiPen is up to date.    Return in about 4-6 months or sooner if needed   I appreciate the opportunity to take part in Nicole Holland's care. Please do not hesitate to contact me with questions.  Sincerely,   Prudy Feeler, MD Allergy/Immunology [facility]

## 2019-01-04 ENCOUNTER — Other Ambulatory Visit: Payer: Self-pay | Admitting: Gastroenterology

## 2019-01-04 DIAGNOSIS — R195 Other fecal abnormalities: Secondary | ICD-10-CM

## 2019-01-15 ENCOUNTER — Ambulatory Visit
Admission: RE | Admit: 2019-01-15 | Discharge: 2019-01-15 | Disposition: A | Discharge status: Home / Self Care | Payer: Medicare Other | Source: Ambulatory Visit | Attending: Gastroenterology | Admitting: Gastroenterology

## 2019-01-15 DIAGNOSIS — R195 Other fecal abnormalities: Secondary | ICD-10-CM

## 2019-02-26 ENCOUNTER — Other Ambulatory Visit: Payer: Self-pay | Admitting: Neurology

## 2019-02-26 DIAGNOSIS — G7000 Myasthenia gravis without (acute) exacerbation: Secondary | ICD-10-CM

## 2019-02-26 DIAGNOSIS — Z79899 Other long term (current) drug therapy: Secondary | ICD-10-CM

## 2019-03-02 IMAGING — CT CT CHEST W/O CM
3 of 5 series · 17 of 39 positions shown, 19 images · non-contrast
Comparison: 09/03/2015

CLINICAL DATA: Recently diagnosed myasthenia gravis

EXAM:
CT CHEST WITHOUT CONTRAST
TECHNIQUE: Multidetector CT imaging of the chest was performed following the
standard protocol without IV contrast.

[Series 3: chest w/o · axial · non-contrast · 0.78mm/px · z∈[-177,-7]mm · 5 of 104 slices shown]
[im 18/104  lung]
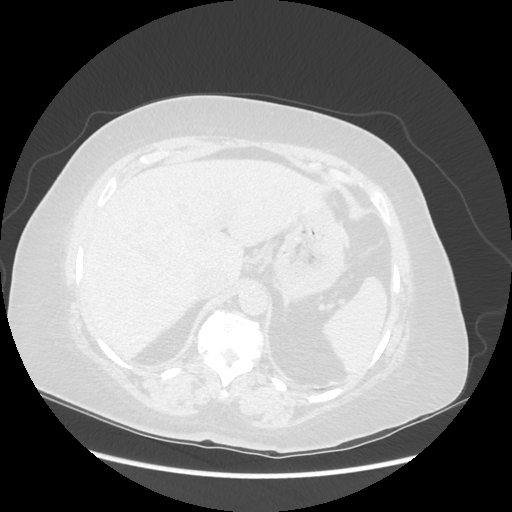
[im 35/104  lung]
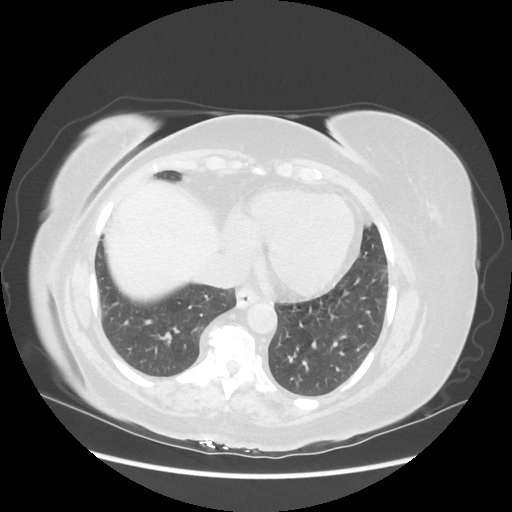
[im 52/104  lung]
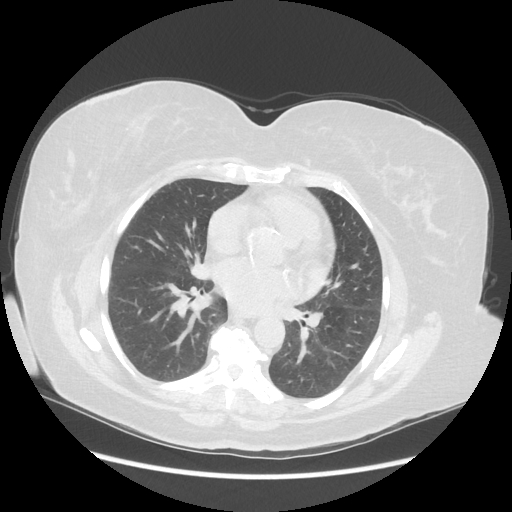
[im 69/104  lung]
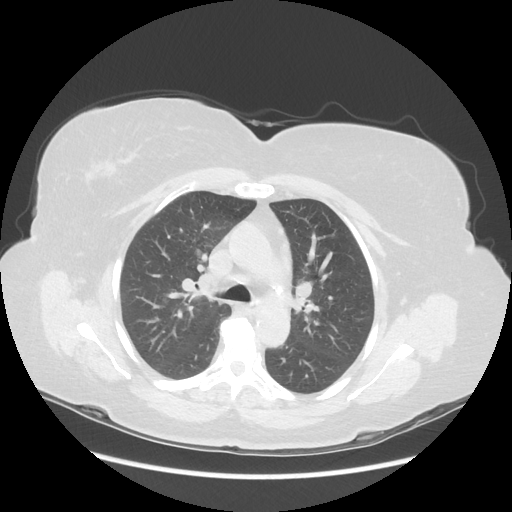
[im 86/104  lung]
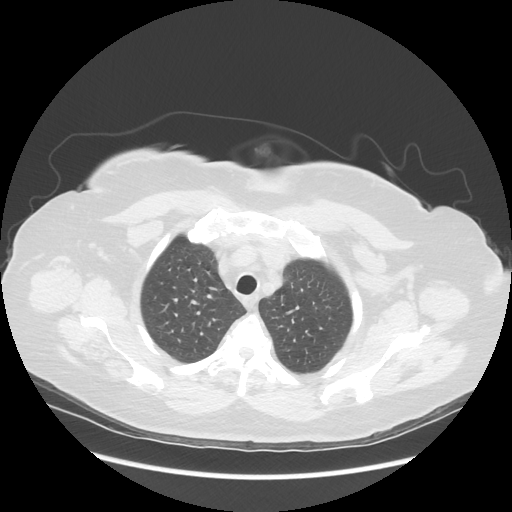

[Series 4: lung windows · axial · 0.78mm/px · z∈[-185,+0]mm · 6 of 104 slices shown, 8 images]
[im 15/104  mediastinal]
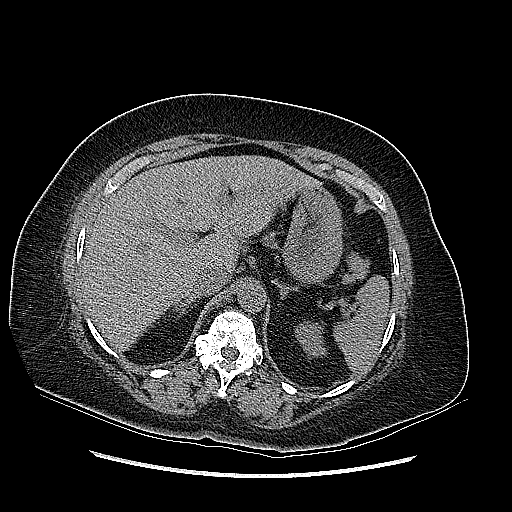
[im 15/104  lung]
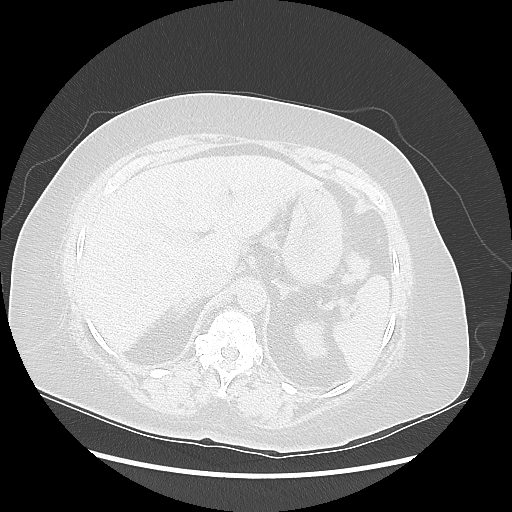
[im 30/104  lung]
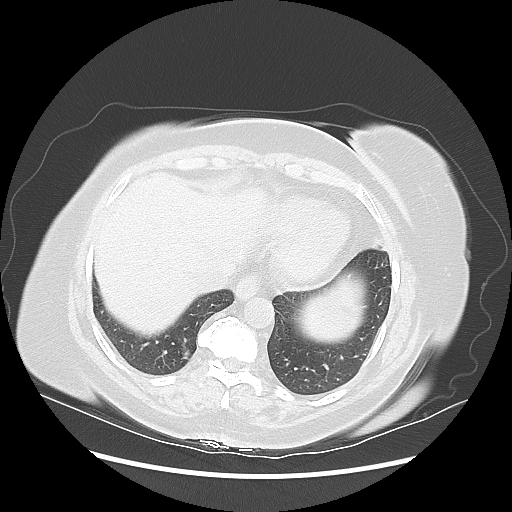
[im 45/104  lung]
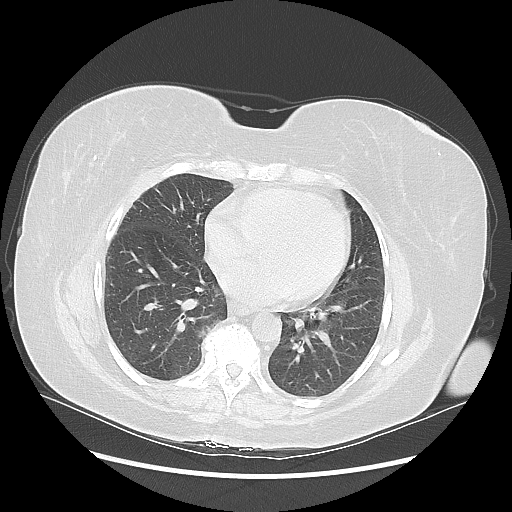
[im 59/104  lung]
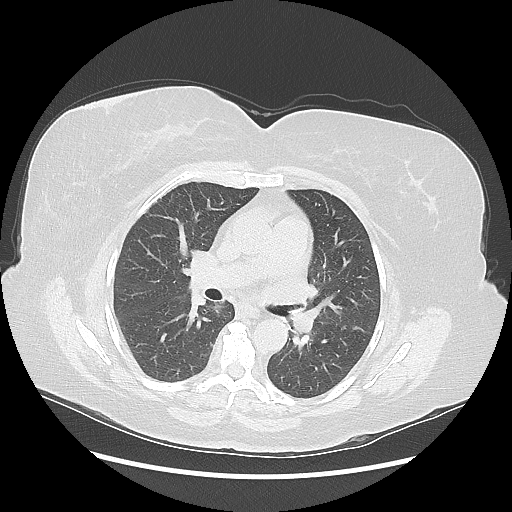
[im 74/104  mediastinal]
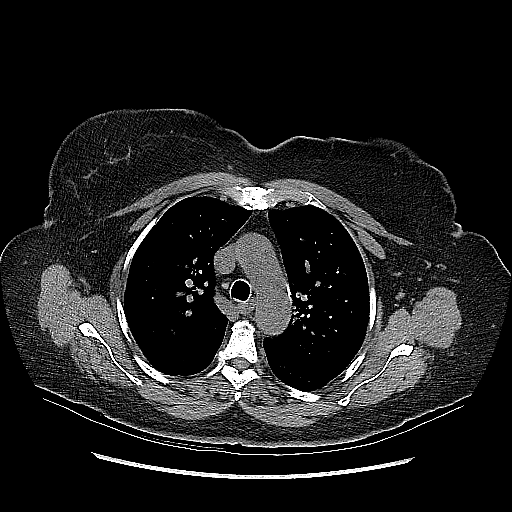
[im 74/104  lung]
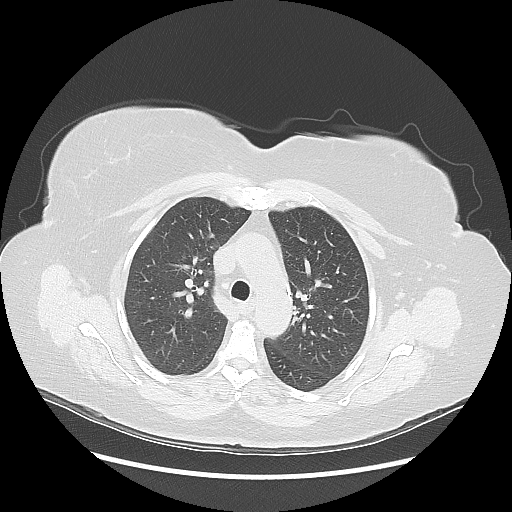
[im 89/104  lung]
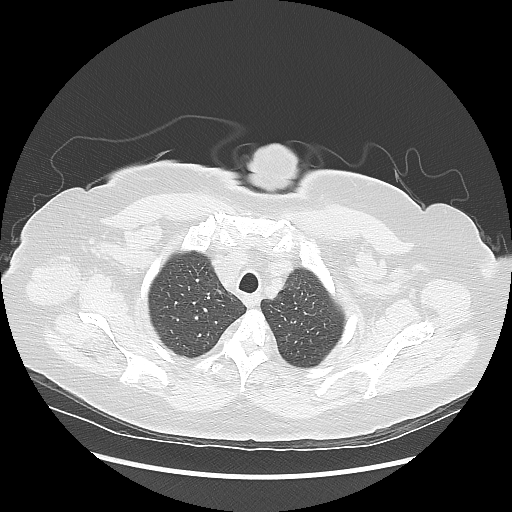

[Series 602: sagittal body · sagittal · 0.78mm/px · 6 of 160 slices shown]
[im 15/160  mediastinal]
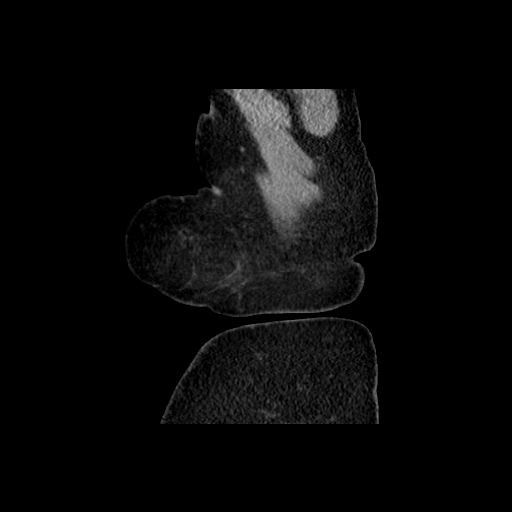
[im 29/160  mediastinal]
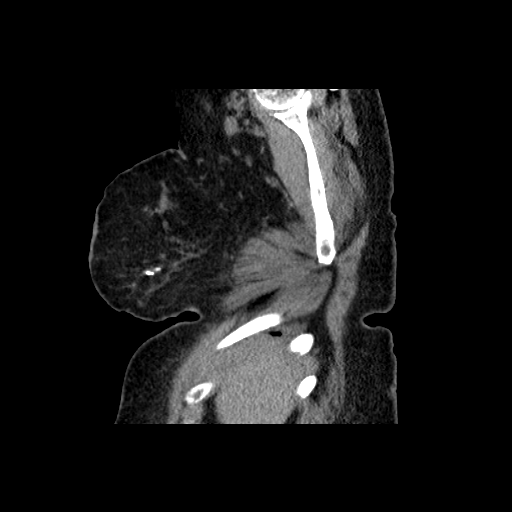
[im 58/160  mediastinal]
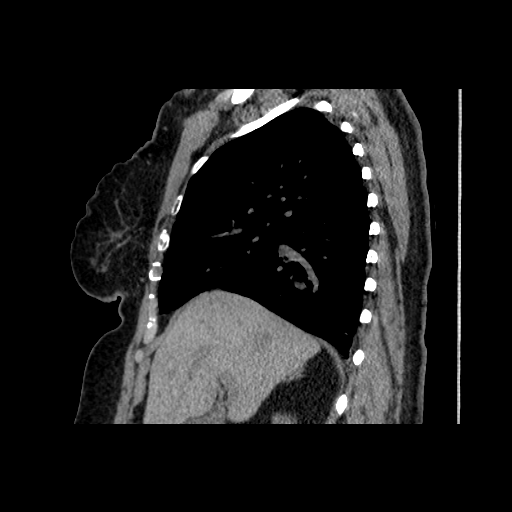
[im 73/160  mediastinal]
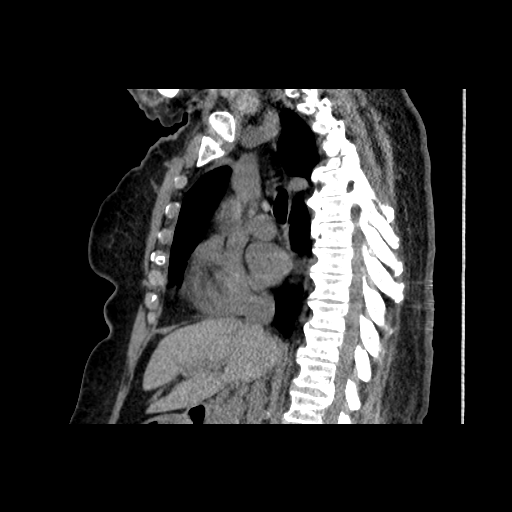
[im 87/160  mediastinal]
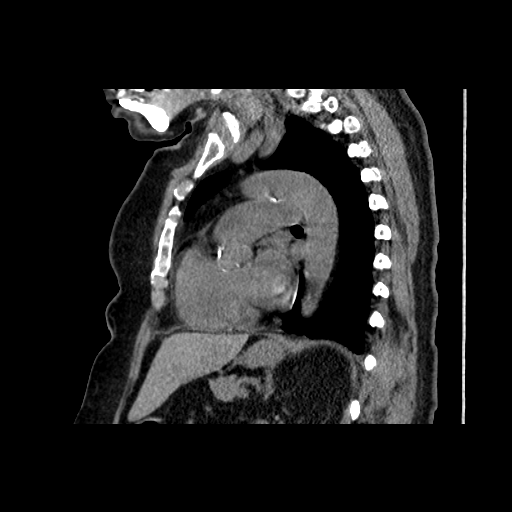
[im 102/160  mediastinal]
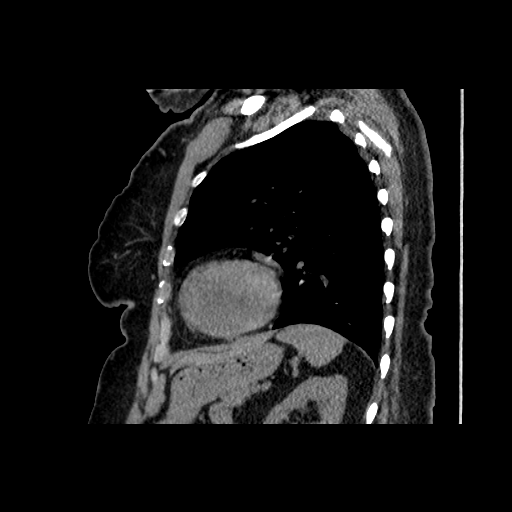

[17 of 39 positions shown; findings below may reference images not displayed]

FINDINGS: Cardiovascular: Somewhat limited by the lack of IV contrast. The
thoracic aorta shows mild atherosclerotic calcifications without
aneurysmal dilatation. Coronary calcifications are seen. No cardiac
enlargement is noted.

Mediastinum/Nodes: The thoracic inlet appears within normal limits.
No significant hilar or mediastinal adenopathy is identified. No
anterior mediastinal mass lesion is noted to suggest thymoma. No
significant residual thymus tissue is seen.

Lungs/Pleura: The lungs are well aerated bilaterally. Mild right
posterior costophrenic angle atelectasis is seen. No sizable
effusion is noted. No parenchymal nodules are noted.

Upper Abdomen: No acute abnormality noted.

Musculoskeletal: Bony structures show degenerative change of the
thoracic spine. No acute bony abnormality is seen.
IMPRESSION: No evidence of residual thymus tissue or thymoma is noted.

Mild right basilar atelectasis.

No other acute abnormality is seen.

## 2019-06-03 ENCOUNTER — Other Ambulatory Visit: Payer: Self-pay

## 2019-06-03 ENCOUNTER — Encounter: Payer: Self-pay | Admitting: Allergy

## 2019-06-03 ENCOUNTER — Ambulatory Visit (INDEPENDENT_AMBULATORY_CARE_PROVIDER_SITE_OTHER): Payer: Medicare Other | Admitting: Allergy

## 2019-06-03 VITALS — BP 138/66 | HR 79 | Temp 97.9°F | Resp 16 | Ht 59.0 in | Wt 167.6 lb

## 2019-06-03 DIAGNOSIS — J328 Other chronic sinusitis: Secondary | ICD-10-CM | POA: Diagnosis not present

## 2019-06-03 DIAGNOSIS — J4540 Moderate persistent asthma, uncomplicated: Secondary | ICD-10-CM | POA: Diagnosis not present

## 2019-06-03 DIAGNOSIS — T7800XD Anaphylactic reaction due to unspecified food, subsequent encounter: Secondary | ICD-10-CM | POA: Diagnosis not present

## 2019-06-03 DIAGNOSIS — J3089 Other allergic rhinitis: Secondary | ICD-10-CM

## 2019-06-03 MED ORDER — EPINEPHRINE 0.3 MG/0.3ML IJ SOAJ: 0.3000 mg | Freq: Once | INTRAMUSCULAR | 2 refills | Status: AC

## 2019-06-03 NOTE — Patient Instructions (Signed)
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 - Daily controller medication(s): Symbicort 160/4.5 1 puffs daily with spacer - Asthma action plan: increase Symbicort 2 puffs twice a day  - Rescue medications: ProAir 4 puffs every 4-6 hours as needed.   - Asthma control goals:  * Full participation in all desired activities (may need albuterol before activity) * Albuterol use two time or less a week on average (not counting use with activity) * Cough interfering with sleep two time or less a month * Oral steroids no more than once a year * No hospitalizations   Allergic rhinoconjunctivitis - Continue Astelin 1 spray twice daily. - Continue cetirizine 10mg  daily.  Adverse food reactions (peanuts, soy, shellfish) - Continue to avoid these foods. - EpiPen is up to date.    Return in about 6 months or sooner if needed

## 2019-06-03 NOTE — Progress Notes (Signed)
Follow-up Note  RE: Nicole Holland MRN: [id number] DOB: Mar 17, 1936 Date of Office Visit: 06/03/2019   History of present illness: Nicole Holland is a 83 y.o. female presenting today for follow-up of chronic rhinosinusitis with allergic rhinitis with conjunctivitis, asthma, and food allergy.  She was last seen in the office on December 31, 2018 by myself.  She states she is currently having some neck and shoulder pain after "sleeping the wrong way".  She otherwise states she did have a sinus infection with this fall where she did have sinus congestion and pressure, some wheezing as and was treated with prednisone.  This resolved her symptoms and she is doing much better.  She states she is only using her Symbicort 1 puff once a day at this time.  She states she did use her rescue inhaler when she had the respiratory illness but has not needed to use it at any other times.  She denies any nighttime awakenings.  She states she still use her Astelin twice a day to help control nasal drainage.  She also takes cetirizine daily.  She continues to avoid peanuts, soy and shellfish and has not had any accidental ingestions or need to use her epinephrine device.  Review of systems: Review of Systems  Constitutional: Negative for chills, fever and malaise/fatigue.  HENT: Negative for congestion, ear discharge, ear pain, nosebleeds, sinus pain and sore throat.   Eyes: Negative for pain, discharge and redness.  Respiratory: Negative.   Cardiovascular: Negative.   Gastrointestinal: Negative.   Musculoskeletal: Positive for neck pain.  Skin: Negative.   Neurological: Negative.     All other systems negative unless noted above in HPI  Past medical/social/surgical/family history have been reviewed and are unchanged unless specifically indicated below.  No changes  Medication List: Current Outpatient Medications  Medication Sig Dispense Refill  . albuterol (PROAIR HFA) 108 (90 Base) MCG/ACT inhaler  Inhale 2 puffs into the lungs every 6 (six) hours as needed for wheezing or shortness of breath. 18 g 3  . atorvastatin (LIPITOR) 20 MG tablet Take 20 mg by mouth at bedtime.  0  . azelastine (ASTELIN) 0.1 % nasal spray Place 1 spray into both nostrils 2 (two) times daily. Use in each nostril as directed 30 mL 5  . BLACK ELDERBERRY PO Take by mouth.    . budesonide-formoterol (SYMBICORT) 160-4.5 MCG/ACT inhaler Inhale 2 puffs into the lungs as needed. 1 Inhaler 5  . CARTIA XT 240 MG 24 hr capsule Take 240 mg by mouth daily.    . carvedilol (COREG) 6.25 MG tablet Take 12.5 mg by mouth 2 (two) times daily.  0  . cetirizine (ZYRTEC) 10 MG tablet TK 1 T PO QD    . diphenhydrAMINE (BENADRYL) 25 mg capsule Take by mouth.    . EPINEPHrine (EPIPEN 2-PAK) 0.3 mg/0.3 mL IJ SOAJ injection     . FEROSUL 325 (65 Fe) MG tablet Take 325 mg by mouth daily.    . fluorometholone (FML) 0.1 % ophthalmic suspension 1 drop 4 (four) times daily.    . furosemide (LASIX) 40 MG tablet Take 40 mg by mouth daily.  0  . glucose blood (ONE TOUCH ULTRA TEST) test strip TEST once daily    . guaiFENesin (MUCINEX) 600 MG 12 hr tablet Take 600 mg by mouth as needed.     . Lactobacillus (PROBIOTIC ACIDOPHILUS PO) Take by mouth.    . latanoprost (XALATAN) 0.005 % ophthalmic solution 1 drop at bedtime.    Marland Kitchen  Loperamide HCl 1 MG/7.5ML LIQD 1 Small Amount PO as directed    . losartan (COZAAR) 25 MG tablet Take 25 mg by mouth daily.  0  . mometasone (NASONEX) 50 MCG/ACT nasal spray Place 2 sprays into the nose daily. 17 g 5  . Multiple Vitamins-Minerals (CENTRUM SILVER PO) Take 1 tablet by mouth daily.     . mycophenolate (CELLCEPT) 500 MG tablet TAKE 2 TABLETS BY MOUTH TWICE DAILY 360 tablet 4  . olopatadine (PATANOL) 0.1 % ophthalmic solution instill 1 drop into both eyes daily 5 mL 1  . Olopatadine HCl (PATADAY) 0.2 % SOLN Apply 1 drop to eye as needed (itching).     . Omega-3 Fatty Acids (FISH OIL) 1200 MG CAPS Take 1,200 mg by  mouth 2 (two) times daily.     Glory Rosebush DELICA LANCETS [id number] MISC TEST ONCE DAILY AS DIRECTED    . Polyethylene Glycol 400 (BLINK TEARS) 0.25 % SOLN Apply 1 drop to eye as needed (dry eyes).     . vitamin C (ASCORBIC ACID) 500 MG tablet Take 500 mg by mouth daily.      Marland Kitchen EPINEPHrine 0.3 mg/0.3 mL IJ SOAJ injection Inject 0.3 mLs (0.3 mg total) into the muscle once for 1 dose. 4 each 2   No current facility-administered medications for this visit.     Known medication allergies: Allergies  Allergen Reactions  . Iodides Itching    TOTAL BODY  . Peanut Oil Other (See Comments)    Recent allergy testing  . Shellfish Allergy Other (See Comments)    Per allergy testing  . Soybean-Containing Drug Products Other (See Comments)    Per allergy testing  . Vaccinium Angustifolium Other (See Comments)    Per allergy testing  . Mestinon [Pyridostigmine Bromide] Diarrhea    "Stomach cramps, blood in stool"  . Penicillins Itching    Has patient had a PCN reaction causing immediate rash, facial/tongue/throat swelling, SOB or lightheadedness with hypotension: no-- pt did not have rash but was itchy all over body Has patient had a PCN reaction causing severe rash involving mucus membranes or skin necrosis: no Has patient had a PCN reaction that required hospitalization no Has patient had a PCN reaction occurring within the last 10 years: no If all of the above answers are "NO", then may proceed with Cephalosporin use.       Physical examination: Blood pressure 138/66, pulse 79, temperature 97.9 F (36.6 C), temperature source Temporal, resp. rate 16, height 4\' 11"  (1.499 m), weight 167 lb 9.6 oz (76 kg), SpO2 99 %.  General: Alert, interactive, in no acute distress. HEENT: PERRLA, TMs pearly gray, turbinates minimally edematous without discharge, post-pharynx non erythematous. Neck: Supple without lymphadenopathy. Lungs: Clear to auscultation without wheezing, rhonchi or rales. {no increased  work of breathing. CV: Normal S1, S2 without murmurs. Abdomen: Nondistended, nontender. Skin: Warm and dry, without lesions or rashes. Extremities:  No clubbing, cyanosis or edema. Neuro:   Grossly intact.  Diagnositics/Labs: Spirometry: FEV1: 0.93L 56%, FVC: 1.23L 58%, ratio consistent with restrictive pattern. 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 - Daily controller medication(s):  Symbicort 160/4.5 1 puffs daily with spacer - Asthma action plan: increase Symbicort 2 puffs twice a day  - Rescue medications: ProAir 4 puffs every 4-6 hours as needed.   - Asthma control goals:  * Full participation in all desired activities (may need albuterol before activity) * Albuterol use two time or less a week on average (not counting use with activity) * Cough interfering with sleep two time or less a month * Oral steroids no more than once a year * No hospitalizations   Allergic rhinitis with conjunctivitis - Continue Astelin 1 spray twice daily. - Continue cetirizine 10mg  daily.  Anaphylaxis due to food (peanuts, soy, shellfish) - Continue to avoid these foods. - EpiPen is up to date.    Return in about 6 months or sooner if needed  I appreciate the opportunity to take part in Tranisha's care. Please do not hesitate to contact me with questions.  Sincerely,   Prudy Feeler, MD Allergy/Immunology [facility] [place]

## 2019-08-26 ENCOUNTER — Ambulatory Visit: Payer: Medicare Other | Admitting: Family Medicine

## 2019-08-26 ENCOUNTER — Other Ambulatory Visit: Payer: Self-pay

## 2019-08-26 ENCOUNTER — Encounter: Payer: Self-pay | Admitting: Family Medicine

## 2019-08-26 VITALS — BP 132/74 | HR 74 | Temp 94.6°F | Ht 59.0 in | Wt 162.0 lb

## 2019-08-26 DIAGNOSIS — G7000 Myasthenia gravis without (acute) exacerbation: Secondary | ICD-10-CM | POA: Diagnosis not present

## 2019-08-26 DIAGNOSIS — Z79899 Other long term (current) drug therapy: Secondary | ICD-10-CM | POA: Diagnosis not present

## 2019-08-26 NOTE — Progress Notes (Addendum)
PATIENT: Nicole Holland DOB: Feb 10, 1936  REASON FOR VISIT: follow up HISTORY FROM: patient  Chief Complaint  Patient presents with  . Follow-up    Yearly f/u. Alone. Rm 5. No new concerns at this time.      HISTORY OF PRESENT ILLNESS: Today 08/26/19 Nicole Holland is a 84 y.o. female here today for follow up for occular MG.  She continues to do well with CellCept 1000 mg twice daily.  She denies any adverse effects with medications.  She denies any vision changes.  No double vision.  She denies eye drooping.  No changes in appetite or difficulty swallowing.  No weakness noted.  No changes in her.  She denies falls.  She is followed closely by primary care.  HISTORY: (copied from my note on 08/25/2018)  Interval history 08/25/2018: She is doing great. No symptoms. She is tolerating cell cept with no obvious adverse effects. She is trying to stay active. She is following up regularly with PCP, allergist, nephrologist and ophthalmologist for chronic disease management. CT chest negative for thymoma. She could not tolerate Mestinon.    Copied from Dr Cathren Laine note 02/24/2018: Interval history: here for follow up on myasthenia Gravis. Doing well on Cellcept. No symptoms even when she was sick. No diplopia, no difficulty swallowing, continue cellcept.  Interval history; Doing well, no double vision, feels improved. No side effects to the medication. Taking medication regularly. No SOB, no double vision, no weakness.   Interval 03/12/2017: Nicole Keatley Harbisonis a 84 y.o.femalehere as a referral from Dr. Dorna Bloom Myasthenia Gravis.ACHR binding antibody serum was positive. No double vision. Feels improved. She has finished her prednisone.  Will increase CellCept 1000 mg twice a day which is the preferred dose. Would prefer patient to come back in 3 months however she declines, so she does not want to come back for 6 months. We will do labs monthly until she sees me again. Discussed side  effects again and asked her to please call our office if there is any side effects whatsoever.  Interval History 12/09/2016:Nicole [name] a 84 y.o.femalehere as a referral from Dr. Dorna Bloom Myasthenia Gravis.ACHR binding antibody serum was positive. I had a long talk with her at last appointment however I don't the patient understands there may be memory problems. Asked her son to come along with her that she is here alone again today. I asked her to explain to me what is going on and she said "I really don't know what is going on". She had a long appointment with the ophthalmologist to discuss MG and also I spoke to her about MG but appears she has memory problems and a vague understanding.   [facility]  [id number] [name] a 84 y.o.femalehere as a referral from Dr. Dorna Bloom myogenic ptosis. She noticed eyelid drooping for a year. The eye symptoms don;t change with time of day, not better in the morning or evening. Pollen makes it worse, night driving is hard. No significant improvement after a nap. The left eye is worse. It is stable. People think she is asleep when she is not actually asleep. No double vision. No difficulty swallowing, breathing, no voice changing. Pollen season has been bad. No voice changes. Her legs try to give out on her but that's been "going on forever" no new muscle or new difficulties lifting arms overhead or climbing stairs. She can comb and style hair, brush teeth, can get out of seats. No neck  weakness.   Reviewed notes, labs and imaging from outside physicians, which showed:   Drooping of the right eye and left eye. Patient's [facility]. Eyelids have been drooping for the past year and getting worse, causing I was to feel heavy and rubs slightly as the eyes. She is not able to see as clearly as she used to. She feels is progressively getting worse. Eyes have been tearing and burning which makes vision  blurry sometimes. She can't see as clear as she would like to due to her eyelids drooping down in her eyes very bothersome. In driving at night has become a problem. Reviewed exam which showed a 20/60 in 2040 distance 20/50 in 20/30 near, patient was not dilated, intraocular pressure is normal, pupils equally round and reactive to light no APD, open angles, extraocular movements intact, myogenic ptosis, anterior chamber is normal, adnexa normal. They were referred here for EMG. Myasthenia panel was sent and a ACHR binding antibody serum was positive.   Reviewed labs: Receptor binding antibodies were elevated at 0.29 otherwise blocking antibodies and modulating antibodies were negative.    REVIEW OF SYSTEMS: Out of a complete 14 system review of symptoms, the patient complains only of the following symptoms, none and all other reviewed systems are negative.  ALLERGIES: Allergies  Allergen Reactions  . Iodides Itching    TOTAL BODY  . Peanut Oil Other (See Comments)    Recent allergy testing  . Shellfish Allergy Other (See Comments)    Per allergy testing  . Soybean-Containing Drug Products Other (See Comments)    Per allergy testing  . Vaccinium Angustifolium Other (See Comments)    Per allergy testing  . Mestinon [Pyridostigmine Bromide] Diarrhea    "Stomach cramps, blood in stool"  . Penicillins Itching    Has patient had a PCN reaction causing immediate rash, facial/tongue/throat swelling, SOB or lightheadedness with hypotension: no-- pt did not have rash but was itchy all over body Has patient had a PCN reaction causing severe rash involving mucus membranes or skin necrosis: no Has patient had a PCN reaction that required hospitalization no Has patient had a PCN reaction occurring within the last 10 years: no If all of the above answers are "NO", then may proceed with Cephalosporin use.      HOME MEDICATIONS: Outpatient Medications Prior to Visit  Medication Sig Dispense  Refill  . albuterol (PROAIR HFA) 108 (90 Base) MCG/ACT inhaler Inhale 2 puffs into the lungs every 6 (six) hours as needed for wheezing or shortness of breath. 18 g 3  . atorvastatin (LIPITOR) 20 MG tablet Take 20 mg by mouth at bedtime.  0  . azelastine (ASTELIN) 0.1 % nasal spray Place 1 spray into both nostrils 2 (two) times daily. Use in each nostril as directed 30 mL 5  . BLACK ELDERBERRY PO Take by mouth.    . budesonide-formoterol (SYMBICORT) 160-4.5 MCG/ACT inhaler Inhale 2 puffs into the lungs as needed. 1 Inhaler 5  . CARTIA XT 240 MG 24 hr capsule Take 240 mg by mouth daily.    . carvedilol (COREG) 6.25 MG tablet Take 12.5 mg by mouth 2 (two) times daily.  0  . cetirizine (ZYRTEC) 10 MG tablet TK 1 T PO QD    . diphenhydrAMINE (BENADRYL) 25 mg capsule Take by mouth.    . EPINEPHrine (EPIPEN 2-PAK) 0.3 mg/0.3 mL IJ SOAJ injection     . FEROSUL 325 (65 Fe) MG tablet Take 325 mg by mouth daily.    Marland Kitchen  fluorometholone (FML) 0.1 % ophthalmic suspension 1 drop 4 (four) times daily.    . furosemide (LASIX) 40 MG tablet Take 40 mg by mouth daily.  0  . glucose blood (ONE TOUCH ULTRA TEST) test strip TEST once daily    . guaiFENesin (MUCINEX) 600 MG 12 hr tablet Take 600 mg by mouth as needed.     . Lactobacillus (PROBIOTIC ACIDOPHILUS PO) Take by mouth.    . latanoprost (XALATAN) 0.005 % ophthalmic solution 1 drop at bedtime.    . Loperamide HCl 1 MG/7.5ML LIQD 1 Small Amount PO as directed    . losartan (COZAAR) 25 MG tablet Take 25 mg by mouth daily.  0  . mometasone (NASONEX) 50 MCG/ACT nasal spray Place 2 sprays into the nose daily. 17 g 5  . Multiple Vitamins-Minerals (CENTRUM SILVER PO) Take 1 tablet by mouth daily.     . mycophenolate (CELLCEPT) 500 MG tablet TAKE 2 TABLETS BY MOUTH TWICE DAILY 360 tablet 4  . olopatadine (PATANOL) 0.1 % ophthalmic solution instill 1 drop into both eyes daily 5 mL 1  . Olopatadine HCl (PATADAY) 0.2 % SOLN Apply 1 drop to eye as needed (itching).      . Omega-3 Fatty Acids (FISH OIL) 1200 MG CAPS Take 1,200 mg by mouth 2 (two) times daily.     Glory Rosebush DELICA LANCETS [id number] MISC TEST ONCE DAILY AS DIRECTED    . Polyethylene Glycol 400 (BLINK TEARS) 0.25 % SOLN Apply 1 drop to eye as needed (dry eyes).     . Turmeric 500 MG TABS Take 1 tablet by mouth daily with supper.    . vitamin C (ASCORBIC ACID) 500 MG tablet Take 500 mg by mouth daily.       No facility-administered medications prior to visit.    PAST MEDICAL HISTORY: Past Medical History:  Diagnosis Date  . Arthritis   . Asthma   . Breast cancer ([place]) 08/02/11   right low grade ductal ca in situ upper outer quad   . C. difficile diarrhea 2019  . Gout   . Hyperlipidemia   . Hypertension   . S/P radiation therapy 10/28/11 - 12/02/11   Right Breast/ 5000cGy/ 25 Fractions  . Sinus infection 2019  . Use of tamoxifen (Nolvadex) 12/04/11   Dr. Truddie Coco  . Vitamin D deficiency     PAST SURGICAL HISTORY: Past Surgical History:  Procedure Laterality Date  . ABDOMINAL HYSTERECTOMY  1994   partial ovaries intact  . BREAST LUMPECTOMY  08/02/11   Lumpectomy right Breast; Low Grade Ductal Carcinoma In Situ with Associated Calcifications, ER 65%, PR 99%  . BREAST SURGERY  02/01/2000   right breast subareolar core biopsy  . BREAST SURGERY  09/27/11   Right Breast; Excision of  Anterior Margin- Benign Breast Parenchyma, Negative for In situ and Invasive Cancer, Surgical Margins Negative, Benign skin, Negative for Malignancy  . CATARACT EXTRACTION, BILATERAL  2015, 2016  . CESAREAN SECTION    . TONSILLECTOMY      FAMILY HISTORY: Family History  Problem Relation Age of Onset  . Stroke Mother   . Diabetes Mother   . Cancer Mother   . Cancer Father        Lung cancer  . Diabetes Sister   . Cancer Sister        Unknown Cancer  . Diabetes Brother   . Cancer Brother        Unknown cancer  . Diabetes Brother   . Cancer  Brother        Unknown cancer  . Cancer Brother        Unknown  Cancer  . Diabetes Brother   . Cancer Brother        Unknown Cancer  . Autoimmune disease Neg Hx     SOCIAL HISTORY: Social History   Socioeconomic History  . Marital status: Married    Spouse name: separated, still married  . Number of children: 4  . Years of education: 28  . Highest education level: Not on file  Occupational History    Comment: NA  Tobacco Use  . Smoking status: Never Smoker  . Smokeless tobacco: Never Used  Substance and Sexual Activity  . Alcohol use: No  . Drug use: No  . Sexual activity: Never    Birth control/protection: Post-menopausal    Comment: mensus age 32,G5P4, 80st preg age 7, b.c short time,couldn't tolerate  Other Topics Concern  . Not on file  Social History Narrative   Lives alone   Caffeine- none   Right handed   Social Determinants of Health   Financial Resource Strain:   . Difficulty of Paying Living Expenses:   Food Insecurity:   . Worried About Charity fundraiser in the Last Year:   . Arboriculturist in the Last Year:   Transportation Needs:   . Film/video editor (Medical):   Marland Kitchen Lack of Transportation (Non-Medical):   Physical Activity:   . Days of Exercise per Week:   . Minutes of Exercise per Session:   Stress:   . Feeling of Stress :   Social Connections:   . Frequency of Communication with Friends and Family:   . Frequency of Social Gatherings with Friends and Family:   . Attends Religious Services:   . Active Member of Clubs or Organizations:   . Attends Archivist Meetings:   Marland Kitchen Marital Status:   Intimate Partner Violence:   . Fear of Current or Ex-Partner:   . Emotionally Abused:   Marland Kitchen Physically Abused:   . Sexually Abused:       PHYSICAL EXAM  Vitals:   08/26/19 1025  BP: 132/74  Pulse: 74  Temp: (!) 94.6 F (34.8 C)  TempSrc: Oral  Weight: 162 lb (73.5 kg)  Height: 4\' 11"  (1.499 m)   Body mass index is 32.72 kg/m.  Generalized: Well developed, in no acute distress   Cardiology: normal rate and rhythm, no murmur noted Neurological examination  Mentation: Alert oriented to time, place, history taking. Follows all commands speech and language fluent Cranial nerve II-XII: Pupils were equal round reactive to light. Extraocular movements were full, visual field were full on confrontational test. Facial sensation and strength were normal. Uvula tongue midline. Head turning and shoulder shrug  were normal and symmetric. No ptosis noted with 30 second gaze, patient denies diplopia.  Motor: The motor testing reveals 5 over 5 strength of all 4 extremities. Good symmetric motor tone is noted throughout.  Sensory: Sensory testing is intact to soft touch on all 4 extremities. No evidence of extinction is noted.  Coordination: Cerebellar testing reveals good finger-nose-finger and heel-to-shin bilaterally.  Gait and station: Gait is normal.    DIAGNOSTIC DATA (LABS, IMAGING, TESTING) - I reviewed patient records, labs, notes, testing and imaging myself where available.  No flowsheet data found.   Lab Results  Component Value Date   WBC 8.0 08/11/2017   HGB 10.1 (L) 08/11/2017   HCT 30.3 (  L) 08/11/2017   MCV 82 08/11/2017   PLT 203 08/11/2017      Component Value Date/Time   NA 143 08/11/2017 1025   NA 140 06/03/2014 1001   K 3.7 08/11/2017 1025   K 4.0 06/03/2014 1001   CL 102 08/11/2017 1025   CL 103 04/23/2012 1023   CO2 25 08/11/2017 1025   CO2 26 06/03/2014 1001   GLUCOSE 118 (H) 08/11/2017 1025   GLUCOSE 74 09/04/2015 0527   GLUCOSE 105 06/03/2014 1001   GLUCOSE 130 (H) 04/23/2012 1023   BUN 29 (H) 08/11/2017 1025   BUN 23.6 06/03/2014 1001   CREATININE 1.88 (H) 08/11/2017 1025   CREATININE 1.7 (H) 06/03/2014 1001   CALCIUM 9.7 08/11/2017 1025   CALCIUM 10.5 (H) 06/03/2014 1001   PROT 6.4 08/11/2017 1025   PROT 6.9 06/03/2014 1001   ALBUMIN 4.0 08/11/2017 1025   ALBUMIN 3.6 06/03/2014 1001   AST 16 08/11/2017 1025   AST 62 (H) 06/03/2014  1001   ALT 9 08/11/2017 1025   ALT 62 (H) 06/03/2014 1001   ALKPHOS 71 08/11/2017 1025   ALKPHOS 54 06/03/2014 1001   BILITOT 0.6 08/11/2017 1025   BILITOT 0.59 06/03/2014 1001   GFRNONAA 25 (L) 08/11/2017 1025   GFRAA 29 (L) 08/11/2017 1025   No results found for: CHOL, HDL, LDLCALC, LDLDIRECT, TRIG, CHOLHDL No results found for: HGBA1C No results found for: VITAMINB12 No results found for: TSH     ASSESSMENT AND PLAN 84 y.o. year old female  has a past medical history of Arthritis, Asthma, Breast cancer ([place]) (08/02/11), C. difficile diarrhea (2019), Gout, Hyperlipidemia, Hypertension, S/P radiation therapy (10/28/11 - 12/02/11), Sinus infection (2019), Use of tamoxifen (Nolvadex) (12/04/11), and Vitamin D deficiency. here with     ICD-10-CM   1. Myasthenia gravis ([place])  G70.00   2. Long-term use of high-risk medication  Z79.899     Alphonso is doing very well on CellCept 1000 mg twice daily.  She is followed by ophthalmology closely.  There have been no visual disturbances or vision changes.  She is tolerating medication well with no obvious adverse effects.  We will continue current therapy.  She is followed closely by primary care and nephrology. Labs are performed regularly.  She was advised to stay well-hydrated, eat a healthy well-balanced diet and try to stay physically active.  She will follow-up in 1 year, sooner if needed.  She verbalizes understanding and agreement with this plan.   No orders of the defined types were placed in this encounter.    No orders of the defined types were placed in this encounter.     I spent 20 minutes with the patient. 50% of this time was spent counseling and educating patient on plan of care and medications.    Debbora Presto, FNP-C 08/26/2019, 1:09 PM [facility] [address], [place], [place] [address] [phone]  Made any corrections needed, and agree with history, physical, neuro exam,assessment and plan as  stated.     Sarina Ill, MD [facility]

## 2019-08-26 NOTE — Patient Instructions (Addendum)
Continue Cellcept 1000mg  twice daily   Follow up closely with PCP and ophthalmology.   Follow up with [place] in 1 year.    Myasthenia Gravis Myasthenia gravis (MG) is a long-term (chronic) condition that causes weakness in the muscles you can control (voluntary muscles). MG can affect any voluntary muscle. The muscles most often affected are the ones that control:  Eye movement.  Facial movements.  Swallowing. MG is a disease in which the body's disease-fighting system (immune system) attacks its own healthy tissues (autoimmune disease). When you have MG, your immune system makes proteins (antibodies) that block the chemical (acetylcholine) that your body needs to send nerve signals to your muscles. This causes muscle weakness. What are the causes? The exact cause of MG is not known. What increases the risk? The following factors may make you more likely to develop this condition:  Having an enlarged thymus gland. The thymus gland is located under the breastbone. It makes certain cells for the immune system.  Having a family history of MG. What are the signs or symptoms? Symptoms of MG may include:  Drooping eyelids.  Double vision.  Muscle weakness that gets worse with activity and gets better after rest.  Difficulty walking.  Trouble chewing and swallowing.  Trouble making facial expressions.  Slurred speech.  Weakness of the arms, hands, and legs. Sudden, severe difficulty breathing (myasthenic crisis) may develop after having:  An infection.  A fever.  A bad reaction to a medicine. Myasthenic crisis requires emergency breathing support. Sometimes symptoms of MG go away for a while (remission) and then come back later. How is this diagnosed? This condition may be diagnosed based on:  Your symptoms and medical history.  A physical exam.  Blood tests.  Tests of your muscle strength and function.  Imaging tests, such as a CT scan or an MRI. How is this  treated? The goal of treatment is to improve muscle strength. Treatment may include:  Taking medicine.  Making lifestyle changes that focus on saving your energy.  Doing physical therapy to gain strength.  Having surgery to remove the thymus gland (thymectomy). This may result in a long remission for some people.  Having a procedure to remove the acetylcholine antibodies (plasmapheresis).  Getting emergency breathing support, if you experience myasthenic crisis. If you experience remission, you may be able to stop treatment and then resume treatment when your symptoms return. Follow these instructions at home:   Take over-the-counter and prescription medicines only as told by your health care provider.  Get plenty of rest and sleep. Take frequent breaks to rest your eyes, especially when in bright light or working on a computer.  Maintain a healthy diet and a healthy weight. Work with your health care provider or a diet and nutrition specialist (dietitian) if you need help.  Do exercises as told by your health care provider or physical therapist.  Do not use any products that contain nicotine or tobacco, such as cigarettes and e-cigarettes. If you need help quitting, ask your health care provider.  Prevent infections by: ? Washing your hands often with soap and water. If soap and water are not available, use hand sanitizer. ? Avoiding contact with other people who are sick. ? Avoiding touching your eyes, nose, and mouth. ? Cleaning surfaces in your home that are touched often using a disinfectant.  Keep all follow-up visits as told by your health care provider. This is important. Contact a health care provider if:  Your symptoms change  or get worse, especially after having a fever or infection. Get help right away if:  You have trouble breathing. Summary  Myasthenia gravis (MG) is a long-term (chronic) condition that causes weakness in the muscles you can control (voluntary  muscles).  A symptom of MG is muscle weakness that gets worse with activity and gets better after rest.  Sudden, severe difficulty breathing (myasthenic crisis) may develop after having an infection, a fever, or a bad reaction to a medicine.  The goal of treatment is to improve muscle strength. Treatment may include medicines, lifestyle changes, physical therapy, surgery, plasmapheresis, or emergency breathing support. This information is not intended to replace advice given to you by your health care provider. Make sure you discuss any questions you have with your health care provider. Document Revised: 06/16/2017 Document Reviewed: 06/16/2017 Elsevier Patient Education  2020 [employer].

## 2019-12-02 ENCOUNTER — Other Ambulatory Visit: Payer: Self-pay

## 2019-12-02 ENCOUNTER — Ambulatory Visit: Payer: Medicare Other | Admitting: Allergy

## 2019-12-02 VITALS — BP 154/70 | HR 71 | Resp 16 | Ht 59.0 in

## 2019-12-02 DIAGNOSIS — T7800XD Anaphylactic reaction due to unspecified food, subsequent encounter: Secondary | ICD-10-CM | POA: Diagnosis not present

## 2019-12-02 DIAGNOSIS — J328 Other chronic sinusitis: Secondary | ICD-10-CM

## 2019-12-02 DIAGNOSIS — J3089 Other allergic rhinitis: Secondary | ICD-10-CM | POA: Diagnosis not present

## 2019-12-02 DIAGNOSIS — J4540 Moderate persistent asthma, uncomplicated: Secondary | ICD-10-CM | POA: Diagnosis not present

## 2019-12-02 NOTE — Patient Instructions (Addendum)
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   - Asthma control goals:  * Full participation in all desired activities (may need albuterol before activity) * Albuterol use two time or less a week on average (not counting use with activity) * Cough interfering with sleep two time or less a month * Oral steroids no more than once a year * No hospitalizations   Allergic rhinoconjunctivitis - Continue Astelin 1 spray twice daily. - Continue cetirizine 10mg  daily.  Adverse food reactions (peanuts, soy, shellfish) - Continue to avoid these foods. - EpiPen is up to date.    Return in about 6 months or sooner if needed

## 2019-12-02 NOTE — Progress Notes (Signed)
Follow-up Note  RE: Nicole Holland MRN: [id number] DOB: 1936/04/07 Date of Office Visit: 12/02/2019   History of present illness: Nicole Holland is a 84 y.o. female presenting today for follow-up of asthma, allergic rhinitis with conjunctivitis, food allergy and history of chronic sinusitis.  She was last seen in the office on 06/03/2019 myself. She states over the past week or so she felt like she was having more chest congestion, cough and and wheezing and needing to use her inhaler medicines more.  She did see her PCP in regards to this and was provided with a steroid Dosepak that she yesterday.  She states she does feel better and the congestion is improved.  She states she does not want to go on anymore prednisone.  She is using her Symbicort 160 mg 1 puff twice a day.  She states she is not need to use her rescue inhaler since completion of the steroid pack. With her allergies she states she has been having more watery eyes, stuffy and a runny nose.  This too is somewhat improved with the steroid pack.  She does continue to take her cetirizine daily as well as using Astelin daily. She continues to avoid peanuts, soy and shellfish without any accidental ingestions or need to use her epinephrine device. She denies having any antibiotics since her last visit. She states that she is doing very well for her age.  Review of systems: Review of Systems  Constitutional: Negative.   HENT: Positive for congestion.   Eyes:       Watery eyes  Respiratory: Positive for cough, shortness of breath and wheezing.   Cardiovascular: Negative.   Gastrointestinal: Negative.   Musculoskeletal: Negative.   Skin: Negative.   Neurological: Negative.     All other systems negative unless noted above in HPI  Past medical/social/surgical/family history have been reviewed and are unchanged unless specifically indicated below.  No changes  Medication List: Current Outpatient Medications  Medication  Sig Dispense Refill  . albuterol (PROAIR HFA) 108 (90 Base) MCG/ACT inhaler Inhale 2 puffs into the lungs every 6 (six) hours as needed for wheezing or shortness of breath. 54 g 0  . atorvastatin (LIPITOR) 20 MG tablet Take 20 mg by mouth at bedtime.  0  . azelastine (ASTELIN) 0.1 % nasal spray Place 1 spray into both nostrils 2 (two) times daily. Use in each nostril as directed 30 mL 5  . BLACK ELDERBERRY PO Take by mouth.    . budesonide-formoterol (SYMBICORT) 160-4.5 MCG/ACT inhaler Inhale 2 puffs into the lungs as needed. 30.6 g 1  . calcium carbonate (OSCAL) 1500 (600 Ca) MG TABS tablet Take by mouth.    [name] XT 240 MG 24 hr capsule Take 240 mg by mouth daily.    . carvedilol (COREG) 6.25 MG tablet Take 12.5 mg by mouth 2 (two) times daily.  0  . cetirizine (ZYRTEC) 10 MG tablet TK 1 T PO QD    . Cholecalciferol 25 MCG (1000 UT) capsule Take by mouth.    . diphenhydrAMINE (BENADRYL) 25 mg capsule Take by mouth.    . EPINEPHrine (EPIPEN 2-PAK) 0.3 mg/0.3 mL IJ SOAJ injection     . FEROSUL 325 (65 Fe) MG tablet Take 325 mg by mouth daily.    . fluorometholone (FML) 0.1 % ophthalmic suspension 1 drop 4 (four) times daily.    . furosemide (LASIX) 40 MG tablet Take 40 mg by mouth daily.  0  . glucose  blood (ONE TOUCH ULTRA TEST) test strip TEST once daily    . guaiFENesin (MUCINEX) 600 MG 12 hr tablet Take 600 mg by mouth as needed.     . Lactobacillus (PROBIOTIC ACIDOPHILUS PO) Take by mouth.    . latanoprost (XALATAN) 0.005 % ophthalmic solution 1 drop at bedtime.    . Loperamide HCl 1 MG/7.5ML LIQD 1 Small Amount PO as directed    . losartan (COZAAR) 25 MG tablet Take 25 mg by mouth daily.  0  . mometasone (NASONEX) 50 MCG/ACT nasal spray Place 2 sprays into the nose daily. 17 g 5  . Multiple Vitamins-Minerals (CENTRUM SILVER PO) Take 1 tablet by mouth daily.     . mycophenolate (CELLCEPT) 500 MG tablet TAKE 2 TABLETS BY MOUTH TWICE DAILY 360 tablet 4  . Olopatadine HCl (PATADAY) 0.2  % SOLN Apply 1 drop to eye as needed (itching).     . Omega-3 Fatty Acids (FISH OIL) 1200 MG CAPS Take 1,200 mg by mouth 2 (two) times daily.     Glory Rosebush DELICA LANCETS 32G MISC TEST ONCE DAILY AS DIRECTED    . Polyethylene Glycol 400 (BLINK TEARS) 0.25 % SOLN Apply 1 drop to eye as needed (dry eyes).     . Turmeric 500 MG TABS Take 1 tablet by mouth daily with supper.    . vitamin C (ASCORBIC ACID) 500 MG tablet Take 500 mg by mouth daily.       No current facility-administered medications for this visit.     Known medication allergies: Allergies  Allergen Reactions  . Iodides Itching    TOTAL BODY  . Peanut Oil Other (See Comments)    Recent allergy testing  . Shellfish Allergy Other (See Comments)    Per allergy testing  . Soybean-Containing Drug Products Other (See Comments)    Per allergy testing  . Vaccinium Angustifolium Other (See Comments)    Per allergy testing  . Mestinon [Pyridostigmine Bromide] Diarrhea    "Stomach cramps, blood in stool"  . Penicillins Itching    Has patient had a PCN reaction causing immediate rash, facial/tongue/throat swelling, SOB or lightheadedness with hypotension: no-- pt did not have rash but was itchy all over body Has patient had a PCN reaction causing severe rash involving mucus membranes or skin necrosis: no Has patient had a PCN reaction that required hospitalization no Has patient had a PCN reaction occurring within the last 10 years: no If all of the above answers are "NO", then may proceed with Cephalosporin use.       Physical examination: Blood pressure (!) 154/70, pulse 71, resp. rate 16, height 4\' 11"  (1.499 m), SpO2 98 %.  General: Alert, interactive, in no acute distress. HEENT: PERRLA, TMs pearly gray, turbinates minimally edematous without discharge, post-pharynx non erythematous. Neck: Supple without lymphadenopathy. Lungs: Clear to auscultation without wheezing, rhonchi or rales. {no increased work of  breathing. CV: Normal S1, S2 without murmurs. Abdomen: Nondistended, nontender. Skin: Warm and dry, without lesions or rashes. Extremities:  No clubbing, cyanosis or edema. Neuro:   Grossly intact.  Diagnositics/Labs:  Spirometry: FEV1: 1.0 L 70%, FVC: 1.47 L 76%, ratio consistent with For age this is a normal pattern.  This is much improved from her previous study.   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   - Asthma control goals:  * Full participation in all desired activities (may need albuterol before activity) * Albuterol use two time or less a week on average (not counting use with activity) * Cough interfering with sleep two time or less a month * Oral steroids no more than once a year * No hospitalizations   Allergic rhinoconjunctivitis - Continue Astelin 1 spray twice daily. - Continue cetirizine 10mg  daily.  Adverse food reactions (peanuts, soy, shellfish) - Continue to avoid these foods. - EpiPen is up to date.    Return in about 6 months or sooner if needed   I appreciate the opportunity to take part in Jailin's care. Please do not hesitate to contact me with questions.  Sincerely,   Prudy Feeler, MD Allergy/Immunology [facility] [place]

## 2019-12-03 ENCOUNTER — Encounter: Payer: Self-pay | Admitting: Allergy

## 2019-12-03 MED ORDER — ALBUTEROL SULFATE HFA 108 (90 BASE) MCG/ACT IN AERS: 2.0000 | INHALATION_SPRAY | Freq: Four times a day (QID) | RESPIRATORY_TRACT | 0 refills | Status: DC | PRN

## 2019-12-03 MED ORDER — BUDESONIDE-FORMOTEROL FUMARATE 160-4.5 MCG/ACT IN AERO: 2.0000 | INHALATION_SPRAY | RESPIRATORY_TRACT | 1 refills | Status: DC | PRN

## 2020-01-15 ENCOUNTER — Other Ambulatory Visit: Payer: Self-pay | Admitting: Allergy

## 2020-01-17 NOTE — Telephone Encounter (Signed)
Patient called to make sure we received her refill request.  Please advise.  Thanks

## 2020-01-17 NOTE — Telephone Encounter (Signed)
Refill was sent today.

## 2020-05-31 ENCOUNTER — Other Ambulatory Visit: Payer: Self-pay | Admitting: Allergy

## 2020-05-31 ENCOUNTER — Other Ambulatory Visit: Payer: Self-pay | Admitting: Neurology

## 2020-05-31 DIAGNOSIS — G7000 Myasthenia gravis without (acute) exacerbation: Secondary | ICD-10-CM

## 2020-05-31 DIAGNOSIS — Z79899 Other long term (current) drug therapy: Secondary | ICD-10-CM

## 2020-06-02 ENCOUNTER — Ambulatory Visit: Payer: Medicare Other | Admitting: Allergy

## 2020-06-02 ENCOUNTER — Encounter: Payer: Self-pay | Admitting: Allergy

## 2020-06-02 ENCOUNTER — Other Ambulatory Visit: Payer: Self-pay

## 2020-06-02 VITALS — BP 122/72 | HR 71 | Temp 98.1°F | Resp 16

## 2020-06-02 DIAGNOSIS — J3089 Other allergic rhinitis: Secondary | ICD-10-CM

## 2020-06-02 DIAGNOSIS — T7800XD Anaphylactic reaction due to unspecified food, subsequent encounter: Secondary | ICD-10-CM | POA: Diagnosis not present

## 2020-06-02 DIAGNOSIS — J4540 Moderate persistent asthma, uncomplicated: Secondary | ICD-10-CM

## 2020-06-02 DIAGNOSIS — J328 Other chronic sinusitis: Secondary | ICD-10-CM | POA: Diagnosis not present

## 2020-06-02 MED ORDER — EPINEPHRINE 0.3 MG/0.3ML IJ SOAJ: 0.3000 mg | INTRAMUSCULAR | 1 refills | Status: DC | PRN

## 2020-06-02 MED ORDER — BUDESONIDE-FORMOTEROL FUMARATE 160-4.5 MCG/ACT IN AERO: INHALATION_SPRAY | RESPIRATORY_TRACT | 5 refills | Status: DC

## 2020-06-02 MED ORDER — AZELASTINE HCL 0.1 % NA SOLN: NASAL | 5 refills | Status: DC

## 2020-06-02 NOTE — Patient Instructions (Signed)
Moderate persistent asthma - Daily controller medication(s): Symbicort 160/4.5 2 puffs daily with spacer - Rescue medications: ProAir 4 puffs every 4-6 hours as needed.   - Asthma control goals:  * Full participation in all desired activities (may need albuterol before activity) * Albuterol use two time or less a week on average (not counting use with activity) * Cough interfering with sleep two time or less a month * Oral steroids no more than once a year * No hospitalizations   Allergic rhinoconjunctivitis - Continue Astelin 1 spray twice daily. - Continue cetirizine 10mg  daily.  Adverse food reactions (peanuts, soy, shellfish) - Continue to avoid these foods. - EpiPen is up to date.    Return in about 6 months or sooner if needed

## 2020-06-02 NOTE — Progress Notes (Signed)
Follow-up Note  RE: Nicole Holland MRN: [id number] DOB: [birth year] Date of Office Visit: 06/02/2020   History of present illness: Nicole Holland is a 84 y.o. female presenting today for follow-up of asthma, allergic rhinitis with conjunctivitis and food allergy.  She was last seen in the office on 12/02/2019 by myself.  She states with the changes in the weather she is having more issues with her asthma and her allergies.  She states she has had more watery eyes and nose and coughing clear phlegm over the past week.  She states she used her albuterol this morning due to some chest congestion.  She does continue on Symbicort taking 2 puffs twice a day.  She has not required any ED visits or urgent care visits for asthma or any systemic steroid needs.  She is using her albuterol less than twice a week on average. She states the Astelin still helps in controlling her nasal drainage and she uses it daily As well as her cetirizine. She continues to avoid peanuts, soy and shellfish without any accidental ingestions or need to use her epinephrine device but she does need a refill. She has had all her vaccinations including her third dose of the Covid vaccine.  She has received her flu vaccine this season and received her pneumonia vaccine last week.     Review of systems: Review of Systems  Constitutional: Negative.   HENT:       See HPI  Eyes:       See HPI  Respiratory: Negative for shortness of breath and wheezing.        See HPI  Cardiovascular: Negative.   Gastrointestinal: Negative.   Musculoskeletal: Negative.   Skin: Negative.   Neurological: Negative.     All other systems negative unless noted above in HPI  Past medical/social/surgical/family history have been reviewed and are unchanged unless specifically indicated below.  No changes  Medication List: Current Outpatient Medications  Medication Sig Dispense Refill  . albuterol (PROAIR HFA) 108 (90 Base) MCG/ACT inhaler  Inhale 2 puffs into the lungs every 6 (six) hours as needed for wheezing or shortness of breath. 54 g 0  . atorvastatin (LIPITOR) 20 MG tablet Take 20 mg by mouth at bedtime.  0  . azelastine (ASTELIN) 0.1 % nasal spray INSTILL 1 SPRAY IN EACH NOSTRIL TWICE DAILY AS DIRECTED 30 mL 0  . BLACK ELDERBERRY PO Take by mouth.    . calcium carbonate (OSCAL) 1500 (600 Ca) MG TABS tablet Take by mouth.    [name] XT 240 MG 24 hr capsule Take 240 mg by mouth daily.    . carvedilol (COREG) 6.25 MG tablet Take 12.5 mg by mouth 2 (two) times daily.  0  . cetirizine (ZYRTEC) 10 MG tablet TK 1 T PO QD    . Cholecalciferol 25 MCG (1000 UT) capsule Take by mouth.    . diphenhydrAMINE (BENADRYL) 25 mg capsule Take by mouth.    . EPINEPHrine 0.3 mg/0.3 mL IJ SOAJ injection     . FEROSUL 325 (65 Fe) MG tablet Take 325 mg by mouth daily.    . fluorometholone (FML) 0.1 % ophthalmic suspension 1 drop 4 (four) times daily.    . furosemide (LASIX) 40 MG tablet Take 40 mg by mouth daily.  0  . glucose blood test strip TEST once daily    . guaiFENesin (MUCINEX) 600 MG 12 hr tablet Take 600 mg by mouth as needed.     Marland Kitchen  Lactobacillus (PROBIOTIC ACIDOPHILUS PO) Take by mouth.    . latanoprost (XALATAN) 0.005 % ophthalmic solution 1 drop at bedtime.    . Loperamide HCl 1 MG/7.5ML LIQD 1 Small Amount PO as directed    . losartan (COZAAR) 25 MG tablet Take 25 mg by mouth daily.  0  . mometasone (NASONEX) 50 MCG/ACT nasal spray Place 2 sprays into the nose daily. 17 g 5  . Multiple Vitamins-Minerals (CENTRUM SILVER PO) Take 1 tablet by mouth daily.    . mycophenolate (CELLCEPT) 500 MG tablet TAKE 2 TABLETS BY MOUTH TWICE DAILY 360 tablet 0  . Olopatadine HCl 0.2 % SOLN Apply 1 drop to eye as needed (itching).     . Omega-3 Fatty Acids (FISH OIL) 1200 MG CAPS Take 1,200 mg by mouth 2 (two) times daily.    Glory Rosebush DELICA LANCETS 29H MISC TEST ONCE DAILY AS DIRECTED    . Polyethylene Glycol 400 0.25 % SOLN Apply 1 drop to  eye as needed (dry eyes).    . SYMBICORT 160-4.5 MCG/ACT inhaler INHALE 2 PUFFS INTO THE LUNGS DAILY AS NEEDED 18 g 0  . Turmeric 500 MG TABS Take 1 tablet by mouth daily with supper.    . vitamin C (ASCORBIC ACID) 500 MG tablet Take 500 mg by mouth daily.     No current facility-administered medications for this visit.     Known medication allergies: Allergies  Allergen Reactions  . Iodides Itching    TOTAL BODY  . Peanut Oil Other (See Comments)    Recent allergy testing  . Shellfish Allergy Other (See Comments)    Per allergy testing  . Soybean-Containing Drug Products Other (See Comments)    Per allergy testing  . Vaccinium Angustifolium Other (See Comments)    Per allergy testing  . Mestinon [Pyridostigmine Bromide] Diarrhea    "Stomach cramps, blood in stool"  . Penicillins Itching    Has patient had a PCN reaction causing immediate rash, facial/tongue/throat swelling, SOB or lightheadedness with hypotension: no-- pt did not have rash but was itchy all over body Has patient had a PCN reaction causing severe rash involving mucus membranes or skin necrosis: no Has patient had a PCN reaction that required hospitalization no Has patient had a PCN reaction occurring within the last 10 years: no If all of the above answers are "NO", then may proceed with Cephalosporin use.       Physical examination: Blood pressure 122/72, pulse 71, temperature 98.1 F (36.7 C), temperature source Temporal, resp. rate 16, SpO2 98 %.  General: Alert, interactive, in no acute distress. HEENT: PERRLA, TMs pearly gray, turbinates minimally edematous with clear discharge, post-pharynx non erythematous. Neck: Supple without lymphadenopathy. Lungs: Clear to auscultation without wheezing, rhonchi or rales. {no increased work of breathing. CV: Normal S1, S2 without murmurs. Abdomen: Nondistended, nontender. Skin: Warm and dry, without lesions or rashes. Extremities:  No clubbing, cyanosis or  edema. Neuro:   Grossly intact.  Diagnositics/Labs: None today  Assessment and plan:   Moderate persistent asthma - Daily controller medication(s): Symbicort 160/4.5 2 puffs daily with spacer - Rescue medications: ProAir 4 puffs every 4-6 hours as needed.   - Asthma control goals:  * Full participation in all desired activities (may need albuterol before activity) * Albuterol use two time or less a week on average (not counting use with activity) * Cough interfering with sleep two time or less a month * Oral steroids no more than once a year * No  hospitalizations   Allergic rhinoconjunctivitis with chronic sinusitis - Continue Astelin 1 spray twice daily. - Continue cetirizine 10mg  daily.  Adverse food reactions (peanuts, soy, shellfish) - Continue to avoid these foods. - EpiPen is up to date.    Return in about 6 months or sooner if needed  I appreciate the opportunity to take part in Kharma's care. Please do not hesitate to contact me with questions.  Sincerely,   Prudy Feeler, MD Allergy/Immunology [facility] [place]

## 2020-08-14 HISTORY — PX: OTHER SURGICAL HISTORY: SHX169

## 2020-08-28 ENCOUNTER — Ambulatory Visit (INDEPENDENT_AMBULATORY_CARE_PROVIDER_SITE_OTHER): Payer: Medicare Other | Admitting: Neurology

## 2020-08-28 ENCOUNTER — Encounter: Payer: Self-pay | Admitting: Neurology

## 2020-08-28 VITALS — BP 140/62 | HR 71 | Ht 59.0 in | Wt 160.0 lb

## 2020-08-28 DIAGNOSIS — G7000 Myasthenia gravis without (acute) exacerbation: Secondary | ICD-10-CM

## 2020-08-28 DIAGNOSIS — Z79899 Other long term (current) drug therapy: Secondary | ICD-10-CM | POA: Diagnosis not present

## 2020-08-28 MED ORDER — MYCOPHENOLATE MOFETIL 500 MG PO TABS: 1000.0000 mg | ORAL_TABLET | Freq: Two times a day (BID) | ORAL | 3 refills | Status: DC

## 2020-08-28 NOTE — Progress Notes (Signed)
[facility]    Provider:  Dr [name] Referring Provider: Elinor Parkinson Primary Care Physician:  Sue Lush, PA-C  CC:  Myasthenia Gravis, predominantly ocular  Interval history 08/28/2020; She has been on Cellcept for ocular myasthenia gravis. Not generalized. She Doesn't want to take the cellcept anymore. We discussed if she has ptosis or weakness or difficulty breathing or any other issues need to see me right away or if having difficulty with walking or breathing she should go to the ED, risk of exacerbation  05/2020: CBC with stable anemia, CMP creatinine 1.73 otherwise   Interval history: here for follow up on myasthenia Gravis. Doing well on Cellcept. No symptoms even when she was sick. No diplopia, no difficulty swallowing, continue cellcept.  Interval history; Doing well, no double vision, feels improved. No side effects to the medication. Taking medication regularly. No SOB, no double vision, no weakness.   Interval 03/12/2017:  Nicole Holland is a 85 y.o. female here as a referral from Dr. Lavone Neri for Myasthenia Gravis.ACHR binding antibody serum was positive. No double vision. Feels improved. She has finished her prednisone.  Will increase CellCept 1000 mg twice a day which is the preferred dose. Would prefer patient to come back in 3 months however she declines, so she does not want to come back for 6 months. We will do labs monthly until she sees me again. Discussed side effects again and asked her to please call our office if there is any side effects whatsoever.  Interval History 12/09/2016:  Nicole Holland is a 85 y.o. female here as a referral from Dr. Lavone Neri for Myasthenia Gravis.ACHR binding antibody serum was positive.  I had a long talk with her at last appointment however I don't the patient understands there may be memory problems. Asked her son to come along with her that she is here alone again today. I asked her to explain to me what is  going on and she said "I really don't know what is going on". She had a long appointment with the ophthalmologist to discuss MG and also I spoke to her about MG but appears she has memory problems and a vague understanding.   [facility]  QIO:NGEXB [name] a 85 y.o.femalehere as a referral from Dr. Dorna Bloom myogenic ptosis. She noticed eyelid drooping for a year. The eye symptoms don;t change with time of day, not better in the morning or evening. Pollen makes it worse, night driving is hard. No significant improvement after a nap. The left eye is worse. It is stable. People think she is asleep when she is not actually asleep. No double vision. No difficulty swallowing, breathing, no voice changing. Pollen season has been bad. No voice changes. Her legs try to give out on her but that's been "going on forever" no new muscle or new difficulties lifting arms overhead or climbing stairs. She can comb and style hair, brush teeth, can get out of seats. No neck weakness.   Reviewed notes, labs and imaging from outside physicians, which showed:   Drooping of the right eye and left eye. Patient's [facility]. Eyelids have been drooping for the past year and getting worse, causing I was to feel heavy and rubs slightly as the eyes. She is not able to see as clearly as she used to. She feels is progressively getting worse. Eyes have been tearing and burning which makes vision blurry sometimes. She can't see as clear  as she would like to due to her eyelids drooping down in her eyes very bothersome. In driving at night has become a problem. Reviewed exam which showed a 20/60 in 2040 distance 20/50 in 20/30 near, patient was not dilated, intraocular pressure is normal, pupils equally round and reactive to light no APD, open angles, extraocular movements intact, myogenic ptosis, anterior chamber is normal, adnexa normal. They were referred here for EMG.  Myasthenia panel was sent and a ACHR binding antibody serum was positive.   Reviewed labs: Receptor binding antibodies were elevated at 0.29 otherwise blocking antibodies and modulating antibodies were negative.  Review of Systems: Patient complains of symptoms per HPI as well as the following symptoms: no SOB, no CP. Pertinent negatives per HPI. All others negative.    Social History   Socioeconomic History  . Marital status: Married    Spouse name: separated, still married  . Number of children: 4  . Years of education: 39  . Highest education level: Not on file  Occupational History    Comment: NA  Tobacco Use  . Smoking status: Never Smoker  . Smokeless tobacco: Never Used  Vaping Use  . Vaping Use: Never used  Substance and Sexual Activity  . Alcohol use: No  . Drug use: No  . Sexual activity: Never    Birth control/protection: Post-menopausal    Comment: mensus age 77,G5P4, 57st preg age 86, b.c short time,couldn't tolerate  Other Topics Concern  . Not on file  Social History Narrative   Lives alone   Caffeine- none   Right handed   Social Determinants of Health   Financial Resource Strain: Not on file  Food Insecurity: Not on file  Transportation Needs: Not on file  Physical Activity: Not on file  Stress: Not on file  Social Connections: Not on file  Intimate Partner Violence: Not on file    Family History  Problem Relation Age of Onset  . Stroke Mother   . Diabetes Mother   . Cancer Mother   . Cancer Father        Lung cancer  . Diabetes Sister   . Cancer Sister        Unknown Cancer  . Diabetes Brother   . Cancer Brother        Unknown cancer  . Diabetes Brother   . Cancer Brother        Unknown cancer  . Cancer Brother        Unknown Cancer  . Diabetes Brother   . Cancer Brother        Unknown Cancer  . Autoimmune disease Neg Hx     Past Medical History:  Diagnosis Date  . Arthritis   . Asthma   . Breast cancer ([place]) 08/02/11    right low grade ductal ca in situ upper outer quad   . C. difficile diarrhea 2019  . Gout   . Hyperlipidemia   . Hypertension   . S/P radiation therapy 10/28/11 - 12/02/11   Right Breast/ 5000cGy/ 25 Fractions  . Sinus infection 2019  . Use of tamoxifen (Nolvadex) 12/04/11   Dr. Truddie Coco  . Vitamin D deficiency     Past Surgical History:  Procedure Laterality Date  . ABDOMINAL HYSTERECTOMY  1994   partial ovaries intact  . BREAST LUMPECTOMY  08/02/11   Lumpectomy right Breast; Low Grade Ductal Carcinoma In Situ with Associated Calcifications, ER 65%, PR 99%  . BREAST SURGERY  02/01/2000   right breast  subareolar core biopsy  . BREAST SURGERY  09/27/11   Right Breast; Excision of  Anterior Margin- Benign Breast Parenchyma, Negative for In situ and Invasive Cancer, Surgical Margins Negative, Benign skin, Negative for Malignancy  . CATARACT EXTRACTION, BILATERAL  2015, 2016  . CESAREAN SECTION    . eye lift    . laser surgery left eye  08/14/2020  . TONSILLECTOMY      Current Outpatient Medications  Medication Sig Dispense Refill  . albuterol (PROAIR HFA) 108 (90 Base) MCG/ACT inhaler Inhale 2 puffs into the lungs every 6 (six) hours as needed for wheezing or shortness of breath. 54 g 0  . APPLE CIDER VINEGAR PO Take by mouth. gummy    . atorvastatin (LIPITOR) 20 MG tablet Take 20 mg by mouth at bedtime.  0  . azelastine (ASTELIN) 0.1 % nasal spray 1 SPRAY IN EACH NOSTRIL TWICE DAILY AS DIRECTED 30 mL 5  . BLACK ELDERBERRY PO Take by mouth.    . budesonide-formoterol (SYMBICORT) 160-4.5 MCG/ACT inhaler INHALE 2 PUFFS INTO THE LUNGS DAILY WITH SPACER 10.2 g 5  . calcium carbonate (OSCAL) 1500 (600 Ca) MG TABS tablet Take by mouth.    . Carboxymethylcellulose Sodium (ARTIFICIAL TEARS OP) Place into both eyes 2 (two) times daily.    Nicole Holland CARTIA XT 240 MG 24 hr capsule Take 240 mg by mouth daily.    . carvedilol (COREG) 6.25 MG tablet Take 6.25 mg by mouth 2 (two) times daily.  0  . cetirizine  (ZYRTEC) 10 MG tablet TK 1 T PO QD    . Cholecalciferol 25 MCG (1000 UT) capsule Take by mouth.    . EPINEPHrine 0.3 mg/0.3 mL IJ SOAJ injection Inject 0.3 mg into the muscle as needed for anaphylaxis. 1 each 1  . FEROSUL 325 (65 Fe) MG tablet Take 325 mg by mouth daily.    . fluorometholone (FML) 0.1 % ophthalmic suspension 1 drop 4 (four) times daily.    . furosemide (LASIX) 40 MG tablet Take 20 mg by mouth 2 (two) times daily.  0  . glucose blood test strip TEST once daily    . guaiFENesin (MUCINEX) 600 MG 12 hr tablet Take 600 mg by mouth 2 (two) times daily.    . Lactobacillus (PROBIOTIC ACIDOPHILUS PO) Take by mouth.    . latanoprost (XALATAN) 0.005 % ophthalmic solution 1 drop at bedtime.    Nicole Holland LATANOPROST OP Apply to eye at bedtime. 0.1%    . losartan (COZAAR) 25 MG tablet Take 25 mg by mouth daily.  0  . mometasone (NASONEX) 50 MCG/ACT nasal spray Place 2 sprays into the nose daily. 17 g 5  . Multiple Vitamins-Minerals (CENTRUM SILVER PO) Take 1 tablet by mouth daily.    . Omega-3 Fatty Acids (FISH OIL) 1200 MG CAPS Take 1,200 mg by mouth 2 (two) times daily.    Glory Rosebush DELICA LANCETS 99B MISC TEST ONCE DAILY AS DIRECTED    . Polyethylene Glycol 400 0.25 % SOLN Apply 1 drop to eye as needed (dry eyes).    . sodium chloride (MURO 128) 5 % ophthalmic ointment Place 1 application into the left eye at bedtime.    . Turmeric 500 MG TABS Take 1 tablet by mouth daily with supper.    . vitamin C (ASCORBIC ACID) 500 MG tablet Take 500 mg by mouth daily.    . mycophenolate (CELLCEPT) 500 MG tablet Take 2 tablets (1,000 mg total) by mouth 2 (two) times daily. [place]  tablet 3   No current facility-administered medications for this visit.    Allergies as of 08/28/2020 - Review Complete 08/28/2020  Allergen Reaction Noted  . Iodides Itching 10/25/2010  . Peanut oil Other (See Comments) 01/04/2016  . Shellfish allergy Other (See Comments) 01/04/2016  . Soybean-containing drug products Other  (See Comments) 01/04/2016  . Vaccinium angustifolium Other (See Comments) 01/04/2016  . Mestinon [pyridostigmine bromide] Diarrhea 12/06/2016  . Penicillins Itching 10/25/2010    Vitals: BP 140/62 (BP Location: Left Arm, Patient Position: Sitting)   Pulse 71   Ht 4\' 11"  (1.499 m)   Wt 160 lb (72.6 kg)   SpO2 95%   BMI 32.32 kg/m  Last Weight:  Wt Readings from Last 1 Encounters:  08/28/20 160 lb (72.6 kg)   Last Height:   Ht Readings from Last 1 Encounters:  08/28/20 4\' 11"  (1.499 m)     Exam: Gen: NAD, conversant, well nourised,  well groomed , obese CV: RRR, +SEM. No Carotid Bruits. +peripheral edema, warm, nontender Eyes: Conjunctivae clear without exudates or hemorrhage  Neuro: Detailed Neurologic Exam  Speech: Speech is normal; fluent and spontaneous with normal comprehension.  Cognition: The patient is oriented to person, place, and time;   Cranial Nerves: The pupils are equal, round, and reactive to light. Attempted fundoscopic exam could not visualize. Visual fields are full to finger confrontation. EOMI, she has some fatiguable upgaze left eye. Trigeminal sensation is intact and the muscles of mastication are normal. Left> right ptosis(improved since starting cellcept). (Prior: Left to mid pupil right just above pupil).The palate elevates in the midline. Hearing intact. Voice is normal. Shoulder shrug is normal. The tongue has normal motion without fasciculations.   Coordination: No dysmetria  Gait: wide based  Motor Observation: can get out of chair without using hands. no involuntary movements noted. Tone: Normal muscle tone.   Posture: Posture is normal. normal erect  Strength: Still has mild (chronic) weakness proximal left arm otherwise appears intact and symmetric.    Sensation: intact to LT  Reflex Exam:   +ice pack test in the past      Assessment/Plan:  85 year old with  seropositive myasthenia gravis, acetylcholine receptor binding antibody positive, doing well on cell cept but she would like a trial off of the cellcept. I think that is reasonable, but if she develops ptosis or diplopia again may need to go back on it. Also discussed generalization is an emergency, for any weakness or especially swallowing or breathing difficulties call us as she may need to be admitted or call 911. Her symptoms were mostly ocular.   - will cc Monna Fam her ophthalmologist  PRIOR - Will hold off on emg/ncs, don't think we need an emg/ncs to diagnose myasthenia gravis in this case -  Did not tolerate Mestinon 30mg  3x a day, doing well on cellcept, increased strength, no symptoms of MG - CT of the chest to eval for thymoma: Negative - Continue CellCept 1000 mg twice daily.  Discussed side effects again, call for any side effects at all. We'll continue lab testing every 3-4 months until she sees me.   Continue Cellcept, discussed side effects of patient.. Likely takes at least 6 months and perhaps longer to produce significant benefit. There is evidence that mycophenolate has a more rapid onset of clinical effect.The medicine comes in 250 mg and 500 mg tablets. The standard dose is 1000 mg twice a day. Some people need a higher dose of 1500 mg twice a day.  The primary side effect that you might notice is gastrointestinal upset. To help overcome this, you should begin the medicine at one 250 mg tablet twice a day for the first five days and increase by adding one tablet morning and evening every five days until you get to the total dose of four tablets twice a day. Labs weekly for 4 weeks, then biweekly for 4 weeks and monthly afterwards. Discussed all side effects as per patient instructions and above including risk of rare brain disease calledPML.  Again, Discussed that shouldshe have acute worsening especially shortness of breath patient should call 911 and be transported  to the emergency room due to risks for myasthenia gravis exacerbation and respiratory failure.   Nicole Ill, MD  [facility] [address] [place], [place] [address]  Phone [phone] Fax [phone] I spent 20 minutes of face-to-face and non-face-to-face time with patient on the  1. Myasthenia gravis ([place])   2. Long-term use of high-risk medication    diagnosis.  This included previsit chart review, lab review, study review, order entry, electronic health record documentation, patient education on the different diagnostic and therapeutic options, counseling and coordination of care, risks and benefits of management, compliance, or risk factor reduction

## 2020-12-01 ENCOUNTER — Encounter: Payer: Self-pay | Admitting: Allergy

## 2020-12-01 ENCOUNTER — Other Ambulatory Visit: Payer: Self-pay

## 2020-12-01 ENCOUNTER — Ambulatory Visit (INDEPENDENT_AMBULATORY_CARE_PROVIDER_SITE_OTHER): Payer: Medicare Other | Admitting: Allergy

## 2020-12-01 VITALS — BP 130/74 | HR 57 | Temp 98.0°F | Resp 16 | Ht 59.0 in | Wt 154.2 lb

## 2020-12-01 DIAGNOSIS — J4540 Moderate persistent asthma, uncomplicated: Secondary | ICD-10-CM | POA: Diagnosis not present

## 2020-12-01 DIAGNOSIS — T7800XD Anaphylactic reaction due to unspecified food, subsequent encounter: Secondary | ICD-10-CM

## 2020-12-01 DIAGNOSIS — J3089 Other allergic rhinitis: Secondary | ICD-10-CM

## 2020-12-01 MED ORDER — BUDESONIDE-FORMOTEROL FUMARATE 160-4.5 MCG/ACT IN AERO: INHALATION_SPRAY | RESPIRATORY_TRACT | 5 refills | Status: AC

## 2020-12-01 MED ORDER — MONTELUKAST SODIUM 10 MG PO TABS: 10.0000 mg | ORAL_TABLET | Freq: Every day | ORAL | 5 refills | Status: DC

## 2020-12-01 MED ORDER — ALBUTEROL SULFATE HFA 108 (90 BASE) MCG/ACT IN AERS: 2.0000 | INHALATION_SPRAY | RESPIRATORY_TRACT | 1 refills | Status: DC | PRN

## 2020-12-01 NOTE — Patient Instructions (Addendum)
Moderate persistent asthma - Daily controller medication(s): Symbicort 160/4.5 2 puffs daily with spacer AND Singulair 10mg  daily at bedtime - Rescue medications: ProAir 4 puffs every 4-6 hours as needed.   - Asthma control goals:  * Full participation in all desired activities (may need albuterol before activity) * Albuterol use two time or less a week on average (not counting use with activity) * Cough interfering with sleep two time or less a month * Oral steroids no more than once a year * No hospitalizations   Allergic rhinoconjunctivitis - Continue Astelin 1 spray twice daily. - Continue cetirizine 10mg  daily.  Adverse food reactions (peanuts, soy, shellfish) - Continue to avoid these foods. - EpiPen is up to date.    Return in about 3-4 months or sooner if needed

## 2020-12-01 NOTE — Progress Notes (Signed)
Follow-up Note  RE: Nicole Holland MRN: [id number] DOB: 05-Sep-1935 Date of Office Visit: 12/01/2020   History of present illness: Nicole Holland is a 85 y.o. female presenting today for follow-up of asthma, allergic rhinitis and food allergy.  She was last seen in the office on 06/02/20 by myself.  She states she has been doing ok since last visit without any major health changes, surgeries or hospitalizations.   She does however states with the heat and allergies she is noticing more cough (dry and productive), congestion (chest) and wheezing.  She is using her albuterol more about 2-3 times a week but she states can have weeks where she doesn't need to use it.  She states her PCP did recommended she start singulair but she wanted to also ask me to see if I also thought it may be helpful for her.  She does continue on symbicort 179mcg 2 puffs twice a day.  She has not had an ED/UC visits or systemic steroid needs since last visit.  She does continue to use astelin nasal spray that she reports helps with nasal drainage control.  She also continues to take cetirizine daily.   She continues to avoid peanuts, soy and shellfish without any accidental ingestions or need to use her epinephrine device.   Review of systems: Review of Systems  Constitutional: Negative.   HENT: Negative.    Eyes: Negative.   Respiratory:  Positive for cough and wheezing.   Cardiovascular: Negative.   Gastrointestinal: Negative.   Musculoskeletal: Negative.   Skin: Negative.   Neurological: Negative.    All other systems negative unless noted above in HPI  Past medical/social/surgical/family history have been reviewed and are unchanged unless specifically indicated below.  No changes  Medication List: Current Outpatient Medications  Medication Sig Dispense Refill   APPLE CIDER VINEGAR PO Take by mouth. gummy     atorvastatin (LIPITOR) 20 MG tablet Take 20 mg by mouth at bedtime.  0   azelastine  (ASTELIN) 0.1 % nasal spray 1 SPRAY IN EACH NOSTRIL TWICE DAILY AS DIRECTED 30 mL 5   BLACK ELDERBERRY PO Take by mouth.     calcium carbonate (OSCAL) 1500 (600 Ca) MG TABS tablet Take by mouth.     Carboxymethylcellulose Sodium (ARTIFICIAL TEARS OP) Place into both eyes 2 (two) times daily.     CARTIA XT 240 MG 24 hr capsule Take 240 mg by mouth daily.     carvedilol (COREG) 6.25 MG tablet Take 6.25 mg by mouth 2 (two) times daily.  0   cetirizine (ZYRTEC) 10 MG tablet TK 1 T PO QD     Cholecalciferol 25 MCG (1000 UT) capsule Take by mouth.     EPINEPHrine 0.3 mg/0.3 mL IJ SOAJ injection Inject 0.3 mg into the muscle as needed for anaphylaxis. 1 each 1   FEROSUL 325 (65 Fe) MG tablet Take 325 mg by mouth daily.     fluorometholone (FML) 0.1 % ophthalmic suspension 1 drop 4 (four) times daily.     furosemide (LASIX) 40 MG tablet Take 20 mg by mouth 2 (two) times daily.  0   glucose blood test strip TEST once daily     guaiFENesin (MUCINEX) 600 MG 12 hr tablet Take 600 mg by mouth 2 (two) times daily.     Lactobacillus (PROBIOTIC ACIDOPHILUS PO) Take by mouth.     latanoprost (XALATAN) 0.005 % ophthalmic solution 1 drop at bedtime.     LATANOPROST OP Apply  to eye at bedtime. 0.1%     losartan (COZAAR) 25 MG tablet Take 25 mg by mouth daily.  0   mometasone (NASONEX) 50 MCG/ACT nasal spray Place 2 sprays into the nose daily. 17 g 5   montelukast (SINGULAIR) 10 MG tablet Take 1 tablet (10 mg total) by mouth at bedtime. 30 tablet 5   Multiple Vitamins-Minerals (CENTRUM SILVER PO) Take 1 tablet by mouth daily.     mycophenolate (CELLCEPT) 500 MG tablet Take 2 tablets (1,000 mg total) by mouth 2 (two) times daily. 360 tablet 3   Omega-3 Fatty Acids (FISH OIL) 1200 MG CAPS Take 1,200 mg by mouth 2 (two) times daily.     ONETOUCH DELICA LANCETS 93A MISC TEST ONCE DAILY AS DIRECTED     Polyethylene Glycol 400 0.25 % SOLN Apply 1 drop to eye as needed (dry eyes).     sodium chloride (MURO 128) 5 %  ophthalmic ointment Place 1 application into the left eye at bedtime.     Turmeric 500 MG TABS Take 1 tablet by mouth daily with supper.     vitamin C (ASCORBIC ACID) 500 MG tablet Take 500 mg by mouth daily.     albuterol (PROAIR HFA) 108 (90 Base) MCG/ACT inhaler Inhale 2 puffs into the lungs every 4 (four) hours as needed for wheezing or shortness of breath. 18 g 1   budesonide-formoterol (SYMBICORT) 160-4.5 MCG/ACT inhaler INHALE 2 PUFFS INTO THE LUNGS DAILY WITH SPACER 10.2 g 5   No current facility-administered medications for this visit.     Known medication allergies: Allergies  Allergen Reactions   Iodides Itching    TOTAL BODY   Peanut Oil Other (See Comments)    Recent allergy testing   Shellfish Allergy Other (See Comments)    Per allergy testing   Soybean-Containing Drug Products Other (See Comments)    Per allergy testing   Vaccinium Angustifolium Other (See Comments)    Per allergy testing   Mestinon [Pyridostigmine Bromide] Diarrhea    "Stomach cramps, blood in stool"   Penicillins Itching    Has patient had a PCN reaction causing immediate rash, facial/tongue/throat swelling, SOB or lightheadedness with hypotension: no-- pt did not have rash but was itchy all over body Has patient had a PCN reaction causing severe rash involving mucus membranes or skin necrosis: no Has patient had a PCN reaction that required hospitalization no Has patient had a PCN reaction occurring within the last 10 years: no If all of the above answers are "NO", then may proceed with Cephalosporin use.       Physical examination: Blood pressure 130/74, pulse (!) 57, temperature 98 F (36.7 C), resp. rate 16, height 4\' 11"  (1.499 m), weight 154 lb 3.2 oz (69.9 kg), SpO2 95 %.  General: Alert, interactive, in no acute distress. HEENT: PERRLA,TMs pearly gray, turbinates non-edematous without discharge, post-pharynx non erythematous. Neck: Supple without lymphadenopathy. Lungs: Mildly  decreased breath sounds with expiratory wheezing bilaterally. {no increased work of breathing. CV: Normal S1, S2 without murmurs. Abdomen: Nondistended, nontender. Skin: Warm and dry, without lesions or rashes. Extremities:  No clubbing, cyanosis or edema. Neuro:   Grossly intact.  Diagnositics/Labs:  Spirometry: FEV1: 0.72L 100%, FVC: 0.95L 111%, ratio consistent with nonobstructive pattern  Assessment and plan:   Moderate persistent asthma - Daily controller medication(s): Symbicort 160/4.5 2 puffs daily with spacer AND Singulair 10mg  daily at bedtime - Rescue medications: ProAir 4 puffs every 4-6 hours as needed.   - Asthma  control goals:  * Full participation in all desired activities (may need albuterol before activity) * Albuterol use two time or less a week on average (not counting use with activity) * Cough interfering with sleep two time or less a month * Oral steroids no more than once a year * No hospitalizations   Allergic rhinoconjunctivitis - Continue Astelin 1 spray twice daily. - Continue cetirizine 10mg  daily.  Adverse food reactions (peanuts, soy, shellfish) - Continue to avoid these foods. - EpiPen is up to date.    Return in about 3-4 months or sooner if needed  I appreciate the opportunity to take part in Nicole Holland's care. Please do not hesitate to contact me with questions.  Sincerely,   Prudy Feeler, MD Allergy/Immunology [facility]

## 2021-02-12 ENCOUNTER — Telehealth: Payer: Self-pay | Admitting: Allergy

## 2021-02-12 NOTE — Telephone Encounter (Signed)
Pt called and requested a nebulizer sent in for her breathing and she has some chest congestive.

## 2021-02-12 NOTE — Telephone Encounter (Signed)
I don't see a prescription for nebulizer medication. Is it ok for the patient to have a nebulizer and nebulizer medication?

## 2021-02-12 NOTE — Telephone Encounter (Signed)
Attempted to call patient but phone call went straight to voicemail. Asked for a return call to discuss if she would like to come pick up the nebulizer in person and to discuss sending in Albuterol nebulizer medication.

## 2021-02-13 MED ORDER — ALBUTEROL SULFATE (2.5 MG/3ML) 0.083% IN NEBU: 2.5000 mg | INHALATION_SOLUTION | RESPIRATORY_TRACT | 1 refills | Status: AC | PRN

## 2021-02-13 NOTE — Telephone Encounter (Signed)
Patient called back and agreed to coming in and picking up Nebulizer. Prescription for Albuterol has been sent in to requested pharmacy. Nebulizer has been placed up front in the [place] office for pickup. Patient verbalized understanding.

## 2021-02-13 NOTE — Telephone Encounter (Signed)
Patient came and picked up nebulizer. Patient signed forms and verbalized understanding to give number on form a call if anything were to break or go missing.

## 2021-02-15 ENCOUNTER — Telehealth: Payer: Self-pay

## 2021-02-15 NOTE — Telephone Encounter (Signed)
Nebulizer forms have been signed by Dr. Nelva Bush and faxed.

## 2021-03-05 ENCOUNTER — Ambulatory Visit (INDEPENDENT_AMBULATORY_CARE_PROVIDER_SITE_OTHER): Payer: Medicare Other | Admitting: Neurology

## 2021-03-05 ENCOUNTER — Encounter: Payer: Self-pay | Admitting: Neurology

## 2021-03-05 VITALS — BP 145/67 | HR 82 | Ht 59.0 in

## 2021-03-05 DIAGNOSIS — G7000 Myasthenia gravis without (acute) exacerbation: Secondary | ICD-10-CM | POA: Diagnosis not present

## 2021-03-05 DIAGNOSIS — Z79899 Other long term (current) drug therapy: Secondary | ICD-10-CM | POA: Diagnosis not present

## 2021-03-05 NOTE — Progress Notes (Signed)
[facility]    Provider:  Dr [name] Referring Provider: Elinor Parkinson Primary Care Physician:  Sue Lush, PA-C  CC:  Myasthenia Gravis, predominantly ocular  Interval history 03/05/2021: She is not having droopy eyes or double vision. Appears her myasthenia gravis never generalized and is doing well. She is not having any symptoms.  Today she still has some slight ptosis bilaterally, but extraocular movements intact without any fatigable upgaze.  Last time she was here she did not want to continue with the CellCept, and at that time I told her we could try being off the CellCept.  Today she does not remember that, we had the same discussion today, I am okay if she wants to come off of the CellCept.  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  At this time since she has been generalized its less likely she will, she will probably stay with ocular myasthenia.  Interval history 08/28/2020; She has been on Cellcept for ocular myasthenia gravis. Not generalized. She Doesn't want to take the cellcept anymore. We discussed if she has ptosis or weakness or difficulty breathing or any other issues need to see me right away or if having difficulty with walking or breathing she should go to the ED, risk of exacerbation  05/2020: CBC with stable anemia, CMP creatinine 1.73 otherwise   Interval history: here for follow up on myasthenia Gravis. Doing well on Cellcept. No symptoms even when she was sick. No diplopia, no difficulty swallowing, continue cellcept.  Interval history; Doing well, no double vision, feels improved. No side effects to the medication. Taking medication regularly. No SOB, no double vision, no weakness.   Interval 03/12/2017:  Nicole Holland is a 85 y.o. female here as a referral from Dr. Lavone Neri for Myasthenia Gravis.ACHR binding antibody serum was positive. No double vision. Feels improved. She has  finished her prednisone.  Will increase CellCept 1000 mg twice a day which is the preferred dose. Would prefer patient to come back in 3 months however she declines, so she does not want to come back for 6 months. We will do labs monthly until she sees me again. Discussed side effects again and asked her to please call our office if there is any side effects whatsoever.   Interval History 12/09/2016:  Nicole Holland is a 85 y.o. female here as a referral from Dr. Lavone Neri for Myasthenia Gravis.ACHR binding antibody serum was positive.  I had a long talk with her at last appointment however I don't the patient understands there may be memory problems. Asked her son to come along with her that she is here alone again today. I asked her to explain to me what is going on and she said "I really don't know what is going on". She had a long appointment with the ophthalmologist to discuss MG and also I spoke to her about MG but appears she has memory problems and a vague understanding.    [facility]   HPI:  Nicole Holland is a 85 y.o. 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 She noticed eyelid drooping for a year. The eye symptoms don;t change with time of day, not better in the morning or evening. Pollen makes it worse, night driving is hard. No significant improvement after a nap. The left eye is worse. It is stable. People think she is asleep  when she is not actually asleep. No double vision. No difficulty swallowing, breathing, no voice changing. Pollen season has been bad. No voice changes. Her legs try to give out on her but that's been "going on forever" no new muscle or new difficulties lifting arms overhead or climbing stairs. She can comb and style hair, brush teeth, can get out of seats. No neck weakness.    Reviewed notes, labs and imaging from outside physicians, which showed:    Drooping of the right eye and left eye. Patient's [facility]. Eyelids have been drooping for the past year and getting worse, causing I was to feel heavy and rubs slightly as the eyes. She is not able to see as clearly as she used to. She feels is progressively getting worse. Eyes have been tearing and burning which makes vision blurry sometimes. She can't see as clear as she would like to due to her eyelids drooping down in her eyes very bothersome. In driving at night has become a problem. Reviewed exam which showed a 20/60 in 2040 distance 20/50 in 20/30 near, patient was not dilated, intraocular pressure is normal, pupils equally round and reactive to light no APD, open angles, extraocular movements intact, myogenic ptosis, anterior chamber is normal, adnexa normal. They were referred here for EMG. Myasthenia panel was sent and a ACHR binding antibody serum was positive.    Reviewed labs: Receptor binding antibodies were elevated at 0.29 otherwise blocking antibodies and modulating antibodies were negative.   Review of Systems: Patient complains of symptoms per HPI as well as the following symptoms: no SOB, no CP. Pertinent negatives per HPI. All others negative.     Social History   Socioeconomic History   Marital status: Married    Spouse name: separated, still married   Number of children: 4   Years of education: 12   Highest education level: Not on file  Occupational History    Comment: NA  Tobacco Use   Smoking status: Never   Smokeless tobacco: Never  Vaping Use   Vaping Use: Never used  Substance and Sexual Activity   Alcohol use: No   Drug use: No   Sexual activity: Never    Birth control/protection: Post-menopausal    Comment: mensus age 18,G5P4, 70st preg age 61, b.c short time,couldn't tolerate  Other Topics Concern   Not on file  Social History Narrative   Lives alone (son and daughter-in-law are nearby)   Caffeine- none   Right handed   Social Determinants of Health   Financial Resource Strain: Not on file   Food Insecurity: Not on file  Transportation Needs: Not on file  Physical Activity: Not on file  Stress: Not on file  Social Connections: Not on file  Intimate Partner Violence: Not on file    Family History  Problem Relation Age of Onset   Stroke Mother    Diabetes Mother    Cancer Mother    Cancer Father        Lung cancer   Diabetes Sister    Cancer Sister        Unknown Cancer   Diabetes Brother    Cancer Brother        Unknown cancer   Diabetes Brother    Cancer Brother        Unknown cancer   Cancer Brother        Unknown Cancer   Diabetes Brother    Cancer Brother  Unknown Cancer   Autoimmune disease Neg Hx     Past Medical History:  Diagnosis Date   Arthritis    Asthma    Breast cancer ([place]) 08/02/11   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 difficile diarrhea 2019   Gout    Hyperlipidemia    Hypertension    S/P radiation therapy 10/28/11 - 12/02/11   Right Breast/ 5000cGy/ 25 Fractions   Sinus infection 2019   Use of tamoxifen (Nolvadex) 12/04/11   Dr. Truddie Coco   Vitamin D deficiency     Past Surgical History:  Procedure Laterality Date   ABDOMINAL HYSTERECTOMY  1994   partial ovaries intact   BREAST LUMPECTOMY  08/02/11   Lumpectomy right Breast; Low Grade Ductal Carcinoma In Situ with Associated Calcifications, ER 65%, PR 99%   BREAST SURGERY  02/01/2000   right breast subareolar core biopsy   BREAST SURGERY  09/27/11   Right Breast; Excision of  Anterior Margin- Benign Breast Parenchyma, Negative for In situ and Invasive Cancer, Surgical Margins Negative, Benign skin, Negative for Malignancy   CATARACT EXTRACTION, BILATERAL  2015, 2016   CESAREAN SECTION     eye lift     laser surgery left eye  08/14/2020   TONSILLECTOMY      Current Outpatient Medications  Medication Sig Dispense Refill   albuterol (PROAIR HFA) 108 (90 Base) MCG/ACT inhaler Inhale 2 puffs into the lungs every 4 (four) hours as needed for wheezing or  shortness of breath. 18 g 1   albuterol (PROVENTIL) (2.5 MG/3ML) 0.083% nebulizer solution Take 3 mLs (2.5 mg total) by nebulization every 4 (four) hours as needed for wheezing or shortness of breath. 75 mL 1   APPLE CIDER VINEGAR PO Take by mouth. gummy     atorvastatin (LIPITOR) 20 MG tablet Take 20 mg by mouth at bedtime.  0   azelastine (ASTELIN) 0.1 % nasal spray 1 SPRAY IN EACH NOSTRIL TWICE DAILY AS DIRECTED 30 mL 5   BLACK ELDERBERRY PO Take by mouth.     budesonide-formoterol (SYMBICORT) 160-4.5 MCG/ACT inhaler INHALE 2 PUFFS INTO THE LUNGS DAILY WITH SPACER 10.2 g 5   calcium carbonate (OSCAL) 1500 (600 Ca) MG TABS tablet Take by mouth.     Carboxymethylcellulose Sodium (ARTIFICIAL TEARS OP) Place into both eyes 2 (two) times daily.     CARTIA XT 240 MG 24 hr capsule Take 240 mg by mouth daily.     carvedilol (COREG) 6.25 MG tablet Take 6.25 mg by mouth 2 (two) times daily.  0   cetirizine (ZYRTEC) 10 MG tablet TK 1 T PO QD     Cholecalciferol 25 MCG (1000 UT) capsule Take by mouth.     EPINEPHrine 0.3 mg/0.3 mL IJ SOAJ injection Inject 0.3 mg into the muscle as needed for anaphylaxis. 1 each 1   FEROSUL 325 (65 Fe) MG tablet Take 325 mg by mouth daily.     fluorometholone (FML) 0.1 % ophthalmic suspension 1 drop 4 (four) times daily.     furosemide (LASIX) 40 MG tablet Take 20 mg by mouth 2 (two) times daily.  0   glucose blood test strip TEST once daily     guaiFENesin (MUCINEX) 600 MG 12 hr tablet Take 600 mg by mouth 2 (two) times daily.     Lactobacillus (PROBIOTIC ACIDOPHILUS PO) Take by mouth.     latanoprost (XALATAN) 0.005 % ophthalmic solution 1 drop at bedtime.  LATANOPROST OP Apply to eye at bedtime. 0.1%     losartan (COZAAR) 25 MG tablet Take 25 mg by mouth daily.  0   mometasone (NASONEX) 50 MCG/ACT nasal spray Place 2 sprays into the nose daily. 17 g 5   montelukast (SINGULAIR) 10 MG tablet Take 1 tablet (10 mg total) by mouth at bedtime. 30 tablet 5   Multiple  Vitamins-Minerals (CENTRUM SILVER PO) Take 1 tablet by mouth daily.     Omega-3 Fatty Acids (FISH OIL) 1200 MG CAPS Take 1,200 mg by mouth 2 (two) times daily.     ONETOUCH DELICA LANCETS 99991111 MISC TEST ONCE DAILY AS DIRECTED     Polyethyl Glycol-Propyl Glycol (SYSTANE PRESERVATIVE FREE OP) Apply to eye.     Polyethylene Glycol 400 0.25 % SOLN Apply 1 drop to eye as needed (dry eyes).     sodium chloride (MURO 128) 5 % ophthalmic ointment Place 1 application into the left eye at bedtime.     Turmeric 500 MG TABS Take 1 tablet by mouth daily with supper.     vitamin C (ASCORBIC ACID) 500 MG tablet Take 500 mg by mouth daily.     No current facility-administered medications for this visit.    Allergies as of 03/05/2021 - Review Complete 03/05/2021  Allergen Reaction Noted   Iodides Itching 10/25/2010   Peanut oil Other (See Comments) 01/04/2016   Shellfish allergy Other (See Comments) 01/04/2016   Soybean-containing drug products Other (See Comments) 01/04/2016   Vaccinium angustifolium Other (See Comments) 01/04/2016   Mestinon [pyridostigmine bromide] Diarrhea 12/06/2016   Penicillins Itching 10/25/2010    Vitals: BP (!) 145/67 (BP Location: Right Arm, Patient Position: Sitting)   Pulse 82   Ht '4\' 11"'$  (1.499 m)   BMI 31.14 kg/m  Last Weight:  Wt Readings from Last 1 Encounters:  12/01/20 154 lb 3.2 oz (69.9 kg)   Last Height:   Ht Readings from Last 1 Encounters:  03/05/21 '4\' 11"'$  (1.499 m)     Exam: Gen: NAD, conversant, well nourised,  well groomed , obese                    CV: RRR, +SEM. No Carotid Bruits. +peripheral edema, warm, nontender Eyes: Conjunctivae clear without exudates or hemorrhage   Neuro: Detailed Neurologic Exam   Speech:    Speech is normal; fluent and spontaneous with normal comprehension.  Cognition:    The patient is oriented to person, place, and time;   Cranial Nerves:    The pupils are equal, round, and reactive to light. Attempted  fundoscopic exam could not visualize. Visual fields are full to finger confrontation. EOMI, she had some fatiguable upgaze left eye in the past but appears normal now. Trigeminal sensation is intact and the muscles of mastication are normal. Left> right ptosis(improved since starting cellcept). (Prior: Left to mid pupil right just above pupil). The palate elevates in the midline. Hearing intact. Voice is normal. Shoulder shrug is normal. The tongue has normal motion without fasciculations.    Coordination:    No dysmetria   Gait:  wide based   Motor Observation:    can get out of chair without using hands. no involuntary movements noted. Tone:    Normal muscle tone.     Posture:    Posture is normal. normal erect   Strength: Her strength is intact, I don;t notice any focal weakness, she has some pain but overall strength is god inparticular proximal strength.  Sensation: intact to LT   Reflex Exam:    +ice pack test in the past         Assessment/Plan:  85 year old with seropositive myasthenia gravis, acetylcholine receptor binding antibody positive, doing well on cell cept but she would like a trial off of the cellcept. I think that is reasonable, but if she develops ptosis or diplopia again may need to go back on it. Also discussed again generalization is an emergency, for any weakness or especially swallowing or breathing difficulties call us as she may need to be admitted or call 911. 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   - will cc Monna Fam her ophthalmologist - will check labs, again if she wishes a trial off of it that is ok but we would restart if her symptoms recurred.  - She can stop the mycophenolate and see if she is symptomatic.   PRIOR Assessment and plan: - Will hold off on emg/ncs, don't think we need an emg/ncs to diagnose myasthenia gravis in this case -  Did not tolerate Mestinon '30mg'$  3x a day, doing well on cellcept, increased strength, no symptoms of MG - CT of the chest to eval for thymoma: Negative - Continue CellCept 1000 mg twice daily.  Discussed side effects again, call for any side effects at all. We'll continue lab testing every 3-4 months until she sees me.     Continue Cellcept, discussed side effects of patient.. Likely takes at least 6 months and perhaps longer to produce significant benefit. There is evidence that mycophenolate has a more rapid onset of clinical effect. The medicine comes in 250 mg and 500 mg tablets. The standard dose is 1000 mg twice a day. Some people need a higher dose of 1500 mg twice a day. The primary side effect that you might notice is gastrointestinal upset. To help overcome this, you should begin the medicine at one 250 mg tablet twice a day for the first five days and increase by adding one tablet morning and evening every five days until you get to the total dose of four tablets twice a day.  Labs weekly for 4 weeks, then biweekly for 4 weeks and monthly afterwards. Discussed all side effects as per patient instructions and above including risk of rare brain disease called PML.    Again, Discussed that should she have acute worsening especially shortness of breath patient should call 911 and be transported to the emergency room due to risks for myasthenia gravis exacerbation and respiratory failure.   Sarina Ill, MD  [facility] [address] [place], [place] [address]  Phone  [phone] Fax [phone] I spent 30 minutes of face-to-face and non-face-to-face time with patient on the  1. Ocular myasthenia gravis ([place])   2. Long-term use of high-risk medication     diagnosis.  This included previsit chart review, lab review, study review, order entry, electronic health record documentation, patient education on the different  diagnostic and therapeutic options, counseling and coordination of care, risks and benefits of management, compliance, or risk factor reduction

## 2021-03-06 DIAGNOSIS — Z79899 Other long term (current) drug therapy: Secondary | ICD-10-CM | POA: Insufficient documentation

## 2021-04-04 ENCOUNTER — Other Ambulatory Visit: Payer: Self-pay

## 2021-04-04 ENCOUNTER — Encounter: Payer: Self-pay | Admitting: Allergy

## 2021-04-04 ENCOUNTER — Ambulatory Visit (INDEPENDENT_AMBULATORY_CARE_PROVIDER_SITE_OTHER): Payer: Medicare Other | Admitting: Allergy

## 2021-04-04 VITALS — BP 130/80 | HR 72 | Temp 98.0°F | Resp 16 | Ht 59.0 in | Wt 155.0 lb

## 2021-04-04 DIAGNOSIS — J3089 Other allergic rhinitis: Secondary | ICD-10-CM | POA: Diagnosis not present

## 2021-04-04 DIAGNOSIS — T7800XD Anaphylactic reaction due to unspecified food, subsequent encounter: Secondary | ICD-10-CM

## 2021-04-04 DIAGNOSIS — J4540 Moderate persistent asthma, uncomplicated: Secondary | ICD-10-CM

## 2021-04-04 MED ORDER — MONTELUKAST SODIUM 10 MG PO TABS: 10.0000 mg | ORAL_TABLET | Freq: Every day | ORAL | 5 refills | Status: DC

## 2021-04-04 NOTE — Patient Instructions (Addendum)
Moderate persistent asthma - Daily controller medication(s): Symbicort 160/4.5 2 puffs daily with spacer AND Singulair 10mg  daily at bedtime - Rescue medications: ProAir 4 puffs every 4-6 hours as needed.   - Asthma control goals:  * Full participation in all desired activities (may need albuterol before activity) * Albuterol use two time or less a week on average (not counting use with activity) * Cough interfering with sleep two time or less a month * Oral steroids no more than once a year * No hospitalizations   Allergic rhinoconjunctivitis - Continue Astelin 1 spray twice daily. - Continue cetirizine 10mg  daily.  Adverse food reactions (peanuts, soy, shellfish) - Continue to avoid these foods. - EpiPen is up to date.    Return in about 6 months or sooner if needed

## 2021-04-04 NOTE — Progress Notes (Signed)
Follow-up Note  RE: Nicole Holland MRN: [id number] DOB: 08/23/1935 Date of Office Visit: 04/04/2021   History of present illness: Nicole Holland is a 85 y.o. female presenting today for follow-up of asthma, allergic rhinoconjunctivitis and adverse food reaction.  She was last seen in the office on 12/01/20 by myself.  She has not had any major health changes, surgeries or hospitalizations since her last visit.  She states she is doing great today. She states her asthma has been doing well.  She reports has only needed to use her albuterol 3-4 times for shortness of breath since last visit.  She has not had any nighttime awakenings.  No ED/UC visits or systemic steroid needs.  She continues to use her Symbicort 2 puffs twice a day and montelukast daily.  She states she was told she did not have any more refills on her montelukast however her montelukast prescription bottle says she has 3 refills left before June 2023. She states her allergy symptoms are controlled with use of cetirizine daily and astelin.   She continues to avoid peanuts, soy and shellfish in diet.  She has not had to use her epinephrine device. She does state the cold weather makes her joints ache.     Review of systems: Review of Systems  Constitutional: Negative.   HENT: Negative.    Eyes: Negative.   Respiratory:  Positive for shortness of breath.   Cardiovascular: Negative.   Gastrointestinal: Negative.   Musculoskeletal:        See HPI  Skin: Negative.   Neurological: Negative.    All other systems negative unless noted above in HPI  Past medical/social/surgical/family history have been reviewed and are unchanged unless specifically indicated below.  No changes  Medication List: Current Outpatient Medications  Medication Sig Dispense Refill   albuterol (PROAIR HFA) 108 (90 Base) MCG/ACT inhaler Inhale 2 puffs into the lungs every 4 (four) hours as needed for wheezing or shortness of breath. 18 g 1    albuterol (PROVENTIL) (2.5 MG/3ML) 0.083% nebulizer solution Take 3 mLs (2.5 mg total) by nebulization every 4 (four) hours as needed for wheezing or shortness of breath. 75 mL 1   APPLE CIDER VINEGAR PO Take by mouth. gummy     atorvastatin (LIPITOR) 20 MG tablet Take 20 mg by mouth at bedtime.  0   azelastine (ASTELIN) 0.1 % nasal spray 1 SPRAY IN EACH NOSTRIL TWICE DAILY AS DIRECTED 30 mL 5   BLACK ELDERBERRY PO Take by mouth.     budesonide-formoterol (SYMBICORT) 160-4.5 MCG/ACT inhaler INHALE 2 PUFFS INTO THE LUNGS DAILY WITH SPACER 10.2 g 5   calcium carbonate (OSCAL) 1500 (600 Ca) MG TABS tablet Take by mouth.     Carboxymethylcellulose Sodium (ARTIFICIAL TEARS OP) Place into both eyes 2 (two) times daily.     CARTIA XT 240 MG 24 hr capsule Take 240 mg by mouth daily.     carvedilol (COREG) 6.25 MG tablet Take 6.25 mg by mouth 2 (two) times daily.  0   cetirizine (ZYRTEC) 10 MG tablet TK 1 T PO QD     Cholecalciferol 25 MCG (1000 UT) capsule Take by mouth.     EPINEPHrine 0.3 mg/0.3 mL IJ SOAJ injection Inject 0.3 mg into the muscle as needed for anaphylaxis. 1 each 1   FEROSUL 325 (65 Fe) MG tablet Take 325 mg by mouth daily.     fluorometholone (FML) 0.1 % ophthalmic suspension 1 drop 4 (four) times daily.  furosemide (LASIX) 40 MG tablet Take 20 mg by mouth 2 (two) times daily.  0   glucose blood test strip TEST once daily     guaiFENesin (MUCINEX) 600 MG 12 hr tablet Take 600 mg by mouth 2 (two) times daily.     Lactobacillus (PROBIOTIC ACIDOPHILUS PO) Take by mouth.     latanoprost (XALATAN) 0.005 % ophthalmic solution 1 drop at bedtime.     LATANOPROST OP Apply to eye at bedtime. 0.1%     losartan (COZAAR) 25 MG tablet Take 25 mg by mouth daily.  0   mometasone (NASONEX) 50 MCG/ACT nasal spray Place 2 sprays into the nose daily. 17 g 5   montelukast (SINGULAIR) 10 MG tablet Take 1 tablet (10 mg total) by mouth at bedtime. 30 tablet 5   Multiple Vitamins-Minerals (CENTRUM  SILVER PO) Take 1 tablet by mouth daily.     Omega-3 Fatty Acids (FISH OIL) 1200 MG CAPS Take 1,200 mg by mouth 2 (two) times daily.     ONETOUCH DELICA LANCETS 27P MISC TEST ONCE DAILY AS DIRECTED     Polyethyl Glycol-Propyl Glycol (SYSTANE PRESERVATIVE FREE OP) Apply to eye.     Polyethylene Glycol 400 0.25 % SOLN Apply 1 drop to eye as needed (dry eyes).     sodium chloride (MURO 128) 5 % ophthalmic ointment Place 1 application into the left eye at bedtime.     Turmeric 500 MG TABS Take 1 tablet by mouth daily with supper.     vitamin C (ASCORBIC ACID) 500 MG tablet Take 500 mg by mouth daily.     No current facility-administered medications for this visit.     Known medication allergies: Allergies  Allergen Reactions   Iodides Itching    TOTAL BODY   Peanut Oil Other (See Comments)    Recent allergy testing   Shellfish Allergy Other (See Comments)    Per allergy testing   Soybean-Containing Drug Products Other (See Comments)    Per allergy testing   Vaccinium Angustifolium Other (See Comments)    Per allergy testing   Mestinon [Pyridostigmine Bromide] Diarrhea    "Stomach cramps, blood in stool"   Penicillins Itching    Has patient had a PCN reaction causing immediate rash, facial/tongue/throat swelling, SOB or lightheadedness with hypotension: no-- pt did not have rash but was itchy all over body Has patient had a PCN reaction causing severe rash involving mucus membranes or skin necrosis: no Has patient had a PCN reaction that required hospitalization no Has patient had a PCN reaction occurring within the last 10 years: no If all of the above answers are "NO", then may proceed with Cephalosporin use.       Physical examination: Blood pressure 130/80, pulse 72, temperature 98 F (36.7 C), resp. rate 16, height 4\' 11"  (1.499 m), weight 155 lb (70.3 kg), SpO2 98 %.  General: Alert, interactive, in no acute distress. HEENT: PERRLA, TMs pearly gray, turbinates minimally  edematous without discharge, post-pharynx non erythematous. Neck: Supple without lymphadenopathy. Lungs: Clear to auscultation without wheezing, rhonchi or rales. {no increased work of breathing. CV: Normal S1, S2 without murmurs. Abdomen: Nondistended, nontender. Skin: Warm and dry, without lesions or rashes. Extremities:  No clubbing, cyanosis or edema. Neuro:   Grossly intact.  Diagnositics/Labs:  Spirometry: FEV1: 0.96L 70%, FVC: 1.3L 68%, ratio consistent with mild restrctive pattern.  Upon review of spirometries she does tend to have lower lung function during colder months than warmer months   Assessment  and plan:   Moderate persistent asthma - Daily controller medication(s): Symbicort 160/4.5 2 puffs daily with spacer AND Singulair 10mg  daily at bedtime - Rescue medications: ProAir 4 puffs every 4-6 hours as needed.   - Asthma control goals:  * Full participation in all desired activities (may need albuterol before activity) * Albuterol use two time or less a week on average (not counting use with activity) * Cough interfering with sleep two time or less a month * Oral steroids no more than once a year * No hospitalizations   Allergic rhinoconjunctivitis - Continue Astelin 1 spray twice daily for nasal drainage. - Continue cetirizine 10mg  daily.  Adverse food reactions (peanuts, soy, shellfish) - Continue to avoid these foods. - EpiPen is up to date.    Return in about 6 months or sooner if needed  I appreciate the opportunity to take part in Nezzie's care. Please do not hesitate to contact me with questions.  Sincerely,   Prudy Feeler, MD Allergy/Immunology [facility]

## 2021-04-20 ENCOUNTER — Other Ambulatory Visit: Payer: Self-pay | Admitting: Allergy

## 2021-05-22 ENCOUNTER — Other Ambulatory Visit (HOSPITAL_COMMUNITY): Payer: Self-pay | Admitting: Physician Assistant

## 2021-05-22 ENCOUNTER — Other Ambulatory Visit: Payer: Self-pay

## 2021-05-22 ENCOUNTER — Other Ambulatory Visit: Payer: Self-pay | Admitting: Physician Assistant

## 2021-05-22 ENCOUNTER — Ambulatory Visit (HOSPITAL_COMMUNITY)
Admission: RE | Admit: 2021-05-22 | Discharge: 2021-05-22 | Disposition: A | Discharge status: Home / Self Care | Payer: Medicare Other | Source: Ambulatory Visit | Attending: Physician Assistant | Admitting: Physician Assistant

## 2021-05-22 DIAGNOSIS — M79605 Pain in left leg: Secondary | ICD-10-CM

## 2021-05-22 NOTE — Progress Notes (Signed)
Lower extremity venous has been completed.   Preliminary results in CV Proc.   Jinny Blossom Paolina Karwowski 05/22/2021 4:49 PM

## 2021-08-01 ENCOUNTER — Other Ambulatory Visit: Payer: Self-pay | Admitting: Allergy

## 2021-09-04 ENCOUNTER — Encounter: Payer: Self-pay | Admitting: Family Medicine

## 2021-09-04 ENCOUNTER — Other Ambulatory Visit: Payer: Self-pay

## 2021-09-04 ENCOUNTER — Ambulatory Visit (INDEPENDENT_AMBULATORY_CARE_PROVIDER_SITE_OTHER): Payer: Medicare Other | Admitting: Family Medicine

## 2021-09-04 VITALS — BP 131/72 | HR 61 | Ht 59.0 in | Wt 162.5 lb

## 2021-09-04 DIAGNOSIS — G7000 Myasthenia gravis without (acute) exacerbation: Secondary | ICD-10-CM | POA: Diagnosis not present

## 2021-09-04 NOTE — Patient Instructions (Signed)
Below is our plan: ? ?We will continue to monitor symptoms. We will remain off Cellcept for now. Follow up with your eye doctor as scheduled.  ? ?Please make sure you are staying well hydrated. I recommend 50-60 ounces daily. Well balanced diet and regular exercise encouraged. Consistent sleep schedule with 6-8 hours recommended.  ? ?Please continue follow up with care team as directed.  ? ?Follow up with me in 1 year  ? ?You may receive a survey regarding today's visit. I encourage you to leave honest feed back as I do use this information to improve patient care. Thank you for seeing me today!  ? ? ?

## 2021-09-04 NOTE — Progress Notes (Signed)
? ? ?PATIENT: Nicole Holland ?DOB: 01/16/1936 ? ?REASON FOR VISIT: follow up ?HISTORY FROM: patient ? ?Chief Complaint  ?Patient presents with  ? Follow-up  ?  Rm 2, alone. Here for 6 month MG f/u. Seeing her Eye doc in May. Pt overall doing well today.   ?  ?HISTORY OF PRESENT ILLNESS: ? 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 She wished to discontinue Cellcept and was advised to do so at last visit with Dr [name] 02/2021. She reports doing very well. No weakness or vision changes noted. She denies generalized weakness. No trouble swallowing. She is followed by PCP regularly. She had a normal eye exam in 10/2020 and has scheduled follow up with ophthalmology 10/2021. She does endorse more allergy symptoms of watery eyes during season changes. She is able to drive without difficulty. No falls.  ? ?03/05/2021 AA:  ?She is not having droopy eyes or double vision. Appears her myasthenia gravis never generalized and is doing well. She is not having any symptoms.  Today she still has some slight ptosis bilaterally, but extraocular movements intact without any fatigable upgaze.  Last time she was here she did not want to continue with the CellCept, and at that time I told her we could try being off the CellCept.  Today she does not remember that, we had the same discussion today, I am okay if she wants to come off of the CellCept.  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  At this time since she has been generalized its less likely she will, she will probably stay with ocular myasthenia. ?  ?08/28/2020 AA:  ?She has been on Cellcept for ocular myasthenia gravis. Not generalized. She Doesn't want to take the cellcept anymore. We discussed if she has ptosis or weakness or difficulty breathing or any other issues need to see me right away or if having difficulty with walking or breathing she should go to the ED, risk of exacerbation ? ?08/26/2019 ALL:   ?Nicole Holland is a 86 y.o. female here today for follow up for occular MG.  She continues to do well with CellCept 1000 mg twice daily.  She denies any adverse effects with medications.  She denies any vision changes.  No double vision.  She denies eye drooping.  No changes in appetite or difficulty swallowing.  No weakness noted.  No changes in her.  She denies falls.  She is followed closely by primary care. ? ?08/25/2018 ALL: She is doing great. No symptoms. She is tolerating cell cept with no obvious adverse effects. She is trying to stay active. She is following up regularly with PCP, allergist, nephrologist and ophthalmologist for chronic disease management. CT chest negative for thymoma. She could not tolerate Mestinon.  ?  ?02/24/2018 AA: ?here for follow up on myasthenia Gravis. Doing well on Cellcept. No symptoms even when she was sick. No diplopia, no difficulty swallowing, continue cellcept. ?  ?09/10/2017 AA: Doing well, no double vision, feels improved. No side effects to the medication. Taking medication regularly. No SOB, no double vision, no weakness.  ?  ?03/12/2017 AA:  Nicole Holland is a 86 y.o. female here as a referral from Dr. Lavone Neri for Myasthenia Gravis.ACHR binding antibody serum was positive. No double vision. Feels improved. She has finished her prednisone.  Will increase CellCept 1000 mg twice a day which is the preferred dose. Would prefer patient to  come back in 3 months however she declines, so she does not want to come back for 6 months. We will do labs monthly until she sees me again. Discussed side effects again and asked her to please call our office if there is any side effects whatsoever. ?  ?12/09/2016 AA:  Nicole Holland is a 86 y.o. female here as a referral from Dr. Lavone Neri for Myasthenia Gravis.ACHR binding antibody serum was positive.  I had a long talk with her at last appointment however I don't the patient understands there may be memory problems. Asked her son to  come along with her that she is here alone again today. I asked her to explain to me what is going on and she said "I really don't know what is going on". She had a long appointment with the ophthalmologist to discuss MG and also I spoke to her about MG but appears she has memory problems and a vague understanding.  ?  ?[facility] ?  ?HPI:  Nicole Holland is a 86 y.o. 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 She noticed eyelid drooping for a year. The eye symptoms don;t change with time of day, not better in the morning or evening. Pollen makes it worse, night driving is hard. No significant improvement after a nap. The left eye is worse. It is stable. People think she is asleep when she is not actually asleep. No double vision. No difficulty swallowing, breathing, no voice changing. Pollen season has been bad. No voice changes. Her legs try to give out on her but that's been "going on forever" no new muscle or new difficulties lifting arms overhead or climbing stairs. She can comb and style hair, brush teeth, can get out of seats. No neck weakness.  ?  ?Reviewed notes, labs and imaging from outside physicians, which showed:  ?  ?Drooping of the right eye and left eye. Patient's [facility]. Eyelids have been drooping for the past year and getting worse, causing I was to feel heavy and rubs slightly as the eyes. She is not able to see as clearly as she used to. She feels is progressively getting worse. Eyes have been tearing and burning which makes vision blurry sometimes. She can't see as clear as she would like to due to her eyelids drooping down in her eyes very bothersome. In driving at night has become a problem. Reviewed exam which showed a 20/60 in 2040 distance 20/50 in 20/30 near, patient was not dilated, intraocular pressure is normal, pupils equally round and reactive to light no APD, open angles, extraocular movements intact, myogenic  ptosis, anterior chamber is normal, adnexa normal. They were referred here for EMG. Myasthenia panel was sent and a ACHR binding antibody serum was positive.  ?  ?Reviewed labs: Receptor binding antibodies were elevated at 0.29 otherwise blocking antibodies and modulating antibodies were negative. ?  ?REVIEW OF SYSTEMS: Out of a complete 14 system review of symptoms, the patient complains only of the following symptoms, arthritis, seasonal allergies and all other reviewed systems are negative. ? ?ALLERGIES: ?Allergies  ?Allergen Reactions  ? Iodides Itching  ?  TOTAL BODY  ? Peanut Oil Other (See Comments)  ?  Recent allergy testing  ? Shellfish Allergy Other (See Comments)  ?  Per allergy testing  ? Soybean-Containing Drug Products Other (See Comments)  ?  Per allergy testing  ? Vaccinium Angustifolium Other (See Comments)  ?  Per allergy testing  ? Mestinon [Pyridostigmine Bromide] Diarrhea  ?  "Stomach cramps, blood in stool"  ? Penicillins Itching  ?  Has patient had a PCN reaction causing immediate rash, facial/tongue/throat swelling, SOB or lightheadedness with hypotension: no-- pt did not have rash but was itchy all over body ?Has patient had a PCN reaction causing severe rash involving mucus membranes or skin necrosis: no ?Has patient had a PCN reaction that required hospitalization no ?Has patient had a PCN reaction occurring within the last 10 years: no ?If all of the above answers are "NO", then may proceed with Cephalosporin use. ? ?  ? ? ?HOME MEDICATIONS: ?Outpatient Medications Prior to Visit  ?Medication Sig Dispense Refill  ? albuterol (PROAIR HFA) 108 (90 Base) MCG/ACT inhaler Inhale 2 puffs into the lungs every 4 (four) hours as needed for wheezing or shortness of breath. 18 g 1  ? albuterol (PROVENTIL) (2.5 MG/3ML) 0.083% nebulizer solution Take 3 mLs (2.5 mg total) by nebulization every 4 (four) hours as needed for wheezing or shortness of breath. 75 mL 1  ? APPLE CIDER VINEGAR PO Take by mouth.  gummy    ? atorvastatin (LIPITOR) 20 MG tablet Take 20 mg by mouth at bedtime.  0  ? azelastine (ASTELIN) 0.1 % nasal spray USE 1 SPRAY IN EACH NOSTRIL TWICE DAILY AS DIRECTED 30 mL 2  ? BLACK ELDERBERRY

## 2021-10-03 ENCOUNTER — Encounter: Payer: Self-pay | Admitting: Allergy

## 2021-10-03 ENCOUNTER — Ambulatory Visit (INDEPENDENT_AMBULATORY_CARE_PROVIDER_SITE_OTHER): Payer: Medicare Other | Admitting: Allergy

## 2021-10-03 VITALS — BP 126/74 | HR 64 | Temp 98.0°F | Resp 18 | Ht 59.0 in | Wt 165.2 lb

## 2021-10-03 DIAGNOSIS — J3089 Other allergic rhinitis: Secondary | ICD-10-CM | POA: Diagnosis not present

## 2021-10-03 DIAGNOSIS — J4540 Moderate persistent asthma, uncomplicated: Secondary | ICD-10-CM | POA: Diagnosis not present

## 2021-10-03 DIAGNOSIS — T7800XD Anaphylactic reaction due to unspecified food, subsequent encounter: Secondary | ICD-10-CM

## 2021-10-03 NOTE — Patient Instructions (Addendum)
Moderate persistent asthma ?- Daily asthma medication: Singulair '10mg'$  daily at bedtime ?- Asthma Action plan (if you have a respiratory illness or asthma flare: take Symbicort 160/4.5 2 puffs daily with spacer for 1-2 weeks  ?- Rescue medications: ProAir 4 puffs every 4-6 hours as needed.   ?- Asthma control goals:  ?* Full participation in all desired activities (may need albuterol before activity) ?* Albuterol use two time or less a week on average (not counting use with activity) ?* Cough interfering with sleep two time or less a month ?* Oral steroids no more than once a year ?* No hospitalizations ? ? Allergic rhinoconjunctivitis ?- Continue Astelin 1- spray twice daily as needed for nasal drainage ?- Continue Nasonex 1-2 sprays each nostril as needed for nasal congestion ?- Continue cetirizine '10mg'$  daily. ? ?Adverse food reactions (peanuts, soy, shellfish) ?- Continue to avoid these foods. ?- EpiPen is up to date.  ? ? ?Return in about 6 months or sooner if needed ? ? ?

## 2021-10-03 NOTE — Progress Notes (Signed)
? ? ?Follow-up Note ? ?RE: Nicole Holland MRN: [id number] DOB: 02-25-1936 ?Date of Office Visit: 10/03/2021 ? ? ?History of present illness: ?Nicole Holland is a 86 y.o. female presenting today for follow-up of asthma, allergic rhinitis with conjunctivitis and food allergy.  She was last seen in the office on 04/04/2021 by myself.  She states she has been blessed.  At this time not reporting any significant allergy issues.  She states she can tell the pollen is out there but her nasal sprays are helping and she is using Nasonex and Astelin.  She also takes Zyrtec and Singulair but also continues to help with her allergy symptom control. ?Her asthma is doing quite well 2.  She states she has not needed the Symbicort in about 2 months.  She states she will take it if her asthma is acting up.  She also denies needing her albuterol inhaler.  She has not had any ED or urgent care visits or any systemic steroid needs. ?She does continue to avoid peanuts, soy products and shellfish.  She has not had any accidental ingestions or need to use her epinephrine device. ?She has not had symbicort in the past 2 months.  ? ?Review of systems: ?Review of Systems  ?Constitutional: Negative.   ?HENT: Negative.    ?Eyes: Negative.   ?Respiratory: Negative.    ?Cardiovascular: Negative.   ?Gastrointestinal: Negative.   ?Musculoskeletal: Negative.   ?Skin: Negative.   ?Allergic/Immunologic: Negative.   ?Neurological: Negative.    ? ?All other systems negative unless noted above in HPI ? ?Past medical/social/surgical/family history have been reviewed and are unchanged unless specifically indicated below. ? ?No changes ? ?Medication List: ?Current Outpatient Medications  ?Medication Sig Dispense Refill  ? APPLE CIDER VINEGAR PO Take by mouth. gummy    ? atorvastatin (LIPITOR) 20 MG tablet Take 20 mg by mouth at bedtime.  0  ? azelastine (ASTELIN) 0.1 % nasal spray USE 1 SPRAY IN EACH NOSTRIL TWICE DAILY AS DIRECTED 30 mL 2  ? BLACK  ELDERBERRY PO Take by mouth.    ? calcium carbonate (OSCAL) 1500 (600 Ca) MG TABS tablet Take by mouth.    ? CARTIA XT 240 MG 24 hr capsule Take 240 mg by mouth daily.    ? carvedilol (COREG) 6.25 MG tablet Take 6.25 mg by mouth 2 (two) times daily.  0  ? cetirizine (ZYRTEC) 10 MG tablet TK 1 T PO QD    ? Cholecalciferol 25 MCG (1000 UT) capsule Take by mouth.    ? EPINEPHrine 0.3 mg/0.3 mL IJ SOAJ injection Inject 0.3 mg into the muscle as needed for anaphylaxis. 1 each 1  ? FEROSUL 325 (65 Fe) MG tablet Take 325 mg by mouth daily.    ? furosemide (LASIX) 40 MG tablet Take 20 mg by mouth 2 (two) times daily.  0  ? glucose blood test strip TEST once daily    ? guaiFENesin (MUCINEX) 600 MG 12 hr tablet Take 600 mg by mouth 2 (two) times daily.    ? Lactobacillus (PROBIOTIC ACIDOPHILUS PO) Take by mouth.    ? latanoprost (XALATAN) 0.005 % ophthalmic solution Place 1 drop into both eyes at bedtime.    ? LATANOPROST OP Apply to eye at bedtime. 0.1%    ? losartan (COZAAR) 25 MG tablet Take 25 mg by mouth daily.  0  ? mometasone (NASONEX) 50 MCG/ACT nasal spray Place 2 sprays into the nose daily. 17 g 5  ? montelukast (SINGULAIR)  10 MG tablet Take 1 tablet (10 mg total) by mouth at bedtime. 30 tablet 5  ? Multiple Vitamins-Minerals (CENTRUM SILVER PO) Take 1 tablet by mouth daily.    ? olopatadine (PATANOL) 0.1 % ophthalmic solution 1 drop 2 (two) times daily.    ? Omega-3 Fatty Acids (FISH OIL) 1200 MG CAPS Take 1,200 mg by mouth 2 (two) times daily.    ? ONETOUCH DELICA LANCETS 54D MISC TEST ONCE DAILY AS DIRECTED    ? Polyethyl Glycol-Propyl Glycol (SYSTANE PRESERVATIVE FREE OP) Apply to eye.    ? Turmeric 500 MG TABS Take 1 tablet by mouth daily with supper.    ? vitamin C (ASCORBIC ACID) 500 MG tablet Take 500 mg by mouth daily.    ? albuterol (PROAIR HFA) 108 (90 Base) MCG/ACT inhaler Inhale 2 puffs into the lungs every 4 (four) hours as needed for wheezing or shortness of breath. (Patient not taking: Reported on  10/03/2021) 18 g 1  ? albuterol (PROVENTIL) (2.5 MG/3ML) 0.083% nebulizer solution Take 3 mLs (2.5 mg total) by nebulization every 4 (four) hours as needed for wheezing or shortness of breath. (Patient not taking: Reported on 10/03/2021) 75 mL 1  ? budesonide-formoterol (SYMBICORT) 160-4.5 MCG/ACT inhaler INHALE 2 PUFFS INTO THE LUNGS DAILY WITH SPACER (Patient not taking: Reported on 10/03/2021) 10.2 g 5  ? ?No current facility-administered medications for this visit.  ?  ? ?Known medication allergies: ?Allergies  ?Allergen Reactions  ? Iodides Itching  ?  TOTAL BODY  ? Peanut Oil Other (See Comments)  ?  Recent allergy testing  ? Shellfish Allergy Other (See Comments)  ?  Per allergy testing  ? Soybean-Containing Drug Products Other (See Comments)  ?  Per allergy testing  ? Vaccinium Angustifolium Other (See Comments)  ?  Per allergy testing  ? Mestinon [Pyridostigmine Bromide] Diarrhea  ?  "Stomach cramps, blood in stool"  ? Penicillins Itching  ?  Has patient had a PCN reaction causing immediate rash, facial/tongue/throat swelling, SOB or lightheadedness with hypotension: no-- pt did not have rash but was itchy all over body ?Has patient had a PCN reaction causing severe rash involving mucus membranes or skin necrosis: no ?Has patient had a PCN reaction that required hospitalization no ?Has patient had a PCN reaction occurring within the last 10 years: no ?If all of the above answers are "NO", then may proceed with Cephalosporin use. ? ?  ? ? ? ?Physical examination: ?Blood pressure 126/74, pulse 64, temperature 98 ?F (36.7 ?C), resp. rate 18, height '4\' 11"'$  (1.499 m), weight 165 lb 4 oz (75 kg), SpO2 99 %. ? ?General: Alert, interactive, in no acute distress. ?HEENT: PERRLA, TMs pearly gray, turbinates minimally edematous without discharge, post-pharynx non erythematous. ?Neck: Supple without lymphadenopathy. ?Lungs: Clear to auscultation without wheezing, rhonchi or rales. {no increased work of  breathing. ?CV: Normal S1, S2 without murmurs. ?Abdomen: Nondistended, nontender. ?Skin: Warm and dry, without lesions or rashes. ?Extremities:  No clubbing, cyanosis or edema. ?Neuro:   Grossly intact. ? ?Diagnositics/Labs: ? ?Spirometry: FEV1: 1.0 L 75%, FVC: 1.21 L 70%, ratio consistent with nonobstructive pattern for age ? ? ?Assessment and plan: ?  ?Moderate persistent asthma ?- Daily asthma medication: Singulair '10mg'$  daily at bedtime ?- Asthma Action plan (if you have a respiratory illness or asthma flare: take Symbicort 160/4.5 2 puffs daily with spacer for 1-2 weeks  ?- Rescue medications: ProAir 4 puffs every 4-6 hours as needed.   ?- Asthma control goals:  ?*  Full participation in all desired activities (may need albuterol before activity) ?* Albuterol use two time or less a week on average (not counting use with activity) ?* Cough interfering with sleep two time or less a month ?* Oral steroids no more than once a year ?* No hospitalizations ? ? Allergic rhinoconjunctivitis ?- Continue Astelin 1- spray twice daily as needed for nasal drainage ?- Continue Nasonex 1-2 sprays each nostril as needed for nasal congestion ?- Continue cetirizine '10mg'$  daily. ? ?Adverse food reactions (peanuts, soy, shellfish) ?- Continue to avoid these foods. ?- EpiPen is up to date.  ? ? ?Return in about 6 months or sooner if needed ?I appreciate the opportunity to take part in Nicole Holland's care. Please do not hesitate to contact me with questions. ? ?Sincerely, ? ? ?Prudy Feeler, MD ?Allergy/Immunology ?[facility] ? ? ?

## 2021-11-29 MED ORDER — MONTELUKAST SODIUM 10 MG PO TABS: 10.0000 mg | ORAL_TABLET | Freq: Every day | ORAL | 5 refills | Status: DC

## 2021-11-29 NOTE — Addendum Note (Signed)
Addended by: Larence Penning on: 11/29/2021 02:10 PM   Modules accepted: Orders

## 2022-02-22 ENCOUNTER — Other Ambulatory Visit: Payer: Self-pay

## 2022-02-22 MED ORDER — AZELASTINE HCL 0.1 % NA SOLN: NASAL | 2 refills | Status: DC

## 2022-04-05 ENCOUNTER — Encounter: Payer: Self-pay | Admitting: Allergy

## 2022-04-05 ENCOUNTER — Ambulatory Visit (INDEPENDENT_AMBULATORY_CARE_PROVIDER_SITE_OTHER): Payer: Medicare Other | Admitting: Allergy

## 2022-04-05 VITALS — BP 104/62 | HR 70 | Temp 98.3°F | Resp 16

## 2022-04-05 DIAGNOSIS — J3089 Other allergic rhinitis: Secondary | ICD-10-CM

## 2022-04-05 DIAGNOSIS — T7800XD Anaphylactic reaction due to unspecified food, subsequent encounter: Secondary | ICD-10-CM | POA: Diagnosis not present

## 2022-04-05 DIAGNOSIS — J4540 Moderate persistent asthma, uncomplicated: Secondary | ICD-10-CM | POA: Diagnosis not present

## 2022-04-05 MED ORDER — AZELASTINE HCL 0.1 % NA SOLN: NASAL | 5 refills | Status: DC

## 2022-04-05 MED ORDER — MOMETASONE FUROATE 50 MCG/ACT NA SUSP: NASAL | 5 refills | Status: DC

## 2022-04-05 MED ORDER — SYMBICORT 160-4.5 MCG/ACT IN AERO: INHALATION_SPRAY | RESPIRATORY_TRACT | 5 refills | Status: DC

## 2022-04-05 MED ORDER — MONTELUKAST SODIUM 10 MG PO TABS: 10.0000 mg | ORAL_TABLET | Freq: Every day | ORAL | 5 refills | Status: DC

## 2022-04-05 MED ORDER — ALBUTEROL SULFATE HFA 108 (90 BASE) MCG/ACT IN AERS: 2.0000 | INHALATION_SPRAY | RESPIRATORY_TRACT | 1 refills | Status: DC | PRN

## 2022-04-05 MED ORDER — EPINEPHRINE 0.3 MG/0.3ML IJ SOAJ: 0.3000 mg | INTRAMUSCULAR | 1 refills | Status: DC | PRN

## 2022-04-05 NOTE — Patient Instructions (Signed)
Moderate persistent asthma - Under good control - Daily asthma medication: Singulair '10mg'$  daily at bedtime - Asthma Action plan (if you have a respiratory illness or asthma flare: take Symbicort 160/4.5 2 puffs daily with spacer for 1-2 weeks  - Rescue medications: ProAir 4 puffs every 4-6 hours as needed.   - Asthma control goals:  * Full participation in all desired activities (may need albuterol before activity) * Albuterol use two time or less a week on average (not counting use with activity) * Cough interfering with sleep two time or less a month * Oral steroids no more than once a year * No hospitalizations   Allergic rhinoconjunctivitis - Use Astelin 1-2 spray twice daily as needed for runny nose - Use Nasonex 1-2 sprays each nostril as needed for stuffy nose - Use cetirizine '10mg'$  daily as needed for allergy symptom relief  Adverse food reactions (peanuts, soy, shellfish) - Continue to avoid these foods. - Have access to Epipen in case of allergic reaction    Return in about 6 months or sooner if needed

## 2022-04-05 NOTE — Progress Notes (Signed)
Follow-up Note  RE: Nicole Holland MRN: [id number] DOB: [birth year] Date of Office Visit: 04/05/2022   History of present illness: Nicole Holland is a 86 y.o. female presenting today for follow-up of asthma, allergic rhinitis with conjunctivitis and adverse food reaction.  She was last seen in the office on 10/03/21 by myself.  She states she has been blessed.  She has not had any hospitalizations or surgeries since last visit.  She is on antibiotic for a cellulitis on her left inner ankle as well as using a topical ointment.  She is not sure where it came from.  Doesn't believe she was bit/stung by anything and has not had any trauma.   With her asthma she states it is doing well with singulair alone.  She states she has not needed to use her albuterol inhaler at all nor has she needed to initiate symbicort use.  She has not required any ED/UC visits or systemic steroid needs since last visit.  With her allergies she states she will use her nose sprays asteline and nasonex if she cant breathe thru the nose especially.  She does note some runny nose as of late.  She continues to take cetirizine daily.   She continues to avoid peanuts, soy and shellfish in the diet and has not needed to use her epipen.    Review of systems: Review of Systems  Constitutional: Negative.   HENT:  Positive for postnasal drip.   Eyes: Negative.   Respiratory: Negative.    Cardiovascular: Negative.   Gastrointestinal: Negative.   Musculoskeletal: Negative.   Skin: Negative.        Swelling and redness of ankle  Allergic/Immunologic: Negative.   Neurological: Negative.      All other systems negative unless noted above in HPI  Past medical/social/surgical/family history have been reviewed and are unchanged unless specifically indicated below.  No changes  Medication List: Current Outpatient Medications  Medication Sig Dispense Refill   albuterol (PROAIR HFA) 108 (90 Base) MCG/ACT inhaler Inhale 2  puffs into the lungs every 4 (four) hours as needed for wheezing or shortness of breath. 18 g 1   albuterol (PROVENTIL) (2.5 MG/3ML) 0.083% nebulizer solution Take 3 mLs (2.5 mg total) by nebulization every 4 (four) hours as needed for wheezing or shortness of breath. 75 mL 1   albuterol (VENTOLIN HFA) 108 (90 Base) MCG/ACT inhaler Inhale 2 puffs into the lungs every 4 (four) hours as needed for wheezing or shortness of breath. 18 g 1   APPLE CIDER VINEGAR PO Take by mouth. gummy     atorvastatin (LIPITOR) 20 MG tablet Take 20 mg by mouth at bedtime.  0   azelastine (ASTELIN) 0.1 % nasal spray Use in each nostril as directed 30 mL 2   azelastine (ASTELIN) 0.1 % nasal spray 1-2 sprays 2 times daily as needed for runny nose. 30 mL 5   BLACK ELDERBERRY PO Take by mouth.     budesonide-formoterol (SYMBICORT) 160-4.5 MCG/ACT inhaler INHALE 2 PUFFS INTO THE LUNGS DAILY WITH SPACER 10.2 g 5   calcium carbonate (OSCAL) 1500 (600 Ca) MG TABS tablet Take by mouth.     CARTIA XT 240 MG 24 hr capsule Take 240 mg by mouth daily.     carvedilol (COREG) 6.25 MG tablet Take 6.25 mg by mouth 2 (two) times daily.  0   cetirizine (ZYRTEC) 10 MG tablet TK 1 T PO QD     Cholecalciferol 25 MCG (1000 UT)  capsule Take by mouth.     EPINEPHrine (EPIPEN 2-PAK) 0.3 mg/0.3 mL IJ SOAJ injection Inject 0.3 mg into the muscle as needed for anaphylaxis. 0.3 mL 1   EPINEPHrine 0.3 mg/0.3 mL IJ SOAJ injection Inject 0.3 mg into the muscle as needed for anaphylaxis. 1 each 1   FEROSUL 325 (65 Fe) MG tablet Take 325 mg by mouth daily.     furosemide (LASIX) 40 MG tablet Take 20 mg by mouth 2 (two) times daily.  0   glucose blood test strip TEST once daily     guaiFENesin (MUCINEX) 600 MG 12 hr tablet Take 600 mg by mouth 2 (two) times daily.     Lactobacillus (PROBIOTIC ACIDOPHILUS PO) Take by mouth.     latanoprost (XALATAN) 0.005 % ophthalmic solution Place 1 drop into both eyes at bedtime.     LATANOPROST OP Apply to eye at  bedtime. 0.1%     losartan (COZAAR) 25 MG tablet Take 25 mg by mouth daily.  0   mometasone (NASONEX) 50 MCG/ACT nasal spray Place 2 sprays into the nose daily. 17 g 5   mometasone (NASONEX) 50 MCG/ACT nasal spray 1-2 sprays each nostril as needed for stuffy nose. 17 g 5   montelukast (SINGULAIR) 10 MG tablet Take 1 tablet (10 mg total) by mouth at bedtime. 30 tablet 5   montelukast (SINGULAIR) 10 MG tablet Take 1 tablet (10 mg total) by mouth at bedtime. 30 tablet 5   Multiple Vitamins-Minerals (CENTRUM SILVER PO) Take 1 tablet by mouth daily.     olopatadine (PATANOL) 0.1 % ophthalmic solution 1 drop 2 (two) times daily.     Omega-3 Fatty Acids (FISH OIL) 1200 MG CAPS Take 1,200 mg by mouth 2 (two) times daily.     ONETOUCH DELICA LANCETS 97Q MISC TEST ONCE DAILY AS DIRECTED     Polyethyl Glycol-Propyl Glycol (SYSTANE PRESERVATIVE FREE OP) Apply to eye.     SYMBICORT 160-4.5 MCG/ACT inhaler 2 puffs 2 times daily for 1-2 weeks during asthma flare symptoms. 10.2 g 5   Turmeric 500 MG TABS Take 1 tablet by mouth daily with supper.     vitamin C (ASCORBIC ACID) 500 MG tablet Take 500 mg by mouth daily.     No current facility-administered medications for this visit.     Known medication allergies: Allergies  Allergen Reactions   Iodides Itching    TOTAL BODY   Peanut Oil Other (See Comments)    Recent allergy testing   Shellfish Allergy Other (See Comments)    Per allergy testing   Soybean-Containing Drug Products Other (See Comments)    Per allergy testing   Vaccinium Angustifolium Other (See Comments)    Per allergy testing   Mestinon [Pyridostigmine Bromide] Diarrhea    "Stomach cramps, blood in stool"   Penicillins Itching    Has patient had a PCN reaction causing immediate rash, facial/tongue/throat swelling, SOB or lightheadedness with hypotension: no-- pt did not have rash but was itchy all over body Has patient had a PCN reaction causing severe rash involving mucus membranes  or skin necrosis: no Has patient had a PCN reaction that required hospitalization no Has patient had a PCN reaction occurring within the last 10 years: no If all of the above answers are "NO", then may proceed with Cephalosporin use.       Physical examination: Blood pressure 104/62, pulse 70, temperature 98.3 F (36.8 C), temperature source Temporal, resp. rate 16, SpO2 98 %.  General:  Alert, interactive, in no acute distress. HEENT: PERRLA, TMs pearly gray, turbinates non-edematous without discharge, post-pharynx non erythematous. Neck: Supple without lymphadenopathy. Lungs: Clear to auscultation without wheezing, rhonchi or rales. {no increased work of breathing. CV: Normal S1, S2 without murmurs. Abdomen: Nondistended, nontender. Skin: Left medial ankle above malleolus with erythematous round area . Extremities:  No clubbing, cyanosis or edema. Neuro:   Grossly intact.  Diagnositics/Labs: Spirometry: FEV1: 0.92L 70%, FVC: 1.42L 83%, ratio consistent with nonobstructive pattern (normal for age/demographic)  Assessment and plan: Moderate persistent asthma - Under good control - Daily asthma medication: Singulair '10mg'$  daily at bedtime - Asthma Action plan (if you have a respiratory illness or asthma flare: take Symbicort 160/4.5 2 puffs daily with spacer for 1-2 weeks  - Rescue medications: ProAir 4 puffs every 4-6 hours as needed.   - Asthma control goals:  * Full participation in all desired activities (may need albuterol before activity) * Albuterol use two time or less a week on average (not counting use with activity) * Cough interfering with sleep two time or less a month * Oral steroids no more than once a year * No hospitalizations   Allergic rhinoconjunctivitis - Use Astelin 1-2 spray twice daily as needed for runny nose - Use Nasonex 1-2 sprays each nostril as needed for stuffy nose - Use cetirizine '10mg'$  daily as needed for allergy symptom relief  Adverse food  reactions (peanuts, soy, shellfish) - Continue to avoid these foods. - Have access to Epipen in case of allergic reaction    Return in about 6 months or sooner if needed  I appreciate the opportunity to take part in Clydean's care. Please do not hesitate to contact me with questions.  Sincerely,   Prudy Feeler, MD Allergy/Immunology [facility]

## 2022-04-10 ENCOUNTER — Other Ambulatory Visit (HOSPITAL_COMMUNITY): Payer: Self-pay

## 2022-04-10 ENCOUNTER — Telehealth: Payer: Self-pay

## 2022-04-10 NOTE — Telephone Encounter (Signed)
Received PA request for Epipen 0.'3mg'$ /0.55m through [facility].  PA not needed, covered for max daily dose of 0.133334 ml.

## 2022-05-11 ENCOUNTER — Other Ambulatory Visit: Payer: Self-pay | Admitting: Allergy

## 2022-09-03 NOTE — Progress Notes (Unsigned)
PATIENT: Nicole Holland DOB: [birth year]  REASON FOR VISIT: follow up HISTORY FROM: patient  No chief complaint on file.   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 She discontinued Cellcept in 2022. She was doing well at last visit 08/2021. 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 She wished to discontinue Cellcept and was advised to do so at last visit with Dr [name] 02/2021. She reports doing very well. No weakness or vision changes noted. She denies generalized weakness. No trouble swallowing. She is followed by PCP regularly. She had a normal eye exam in 10/2020 and has scheduled follow up with ophthalmology 10/2021. She does endorse more allergy symptoms of watery eyes during season changes. She is able to drive without difficulty. No falls.   03/05/2021 AA:  She is not having droopy eyes or double vision. Appears her myasthenia gravis never generalized and is doing well. She is not having any symptoms.  Today she still has some slight ptosis bilaterally, but extraocular movements intact without any fatigable upgaze.  Last time she was here she did not want to continue with the CellCept, and at that time I told her we could try being off the CellCept.  Today she does not remember that, we had the same discussion today, I am okay if she wants to come off of the CellCept.  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  At this time since she has been generalized its less likely she will, she will probably stay with ocular myasthenia.   08/28/2020 AA:  She has been on Cellcept for ocular myasthenia gravis. Not generalized. She Doesn't want to take the cellcept anymore. We discussed if she has ptosis or weakness or difficulty breathing or any other issues need to see me right away or if having difficulty with walking or breathing she should go to the ED, risk of  exacerbation  08/26/2019 ALL:  Nicole Holland is a 87 y.o. female here today for follow up for occular MG.  She continues to do well with CellCept 1000 mg twice daily.  She denies any adverse effects with medications.  She denies any vision changes.  No double vision.  She denies eye drooping.  No changes in appetite or difficulty swallowing.  No weakness noted.  No changes in her.  She denies falls.  She is followed closely by primary care.  08/25/2018 ALL: She is doing great. No symptoms. She is tolerating cell cept with no obvious adverse effects. She is trying to stay active. She is following up regularly with PCP, allergist, nephrologist and ophthalmologist for chronic disease management. CT chest negative for thymoma. She could not tolerate Mestinon.    02/24/2018 AA: here for follow up on myasthenia Gravis. Doing well on Cellcept. No symptoms even when she was sick. No diplopia, no difficulty swallowing, continue cellcept.   09/10/2017 AA: Doing well, no double vision, feels improved. No side effects to the medication. Taking medication regularly. No SOB, no double vision, no weakness.    03/12/2017 AA:  Nicole Holland is a 87 y.o. female here as a referral from Dr. Lavone Neri for Myasthenia Gravis.ACHR binding antibody serum was positive. No double vision. Feels improved. She has finished her prednisone.  Will increase CellCept 1000 mg twice a day which is the preferred dose. Would prefer patient to come back in  3 months however she declines, so she does not want to come back for 6 months. We will do labs monthly until she sees me again. Discussed side effects again and asked her to please call our office if there is any side effects whatsoever.   12/09/2016 AA:  [name] is a 87 y.o. female here as a referral from Dr. Lavone Neri for Myasthenia Gravis.ACHR binding antibody serum was positive.  I had a long talk with her at last appointment however I don't the patient understands there may be  memory problems. Asked her son to come along with her that she is here alone again today. I asked her to explain to me what is going on and she said "I really don't know what is going on". She had a long appointment with the ophthalmologist to discuss MG and also I spoke to her about MG but appears she has memory problems and a vague understanding.    Nicole Holland [facility]   HPI:  Nicole Holland is a 87 y.o. 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 She noticed eyelid drooping for a year. The eye symptoms don;t change with time of day, not better in the morning or evening. Pollen makes it worse, night driving is hard. No significant improvement after a nap. The left eye is worse. It is stable. People think she is asleep when she is not actually asleep. No double vision. No difficulty swallowing, breathing, no voice changing. Pollen season has been bad. No voice changes. Her legs try to give out on her but that's been "going on forever" no new muscle or new difficulties lifting arms overhead or climbing stairs. She can comb and style hair, brush teeth, can get out of seats. No neck weakness.    Reviewed notes, labs and imaging from outside physicians, which showed:    Drooping of the right eye and left eye. Patient's [facility]. Eyelids have been drooping for the past year and getting worse, causing I was to feel heavy and rubs slightly as the eyes. She is not able to see as clearly as she used to. She feels is progressively getting worse. Eyes have been tearing and burning which makes vision blurry sometimes. She can't see as clear as she would like to due to her eyelids drooping down in her eyes very bothersome. In driving at night has become a problem. Reviewed exam which showed a 20/60 in 2040 distance 20/50 in 20/30 near, patient was not dilated, intraocular pressure is normal, pupils equally round and reactive to light no APD, open angles,  extraocular movements intact, myogenic ptosis, anterior chamber is normal, adnexa normal. They were referred here for EMG. Myasthenia panel was sent and a ACHR binding antibody serum was positive.    Reviewed labs: Receptor binding antibodies were elevated at 0.29 otherwise blocking antibodies and modulating antibodies were negative.   REVIEW OF SYSTEMS: Out of a complete 14 system review of symptoms, the patient complains only of the following symptoms, arthritis, seasonal allergies and all other reviewed systems are negative.  ALLERGIES: Allergies  Allergen Reactions   Iodides Itching    TOTAL BODY   Peanut Oil Other (See Comments)    Recent allergy testing   Shellfish Allergy Other (See Comments)    Per allergy testing   Soybean-Containing Drug Products Other (See Comments)    Per allergy testing   Vaccinium Angustifolium Other (See Comments)    Per allergy testing  Mestinon [Pyridostigmine Bromide] Diarrhea    "Stomach cramps, blood in stool"   Penicillins Itching    Has patient had a PCN reaction causing immediate rash, facial/tongue/throat swelling, SOB or lightheadedness with hypotension: no-- pt did not have rash but was itchy all over body Has patient had a PCN reaction causing severe rash involving mucus membranes or skin necrosis: no Has patient had a PCN reaction that required hospitalization no Has patient had a PCN reaction occurring within the last 10 years: no If all of the above answers are "NO", then may proceed with Cephalosporin use.      HOME MEDICATIONS: Outpatient Medications Prior to Visit  Medication Sig Dispense Refill   albuterol (PROAIR HFA) 108 (90 Base) MCG/ACT inhaler Inhale 2 puffs into the lungs every 4 (four) hours as needed for wheezing or shortness of breath. 18 g 1   albuterol (PROVENTIL) (2.5 MG/3ML) 0.083% nebulizer solution Take 3 mLs (2.5 mg total) by nebulization every 4 (four) hours as needed for wheezing or shortness of breath. 75 mL 1    albuterol (VENTOLIN HFA) 108 (90 Base) MCG/ACT inhaler Inhale 2 puffs into the lungs every 4 (four) hours as needed for wheezing or shortness of breath. 18 g 1   APPLE CIDER VINEGAR PO Take by mouth. gummy     atorvastatin (LIPITOR) 20 MG tablet Take 20 mg by mouth at bedtime.  0   azelastine (ASTELIN) 0.1 % nasal spray Use in each nostril as directed 30 mL 2   azelastine (ASTELIN) 0.1 % nasal spray 1-2 sprays 2 times daily as needed for runny nose. 30 mL 5   BLACK ELDERBERRY PO Take by mouth.     budesonide-formoterol (SYMBICORT) 160-4.5 MCG/ACT inhaler INHALE 2 PUFFS INTO THE LUNGS DAILY WITH SPACER 10.2 g 5   calcium carbonate (OSCAL) 1500 (600 Ca) MG TABS tablet Take by mouth.     CARTIA XT 240 MG 24 hr capsule Take 240 mg by mouth daily.     carvedilol (COREG) 6.25 MG tablet Take 6.25 mg by mouth 2 (two) times daily.  0   cetirizine (ZYRTEC) 10 MG tablet TK 1 T PO QD     Cholecalciferol 25 MCG (1000 UT) capsule Take by mouth.     EPINEPHrine (EPIPEN 2-PAK) 0.3 mg/0.3 mL IJ SOAJ injection Inject 0.3 mg into the muscle as needed for anaphylaxis. 0.3 mL 1   EPINEPHrine 0.3 mg/0.3 mL IJ SOAJ injection Inject 0.3 mg into the muscle as needed for anaphylaxis. 1 each 1   FEROSUL 325 (65 Fe) MG tablet Take 325 mg by mouth daily.     furosemide (LASIX) 40 MG tablet Take 20 mg by mouth 2 (two) times daily.  0   glucose blood test strip TEST once daily     guaiFENesin (MUCINEX) 600 MG 12 hr tablet Take 600 mg by mouth 2 (two) times daily.     Lactobacillus (PROBIOTIC ACIDOPHILUS PO) Take by mouth.     latanoprost (XALATAN) 0.005 % ophthalmic solution Place 1 drop into both eyes at bedtime.     LATANOPROST OP Apply to eye at bedtime. 0.1%     losartan (COZAAR) 25 MG tablet Take 25 mg by mouth daily.  0   mometasone (NASONEX) 50 MCG/ACT nasal spray Place 2 sprays into the nose daily. 17 g 5   mometasone (NASONEX) 50 MCG/ACT nasal spray 1-2 sprays each nostril as needed for stuffy nose. 17 g 5    montelukast (SINGULAIR) 10 MG tablet Take 1  tablet (10 mg total) by mouth at bedtime. 30 tablet 5   montelukast (SINGULAIR) 10 MG tablet Take 1 tablet (10 mg total) by mouth at bedtime. 30 tablet 5   Multiple Vitamins-Minerals (CENTRUM SILVER PO) Take 1 tablet by mouth daily.     olopatadine (PATANOL) 0.1 % ophthalmic solution 1 drop 2 (two) times daily.     Omega-3 Fatty Acids (FISH OIL) 1200 MG CAPS Take 1,200 mg by mouth 2 (two) times daily.     ONETOUCH DELICA LANCETS 99991111 MISC TEST ONCE DAILY AS DIRECTED     Polyethyl Glycol-Propyl Glycol (SYSTANE PRESERVATIVE FREE OP) Apply to eye.     SYMBICORT 160-4.5 MCG/ACT inhaler 2 puffs 2 times daily for 1-2 weeks during asthma flare symptoms. 10.2 g 5   Turmeric 500 MG TABS Take 1 tablet by mouth daily with supper.     vitamin C (ASCORBIC ACID) 500 MG tablet Take 500 mg by mouth daily.     No facility-administered medications prior to visit.    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 difficile diarrhea 2019   Gout    Hyperlipidemia    Hypertension    S/P radiation therapy 10/28/11 - 12/02/11   Right Breast/ 5000cGy/ 25 Fractions   Sinus infection 2019   Use of tamoxifen (Nolvadex) 12/04/11   Dr. Truddie Coco   Vitamin D deficiency     PAST SURGICAL HISTORY: Past Surgical History:  Procedure Laterality Date   ABDOMINAL HYSTERECTOMY  1994   partial ovaries intact   BREAST LUMPECTOMY  08/02/11   Lumpectomy right Breast; Low Grade Ductal Carcinoma In Situ with Associated Calcifications, ER 65%, PR 99%   BREAST SURGERY  02/01/2000   right breast subareolar core biopsy   BREAST SURGERY  09/27/11   Right Breast; Excision of  Anterior Margin- Benign Breast Parenchyma, Negative for In situ and Invasive Cancer, Surgical Margins Negative, Benign skin, Negative for Malignancy   CATARACT EXTRACTION, BILATERAL  2015, 2016    CESAREAN SECTION     eye lift     laser surgery left eye  08/14/2020   TONSILLECTOMY      FAMILY HISTORY: Family History  Problem Relation Age of Onset   Stroke Mother    Diabetes Mother    Cancer Mother    Cancer Father        Lung cancer   Diabetes Sister    Cancer Sister        Unknown Cancer   Diabetes Brother    Cancer Brother        Unknown cancer   Diabetes Brother    Cancer Brother        Unknown cancer   Cancer Brother        Unknown Cancer   Diabetes Brother    Cancer Brother        Unknown Cancer   Autoimmune disease Neg Hx     SOCIAL HISTORY: Social History   Socioeconomic History   Marital status: Married    Spouse name: separated, still married   Number of children: 4   Years of education: 12   Highest education level: Not on file  Occupational History    Comment: NA  Tobacco Use   Smoking status: Never   Smokeless tobacco: Never  Vaping Use   Vaping Use:  Never used  Substance and Sexual Activity   Alcohol use: No   Drug use: No   Sexual activity: Never    Birth control/protection: Post-menopausal    Comment: mensus age 71,G5P4, 36st preg age 84, b.c short time,couldn't tolerate  Other Topics Concern   Not on file  Social History Narrative   Lives alone (son and daughter-in-law are nearby)   Caffeine- none   Right handed   Social Determinants of Health   Financial Resource Strain: Not on file  Food Insecurity: Not on file  Transportation Needs: Not on file  Physical Activity: Not on file  Stress: Not on file  Social Connections: Not on file  Intimate Partner Violence: Not on file      PHYSICAL EXAM  There were no vitals filed for this visit.   There is no height or weight on file to calculate BMI.  Generalized: Well developed, in no acute distress  Cardiology: normal rate and rhythm, no murmur noted Neurological examination  Mentation: Alert oriented to time, place, history taking. Follows all commands speech and  language fluent Cranial nerve II-XII: Pupils were equal round reactive to light. Extraocular movements were full, visual field were full on confrontational test. She denies fatigue with 30 second upward gaze, no noted ptosis. Facial sensation and strength were normal. Uvula tongue midline. Head turning and shoulder shrug  were normal and symmetric. No ptosis noted with 30 second gaze, patient denies diplopia.  Motor: The motor testing reveals 5 over 5 strength of all 4 extremities. Good symmetric motor tone is noted throughout.  Gait and station: Able to push to standing position. Gait is slow and arthritic but stable without assistive device.    DIAGNOSTIC DATA (LABS, IMAGING, TESTING) - I reviewed patient records, labs, notes, testing and imaging myself where available.      No data to display           Lab Results  Component Value Date   WBC 8.0 08/11/2017   HGB 10.1 (L) 08/11/2017   HCT 30.3 (L) 08/11/2017   MCV 82 08/11/2017   PLT 203 08/11/2017      Component Value Date/Time   NA 143 08/11/2017 1025   NA 140 06/03/2014 1001   K 3.7 08/11/2017 1025   K 4.0 06/03/2014 1001   CL 102 08/11/2017 1025   CL 103 04/23/2012 1023   CO2 25 08/11/2017 1025   CO2 26 06/03/2014 1001   GLUCOSE 118 (H) 08/11/2017 1025   GLUCOSE 74 09/04/2015 0527   GLUCOSE 105 06/03/2014 1001   GLUCOSE 130 (H) 04/23/2012 1023   BUN 29 (H) 08/11/2017 1025   BUN 23.6 06/03/2014 1001   CREATININE 1.88 (H) 08/11/2017 1025   CREATININE 1.7 (H) 06/03/2014 1001   CALCIUM 9.7 08/11/2017 1025   CALCIUM 10.5 (H) 06/03/2014 1001   PROT 6.4 08/11/2017 1025   PROT 6.9 06/03/2014 1001   ALBUMIN 4.0 08/11/2017 1025   ALBUMIN 3.6 06/03/2014 1001   AST 16 08/11/2017 1025   AST 62 (H) 06/03/2014 1001   ALT 9 08/11/2017 1025   ALT 62 (H) 06/03/2014 1001   ALKPHOS 71 08/11/2017 1025   ALKPHOS 54 06/03/2014 1001   BILITOT 0.6 08/11/2017 1025   BILITOT 0.59 06/03/2014 1001   GFRNONAA 25 (L) 08/11/2017 1025    GFRAA 29 (L) 08/11/2017 1025   No results found for: "CHOL", "HDL", "LDLCALC", "LDLDIRECT", "TRIG", "CHOLHDL" No results found for: "HGBA1C" No results found for: "VITAMINB12" No results found for: "  TSH"     ASSESSMENT AND PLAN 87 y.o. year old female  has a past medical history of Arthritis, Asthma, Breast cancer ([place]) (08/02/11), C. difficile diarrhea (2019), Gout, Hyperlipidemia, Hypertension, S/P radiation therapy (10/28/11 - 12/02/11), Sinus infection (2019), Use of tamoxifen (Nolvadex) (12/04/11), and Vitamin D deficiency. here with   No diagnosis found.    Ernie is doing very well, today. She wishes to remain off Cellcept.  She is followed by ophthalmology closely.  There have been no visual disturbances or vision changes. No trouble swallowing or generalized weakness.  She is followed closely by primary care and nephrology. Labs are performed regularly.  She was advised to stay well-hydrated, eat a healthy well-balanced diet and try to stay physically active.  She will follow-up in 1 year, sooner if needed.  She verbalizes understanding and agreement with this plan.   No orders of the defined types were placed in this encounter.    No orders of the defined types were placed in this encounter.     Debbora Presto, FNP-C 09/03/2022, 10:52 AM [facility] [address], [place], [place] [address] [phone]

## 2022-09-03 NOTE — Patient Instructions (Signed)
Below is our plan:  We will continue to monitor. Continue close follow up with your care team. Let me know if you need me for anything.   Please make sure you are staying well hydrated. I recommend 50-60 ounces daily. Well balanced diet and regular exercise encouraged. Consistent sleep schedule with 6-8 hours recommended.   Please continue follow up with care team as directed.   Follow up with me as needed   You may receive a survey regarding today's visit. I encourage you to leave honest feed back as I do use this information to improve patient care. Thank you for seeing me today!

## 2022-09-05 ENCOUNTER — Encounter: Payer: Self-pay | Admitting: Family Medicine

## 2022-09-05 ENCOUNTER — Ambulatory Visit (INDEPENDENT_AMBULATORY_CARE_PROVIDER_SITE_OTHER): Payer: Medicare Other | Admitting: Family Medicine

## 2022-09-05 VITALS — BP 138/59 | HR 55 | Ht 59.0 in | Wt 163.5 lb

## 2022-09-05 DIAGNOSIS — G7000 Myasthenia gravis without (acute) exacerbation: Secondary | ICD-10-CM

## 2022-10-09 ENCOUNTER — Encounter: Payer: Self-pay | Admitting: Allergy

## 2022-10-09 ENCOUNTER — Ambulatory Visit (INDEPENDENT_AMBULATORY_CARE_PROVIDER_SITE_OTHER): Payer: Medicare Other | Admitting: Allergy

## 2022-10-09 VITALS — BP 110/58 | HR 56 | Temp 98.2°F | Resp 16

## 2022-10-09 DIAGNOSIS — T7800XD Anaphylactic reaction due to unspecified food, subsequent encounter: Secondary | ICD-10-CM | POA: Diagnosis not present

## 2022-10-09 DIAGNOSIS — J3089 Other allergic rhinitis: Secondary | ICD-10-CM

## 2022-10-09 DIAGNOSIS — J4540 Moderate persistent asthma, uncomplicated: Secondary | ICD-10-CM | POA: Diagnosis not present

## 2022-10-09 NOTE — Patient Instructions (Addendum)
Moderate persistent asthma - Under good control - Daily asthma medication: Singulair  daily at bedtime - Asthma Action plan (if you have a respiratory illness or asthma flare: take Symbicort 160/4.5 2 puffs daily with spacer for 1-2 weeks  - Rescue medications: ProAir 4 puffs every 4-6 hours as needed.   - Asthma control goals:  * Full participation in all desired activities (may need albuterol before activity) * Albuterol use two time or less a week on average (not counting use with activity) * Cough interfering with sleep two time or less a month * Oral steroids no more than once a year * No hospitalizations   Allergic rhinoconjunctivitis - Use Astelin up to 2 sprays twice daily as needed for runny nose - Use Nasonex up to 2 sprays each nostril as needed for stuffy nose - Use cetirizine  daily as needed for allergy symptom relief  Adverse food reactions (peanuts, soy, shellfish) - Continue to avoid these foods. - Have access to Epipen in case of allergic reaction    Return in about 6 months or sooner if needed

## 2022-10-09 NOTE — Progress Notes (Signed)
Follow-up Note  RE: Nicole Holland MRN: [id number] DOB: Oct 20, 1935 Date of Office Visit: 10/09/2022   History of present illness: Nicole Holland is a 87 y.o. female presenting today for follow-up of asthma, allergic rhinitis rhinoconjunctivitis and food allergy.  She was last seen in the office on 04/05/2022 by myself.  She has had eye surgery of the left eye last Tuesday and of her right eye yesterday.  She states both of these procedures went well.  She is currently using several different eyedrops multiple times a day and she does have a checkoff for when she uses the eyedrop. She states she has been doing great and she is blessed and highly favored. She states she has not needed to use her albuterol since her last visit.  She is not having any daytime or nighttime symptoms.  She is not had to go to the ED or urgent care for her breathing where she required systemic steroid needs.  She continues to take her Singulair daily.  She will use the Symbicort if needed for an asthma flare which she has not needed to do. With her allergies she is noting runny nose however she states that the Astelin, Nasonex and cetirizine still are quite helpful for her. He continues to avoid peanuts, soy and shellfish and has not needed to use her epinephrine device  Review of systems: Review of Systems  Constitutional: Negative.   HENT:  Positive for postnasal drip.   Eyes: Negative.   Respiratory: Negative.    Cardiovascular: Negative.   Gastrointestinal: Negative.   Musculoskeletal: Negative.   Skin: Negative.   Allergic/Immunologic: Negative.   Neurological: Negative.      All other systems negative unless noted above in HPI  Past medical/social/surgical/family history have been reviewed and are unchanged unless specifically indicated below.  No changes  Medication List: Current Outpatient Medications  Medication Sig Dispense Refill   albuterol (VENTOLIN HFA) 108 (90 Base) MCG/ACT inhaler  Inhale 2 puffs into the lungs every 4 (four) hours as needed for wheezing or shortness of breath. 18 g 1   APPLE CIDER VINEGAR PO Take by mouth. gummy     atorvastatin (LIPITOR) 20 MG tablet Take 20 mg by mouth at bedtime.  0   azelastine (ASTELIN) 0.1 % nasal spray Use in each nostril as directed 30 mL 2   BLACK ELDERBERRY PO Take by mouth.     budesonide-formoterol (SYMBICORT) 160-4.5 MCG/ACT inhaler INHALE 2 PUFFS INTO THE LUNGS DAILY WITH SPACER 10.2 g 5   carvedilol (COREG) 6.25 MG tablet Take 6.25 mg by mouth 2 (two) times daily.  0   cetirizine (ZYRTEC) 10 MG tablet TK 1 T PO QD     dorzolamide (TRUSOPT) 2 % ophthalmic solution Place 1 drop into both eyes 3 (three) times daily. 1 drop both eyes in morning and at dinner     EPINEPHrine (EPIPEN 2-PAK) 0.3 mg/0.3 mL IJ SOAJ injection Inject 0.3 mg into the muscle as needed for anaphylaxis. 0.3 mL 1   FEROSUL 325 (65 Fe) MG tablet Take 325 mg by mouth daily.     furosemide (LASIX) 40 MG tablet Take 20 mg by mouth 2 (two) times daily.  0   glucose blood test strip TEST once daily     guaiFENesin (MUCINEX) 600 MG 12 hr tablet Take 600 mg by mouth 2 (two) times daily.     Lactobacillus (PROBIOTIC ACIDOPHILUS PO) Take by mouth.     latanoprost (XALATAN) 0.005 %  ophthalmic solution Place 1 drop into both eyes at bedtime.     losartan (COZAAR) 25 MG tablet Take 25 mg by mouth daily.  0   montelukast (SINGULAIR) 10 MG tablet Take 1 tablet (10 mg total) by mouth at bedtime. 30 tablet 5   Multiple Vitamins-Minerals (CENTRUM SILVER PO) Take 1 tablet by mouth daily.     olopatadine (PATANOL) 0.1 % ophthalmic solution 1 drop 2 (two) times daily.     Omega-3 Fatty Acids (FISH OIL) 1200 MG CAPS Take 1,200 mg by mouth 2 (two) times daily.     ONETOUCH DELICA LANCETS 33G MISC TEST ONCE DAILY AS DIRECTED     Polyethyl Glycol-Propyl Glycol (SYSTANE PRESERVATIVE FREE OP) Apply to eye.     SYMBICORT 160-4.5 MCG/ACT inhaler 2 puffs 2 times daily for 1-2 weeks  during asthma flare symptoms. 10.2 g 5   Turmeric 500 MG TABS Take 1 tablet by mouth daily with supper.     vitamin C (ASCORBIC ACID) 500 MG tablet Take 500 mg by mouth daily.     albuterol (PROVENTIL) (2.5 MG/3ML) 0.083% nebulizer solution Take 3 mLs (2.5 mg total) by nebulization every 4 (four) hours as needed for wheezing or shortness of breath. (Patient not taking: Reported on 09/05/2022) 75 mL 1   No current facility-administered medications for this visit.     Known medication allergies: Allergies  Allergen Reactions   Iodides Itching    TOTAL BODY   Peanut Oil Other (See Comments)    Recent allergy testing   Shellfish Allergy Other (See Comments)    Per allergy testing   Soybean-Containing Drug Products Other (See Comments)    Per allergy testing   Vaccinium Angustifolium Other (See Comments)    Per allergy testing   Mestinon [Pyridostigmine Bromide] Diarrhea    "Stomach cramps, blood in stool"   Penicillins Itching    Has patient had a PCN reaction causing immediate rash, facial/tongue/throat swelling, SOB or lightheadedness with hypotension: no-- pt did not have rash but was itchy all over body Has patient had a PCN reaction causing severe rash involving mucus membranes or skin necrosis: no Has patient had a PCN reaction that required hospitalization no Has patient had a PCN reaction occurring within the last 10 years: no If all of the above answers are "NO", then may proceed with Cephalosporin use.       Physical examination: Blood pressure (!) 110/58, pulse (!) 56, temperature 98.2 F (36.8 C), temperature source Temporal, resp. rate 16, SpO2 99 %.  General: Alert, interactive, in no acute distress. HEENT: PERRLA, TMs pearly gray, turbinates minimally edematous without discharge, post-pharynx non erythematous. Neck: Supple without lymphadenopathy. Lungs: Clear to auscultation without wheezing, rhonchi or rales. {no increased work of breathing. CV: Normal S1, S2  without murmurs. Abdomen: Nondistended, nontender. Skin: Warm and dry, without lesions or rashes. Extremities:  No clubbing, cyanosis or edema. Neuro:   Grossly intact.  Diagnositics/Labs: None today  Assessment and plan: Moderate persistent asthma - Under good control still - Daily asthma medication: Singulair  daily at bedtime - Asthma Action plan (if you have a respiratory illness or asthma flare: take Symbicort 160/4.5 2 puffs daily with spacer for 1-2 weeks  - Rescue medications: ProAir 4 puffs every 4-6 hours as needed.   - Asthma control goals:  * Full participation in all desired activities (may need albuterol before activity) * Albuterol use two time or less a week on average (not counting use with activity) * Cough  interfering with sleep two time or less a month * Oral steroids no more than once a year * No hospitalizations   Allergic rhinoconjunctivitis - Use Astelin up to 2 sprays twice daily as needed for runny nose - Use Nasonex up to 2 sprays each nostril as needed for stuffy nose - Use cetirizine 10mg  daily as needed for allergy symptom relief  Adverse food reactions (peanuts, soy, shellfish) - Continue to avoid these foods. - Have access to Epipen in case of allergic reaction    Return in about 6 months or sooner if needed  I appreciate the opportunity to take part in Nicole Holland's care. Please do not hesitate to contact me with questions.  Sincerely,   Margo Aye, MD Allergy/Immunology [facility]

## 2022-11-15 ENCOUNTER — Other Ambulatory Visit: Payer: Self-pay | Admitting: Allergy

## 2022-11-28 ENCOUNTER — Other Ambulatory Visit: Payer: Self-pay | Admitting: Allergy

## 2022-12-25 ENCOUNTER — Other Ambulatory Visit: Payer: Self-pay | Admitting: Allergy

## 2023-04-10 ENCOUNTER — Ambulatory Visit: Payer: Medicare Other | Admitting: Allergy

## 2023-04-10 ENCOUNTER — Encounter: Payer: Self-pay | Admitting: Allergy

## 2023-04-10 ENCOUNTER — Other Ambulatory Visit: Payer: Self-pay

## 2023-04-10 VITALS — BP 144/64 | HR 51 | Temp 98.3°F | Resp 18 | Wt 159.1 lb

## 2023-04-10 DIAGNOSIS — J4540 Moderate persistent asthma, uncomplicated: Secondary | ICD-10-CM | POA: Diagnosis not present

## 2023-04-10 DIAGNOSIS — J3089 Other allergic rhinitis: Secondary | ICD-10-CM | POA: Diagnosis not present

## 2023-04-10 DIAGNOSIS — T7800XD Anaphylactic reaction due to unspecified food, subsequent encounter: Secondary | ICD-10-CM | POA: Diagnosis not present

## 2023-04-10 MED ORDER — SYMBICORT 160-4.5 MCG/ACT IN AERO: INHALATION_SPRAY | RESPIRATORY_TRACT | 5 refills | Status: AC

## 2023-04-10 MED ORDER — CETIRIZINE HCL 10 MG PO TABS: 10.0000 mg | ORAL_TABLET | Freq: Every day | ORAL | 5 refills | Status: DC

## 2023-04-10 MED ORDER — AZELASTINE HCL 0.1 % NA SOLN: NASAL | 5 refills | Status: DC

## 2023-04-10 MED ORDER — MOMETASONE FUROATE 50 MCG/ACT NA SUSP: NASAL | 5 refills | Status: DC

## 2023-04-10 MED ORDER — ALBUTEROL SULFATE HFA 108 (90 BASE) MCG/ACT IN AERS: 2.0000 | INHALATION_SPRAY | RESPIRATORY_TRACT | 1 refills | Status: AC | PRN

## 2023-04-10 MED ORDER — MONTELUKAST SODIUM 10 MG PO TABS: 10.0000 mg | ORAL_TABLET | Freq: Every day | ORAL | 5 refills | Status: DC

## 2023-04-10 NOTE — Progress Notes (Signed)
Follow-up Note  RE: VIVIANE GARATE MRN: [id number] DOB: Jan 18, 1936 Date of Office Visit: 04/10/2023   History of present illness: Nicole Holland is a 87 y.o. female presenting today for follow-up of asthma, allergic rhinitis with conjunctivitis and adverse food reaction.  She was last seen in the office on 10/09/2022 by myself.  Discussed the use of AI scribe software for clinical note transcription with the patient, who gave verbal consent to proceed.  She has been managing well since the last visit in April. She reports no need for hospitalization, emergency room visits, or surgeries. She has not required the use of her rescue inhaler, indicating good control of her respiratory symptoms. She denies any episodes of excessive coughing, shortness of breath, or wheezing. She has not required any steroids or prednisone for her respiratory condition. She received her flu shot in September and continues to use her nasal sprays, Astelin and Nasonex, daily, which have been effective in managing her nasal symptoms. She also takes Singulair at night, which she reports has been beneficial.  She also continues on cetirizine daily as needed for her allergy control.  She continues to avoid peanuts, shellfish, and soy. She has not needed to use her EpiPen.   She also reports needing a new hearing aid after a recent hearing test. She denies any current issues with her hearing.     She reports being told she was dehydrated in May, despite her regular water intake. She was advised to drink sixty-four ounces of water daily, which she tried her best to comply with however over she reports frequent urination due to the increased water intake.  Review of systems: 10pt ROS  negative unless noted above in HPI  All other systems negative unless noted above in HPI  Past medical/social/surgical/family history have been reviewed and are unchanged unless specifically indicated below.  No changes  Medication  List: Current Outpatient Medications  Medication Sig Dispense Refill   albuterol (PROVENTIL) (2.5 MG/3ML) 0.083% nebulizer solution Take 3 mLs (2.5 mg total) by nebulization every 4 (four) hours as needed for wheezing or shortness of breath. 75 mL 1   albuterol (VENTOLIN HFA) 108 (90 Base) MCG/ACT inhaler Inhale 2 puffs into the lungs every 4 (four) hours as needed for wheezing or shortness of breath. 18 g 1   APPLE CIDER VINEGAR PO Take by mouth. gummy     atorvastatin (LIPITOR) 20 MG tablet Take 20 mg by mouth at bedtime.  0   azelastine (ASTELIN) 0.1 % nasal spray Use in each nostril as directed 30 mL 2   BLACK ELDERBERRY PO Take by mouth.     budesonide-formoterol (SYMBICORT) 160-4.5 MCG/ACT inhaler INHALE 2 PUFFS INTO THE LUNGS DAILY WITH SPACER 10.2 g 5   carvedilol (COREG) 6.25 MG tablet Take 6.25 mg by mouth 2 (two) times daily.  0   cetirizine (ZYRTEC) 10 MG tablet TK 1 T PO QD     dorzolamide (TRUSOPT) 2 % ophthalmic solution Place 1 drop into both eyes 3 (three) times daily. 1 drop both eyes in morning and at dinner     EPINEPHrine (EPIPEN 2-PAK) 0.3 mg/0.3 mL IJ SOAJ injection Inject 0.3 mg into the muscle as needed for anaphylaxis. 0.3 mL 1   FEROSUL 325 (65 Fe) MG tablet Take 325 mg by mouth daily.     furosemide (LASIX) 40 MG tablet Take 20 mg by mouth 2 (two) times daily.  0   glucose blood test strip TEST once daily  guaiFENesin (MUCINEX) 600 MG 12 hr tablet Take 600 mg by mouth 2 (two) times daily.     Lactobacillus (PROBIOTIC ACIDOPHILUS PO) Take by mouth.     latanoprost (XALATAN) 0.005 % ophthalmic solution Place 1 drop into both eyes at bedtime.     losartan (COZAAR) 25 MG tablet Take 25 mg by mouth daily.  0   mometasone (NASONEX) 50 MCG/ACT nasal spray INSTILL 1 TO 2 SPRAYS INTO EACH NOSTRIL AS NEEDED FOR STUFFY NOSE 17 g 2   montelukast (SINGULAIR) 10 MG tablet TAKE 1 TABLET(10 MG) BY MOUTH AT BEDTIME 30 tablet 5   Multiple Vitamins-Minerals (CENTRUM SILVER PO) Take  1 tablet by mouth daily.     olopatadine (PATANOL) 0.1 % ophthalmic solution 1 drop 2 (two) times daily.     Omega-3 Fatty Acids (FISH OIL) 1200 MG CAPS Take 1,200 mg by mouth 2 (two) times daily.     ONETOUCH DELICA LANCETS 33G MISC TEST ONCE DAILY AS DIRECTED     Polyethyl Glycol-Propyl Glycol (SYSTANE PRESERVATIVE FREE OP) Apply to eye.     SYMBICORT 160-4.5 MCG/ACT inhaler 2 puffs 2 times daily for 1-2 weeks during asthma flare symptoms. 10.2 g 5   Turmeric 500 MG TABS Take 1 tablet by mouth daily with supper.     vitamin C (ASCORBIC ACID) 500 MG tablet Take 500 mg by mouth daily.     No current facility-administered medications for this visit.     Known medication allergies: Allergies  Allergen Reactions   Iodides Itching    TOTAL BODY   Peanut Oil Other (See Comments)    Recent allergy testing   Shellfish Allergy Other (See Comments)    Per allergy testing   Soybean-Containing Drug Products Other (See Comments)    Per allergy testing   Vaccinium Angustifolium Other (See Comments)    Per allergy testing   Mestinon [Pyridostigmine Bromide] Diarrhea    "Stomach cramps, blood in stool"   Penicillins Itching    Has patient had a PCN reaction causing immediate rash, facial/tongue/throat swelling, SOB or lightheadedness with hypotension: no-- pt did not have rash but was itchy all over body Has patient had a PCN reaction causing severe rash involving mucus membranes or skin necrosis: no Has patient had a PCN reaction that required hospitalization no Has patient had a PCN reaction occurring within the last 10 years: no If all of the above answers are "NO", then may proceed with Cephalosporin use.       Physical examination: Blood pressure (!) 144/64, pulse (!) 51, temperature 98.3 F (36.8 C), temperature source Temporal, resp. rate 18, weight 159 lb 1.6 oz (72.2 kg), SpO2 98%.  General: Alert, interactive, in no acute distress. HEENT: PERRLA, TMs pearly gray, turbinates  non-edematous without discharge, post-pharynx non erythematous. Neck: Supple without lymphadenopathy. Lungs: Clear to auscultation without wheezing, rhonchi or rales. {no increased work of breathing. CV: Normal S1, S2 without murmurs. Abdomen: Nondistended, nontender. Skin: Warm and dry, without lesions or rashes. Extremities:  No clubbing, cyanosis or edema. Neuro:   Grossly intact.  Diagnositics/Labs: None today  Assessment and plan: Moderate persistent asthma - Remains well controlled at this time - Daily asthma medication: Singulair 10mg  daily at bedtime - Asthma Action plan (if you have a respiratory illness or asthma flare): take Symbicort 160/4.5 2 puffs daily with spacer for 1-2 weeks  - Rescue medications: ProAir 4 puffs every 4-6 hours as needed.   - Asthma control goals:  * Full participation in  all desired activities (may need albuterol before activity) * Albuterol use two time or less a week on average (not counting use with activity) * Cough interfering with sleep two time or less a month * Oral steroids no more than once a year * No hospitalizations   Allergic rhinoconjunctivitis - Use Astelin up to 2 sprays twice daily as needed for runny nose - Use Nasonex up to 2 sprays each nostril as needed for stuffy nose - Use cetirizine 10mg  daily as needed for allergy symptom relief  Adverse food reactions (peanuts, soy, shellfish) - Continue to avoid these foods. - Have access to Epipen in case of allergic reaction   Return in about 6 months or sooner if needed   I appreciate the opportunity to take part in Kennedey's care. Please do not hesitate to contact me with questions.  Sincerely,   Margo Aye, MD Allergy/Immunology [facility]

## 2023-04-10 NOTE — Patient Instructions (Addendum)
Moderate persistent asthma - Remains well controlled at this time - Daily asthma medication: Singulair 10mg  daily at bedtime - Asthma Action plan (if you have a respiratory illness or asthma flare): take Symbicort 160/4.5 2 puffs daily with spacer for 1-2 weeks  - Rescue medications: ProAir 4 puffs every 4-6 hours as needed.   - Asthma control goals:  * Full participation in all desired activities (may need albuterol before activity) * Albuterol use two time or less a week on average (not counting use with activity) * Cough interfering with sleep two time or less a month * Oral steroids no more than once a year * No hospitalizations   Allergic rhinoconjunctivitis - Use Astelin up to 2 sprays twice daily as needed for runny nose - Use Nasonex up to 2 sprays each nostril as needed for stuffy nose - Use cetirizine 10mg  daily as needed for allergy symptom relief  Adverse food reactions (peanuts, soy, shellfish) - Continue to avoid these foods. - Have access to Epipen in case of allergic reaction    Return in about 6 months or sooner if needed

## 2023-04-14 ENCOUNTER — Other Ambulatory Visit: Payer: Self-pay

## 2023-04-14 MED ORDER — AZELASTINE HCL 0.1 % NA SOLN: NASAL | 5 refills | Status: DC

## 2023-04-14 MED ORDER — MOMETASONE FUROATE 50 MCG/ACT NA SUSP: NASAL | 5 refills | Status: DC

## 2023-04-22 ENCOUNTER — Other Ambulatory Visit: Payer: Self-pay

## 2023-04-22 MED ORDER — AZELASTINE HCL 0.1 % NA SOLN: NASAL | 5 refills | Status: DC

## 2023-10-09 ENCOUNTER — Ambulatory Visit: Payer: Medicare Other | Admitting: Allergy

## 2023-11-03 ENCOUNTER — Other Ambulatory Visit: Payer: Self-pay | Admitting: Allergy

## 2023-12-12 ENCOUNTER — Ambulatory Visit (INDEPENDENT_AMBULATORY_CARE_PROVIDER_SITE_OTHER): Admitting: Allergy

## 2023-12-12 ENCOUNTER — Encounter: Payer: Self-pay | Admitting: Allergy

## 2023-12-12 ENCOUNTER — Other Ambulatory Visit: Payer: Self-pay

## 2023-12-12 VITALS — BP 122/64 | HR 58 | Temp 98.4°F | Ht <= 58 in | Wt 155.6 lb

## 2023-12-12 DIAGNOSIS — T7800XD Anaphylactic reaction due to unspecified food, subsequent encounter: Secondary | ICD-10-CM

## 2023-12-12 DIAGNOSIS — J3089 Other allergic rhinitis: Secondary | ICD-10-CM

## 2023-12-12 DIAGNOSIS — J4540 Moderate persistent asthma, uncomplicated: Secondary | ICD-10-CM | POA: Diagnosis not present

## 2023-12-12 MED ORDER — EPINEPHRINE 0.3 MG/0.3ML IJ SOAJ: 0.3000 mg | INTRAMUSCULAR | 1 refills | Status: AC | PRN

## 2023-12-12 MED ORDER — MOMETASONE FUROATE 50 MCG/ACT NA SUSP: NASAL | 5 refills | Status: DC

## 2023-12-12 NOTE — Patient Instructions (Addendum)
 Moderate persistent asthma - Remains well controlled at this time.  Lung function testing today is normal! - Daily asthma medication: Singulair  10mg  daily at bedtime - Asthma Action plan (if you have a respiratory illness or asthma flare): take Symbicort  160/4.5 2 puffs daily with spacer for 1-2 weeks  - Rescue medications: ProAir  4 puffs every 4-6 hours as needed.   - Asthma control goals:  * Full participation in all desired activities (may need albuterol  before activity) * Albuterol  use two time or less a week on average (not counting use with activity) * Cough interfering with sleep two time or less a month * Oral steroids no more than once a year * No hospitalizations   Allergic rhinoconjunctivitis - Use Astelin  up to 2 sprays twice daily as needed for runny nose - Use Nasonex  up to 2 sprays each nostril as needed for stuffy nose - Use cetirizine  10mg  daily as needed for allergy symptom relief  Adverse food reactions (peanuts, soy, shellfish) - Continue to avoid these foods. - Have access to Epipen  in case of allergic reaction.  Will update this prescription.     Return in about 6 months or sooner if needed

## 2023-12-12 NOTE — Progress Notes (Signed)
 Follow-up Note  RE: Nicole Holland MRN: [id number] DOB: 13-Jun-1936 Date of Office Visit: 12/12/2023   History of present illness: Nicole Holland is a 88 y.o. female presenting today for follow-up of asthma, allergic rhinoconjunctivitis and food allergy. She was last seen in the office on 04/10/23 by myself.   Discussed the use of AI scribe software for clinical note transcription with the patient, who gave verbal consent to proceed.  Since the last visit in October, she has not undergone any surgeries or hospitalizations. The spring pollen season caused some discomfort, but it was less severe than in previous years. She has been effectively managing her symptoms with cetirizine  and Singulair . Additionally, she uses two nasal sprays, Nasonex  and Astelin , which help maintain open nasal passages and prevent excessive runniness.  She has not needed to use her rescue inhaler and has not used Symbicort  recently, although it remains available. She did not experience any colds during the winter season.  Her EpiPen , last refilled in 2023, has not been used. Despite a dislike for needles, she understands the importance of having an up-to-date EpiPen  in case of an allergic reaction.  She continues to avoid peanuts, soy or shellfish.     Review of systems: 10pt ROS negative unless noted above in HPI   Past medical/social/surgical/family history have been reviewed and are unchanged unless specifically indicated below.  No changes  Medication List: Current Outpatient Medications  Medication Sig Dispense Refill   albuterol  (PROVENTIL ) (2.5 MG/3ML) 0.083% nebulizer solution Take 3 mLs (2.5 mg total) by nebulization every 4 (four) hours as needed for wheezing or shortness of breath. 75 mL 1   albuterol  (VENTOLIN  HFA) 108 (90 Base) MCG/ACT inhaler Inhale 2 puffs into the lungs every 4 (four) hours as needed for wheezing or shortness of breath. 18 g 1   APPLE CIDER VINEGAR PO Take by mouth. gummy      atorvastatin  (LIPITOR) 20 MG tablet Take 20 mg by mouth at bedtime.  0   azelastine  (ASTELIN ) 0.1 % nasal spray 1-2 sprays each nostril 2(TWO) Times daily as needed for runny nose. 30 mL 5   BLACK ELDERBERRY PO Take by mouth.     budesonide -formoterol  (SYMBICORT ) 160-4.5 MCG/ACT inhaler INHALE 2 PUFFS INTO THE LUNGS DAILY WITH SPACER 10.2 g 5   carvedilol  (COREG ) 6.25 MG tablet Take 6.25 mg by mouth 2 (two) times daily.  0   cetirizine  (ZYRTEC ) 10 MG tablet Take 1 tablet (10 mg total) by mouth daily. 30 tablet 5   clotrimazole (LOTRIMIN) 1 % cream Apply 1 Application topically 2 (two) times daily.     dorzolamide (TRUSOPT) 2 % ophthalmic solution Place 1 drop into both eyes 3 (three) times daily. 1 drop both eyes in morning and at dinner     EPINEPHrine  (EPIPEN  2-PAK) 0.3 mg/0.3 mL IJ SOAJ injection Inject 0.3 mg into the muscle as needed for anaphylaxis. 0.3 mL 1   FEROSUL 325 (65 Fe) MG tablet Take 325 mg by mouth daily.     furosemide (LASIX) 40 MG tablet Take 20 mg by mouth 2 (two) times daily.  0   glucose blood test strip TEST once daily     guaiFENesin (MUCINEX) 600 MG 12 hr tablet Take 600 mg by mouth 2 (two) times daily.     Lactobacillus (PROBIOTIC ACIDOPHILUS PO) Take by mouth.     latanoprost  (XALATAN ) 0.005 % ophthalmic solution Place 1 drop into both eyes at bedtime.     losartan (COZAAR)  25 MG tablet Take 25 mg by mouth daily.  0   Misc. Devices MISC Patient to receive 3 foot cane and rollator walker with chair     mometasone  (NASONEX ) 50 MCG/ACT nasal spray INSTILL 1 TO 2 SPRAYS INTO EACH NOSTRIL AS NEEDED FOR STUFFY NOSE 17 g 5   montelukast  (SINGULAIR ) 10 MG tablet TAKE 1 TABLET(10 MG) BY MOUTH AT BEDTIME 30 tablet 5   Multiple Vitamins-Minerals (CENTRUM SILVER PO) Take 1 tablet by mouth daily.     olopatadine  (PATANOL) 0.1 % ophthalmic solution 1 drop 2 (two) times daily.     Omega-3 Fatty Acids (FISH OIL) 1200 MG CAPS Take 1,200 mg by mouth 2 (two) times daily.      ONETOUCH DELICA LANCETS 33G MISC TEST ONCE DAILY AS DIRECTED     Polyethyl Glycol-Propyl Glycol (SYSTANE PRESERVATIVE FREE OP) Apply to eye.     SYMBICORT  160-4.5 MCG/ACT inhaler 2 puffs 2 times daily for 1-2 weeks during asthma flare symptoms. 10.2 g 5   Turmeric 500 MG TABS Take 1 tablet by mouth daily with supper.     vitamin C (ASCORBIC ACID) 500 MG tablet Take 500 mg by mouth daily.     No current facility-administered medications for this visit.     Known medication allergies: Allergies  Allergen Reactions   Iodides Itching    TOTAL BODY   Peanut Oil Other (See Comments)    Recent allergy testing   Shellfish Allergy Other (See Comments)    Per allergy testing   Soybean-Containing Drug Products Other (See Comments)    Per allergy testing   Vaccinium Angustifolium Other (See Comments)    Per allergy testing   Mestinon  [Pyridostigmine  Bromide] Diarrhea    Stomach cramps, blood in stool   Penicillins Itching    Has patient had a PCN reaction causing immediate rash, facial/tongue/throat swelling, SOB or lightheadedness with hypotension: no-- pt did not have rash but was itchy all over body Has patient had a PCN reaction causing severe rash involving mucus membranes or skin necrosis: no Has patient had a PCN reaction that required hospitalization no Has patient had a PCN reaction occurring within the last 10 years: no If all of the above answers are NO, then may proceed with Cephalosporin use.       Physical examination: Blood pressure 122/64, pulse (!) 58, temperature 98.4 F (36.9 C), temperature source Temporal, height 4' 9.48 (1.46 m), weight 155 lb 9.6 oz (70.6 kg), SpO2 99%.  General: Alert, interactive, in no acute distress. HEENT: PERRLA, TMs pearly gray, turbinates non-edematous without discharge, post-pharynx non erythematous. Neck: Supple without lymphadenopathy. Lungs: Clear to auscultation without wheezing, rhonchi or rales. {no increased work of  breathing. CV: Normal S1, S2 without murmurs. Abdomen: Nondistended, nontender. Skin: Warm and dry, without lesions or rashes. Extremities:  No clubbing, cyanosis or edema. Neuro:   Grossly intact.  Diagnostics/Labs:  Spirometry: FEV1: 1.02L 85%, FVC: 1.48L 95%, ratio consistent with nonobstructive pattern.    Assessment and plan: Moderate persistent asthma - Remains well controlled at this time.  Lung function testing today is normal! - Daily asthma medication: Singulair  10mg  daily at bedtime - Asthma Action plan (if you have a respiratory illness or asthma flare): take Symbicort  160/4.5 2 puffs daily with spacer for 1-2 weeks  - Rescue medications: ProAir  4 puffs every 4-6 hours as needed.   - Asthma control goals:  * Full participation in all desired activities (may need albuterol  before activity) * Albuterol  use two  time or less a week on average (not counting use with activity) * Cough interfering with sleep two time or less a month * Oral steroids no more than once a year * No hospitalizations   Allergic rhinoconjunctivitis - Use Astelin  up to 2 sprays twice daily as needed for runny nose - Use Nasonex  up to 2 sprays each nostril as needed for stuffy nose - Use cetirizine  10mg  daily as needed for allergy symptom relief  Adverse food reactions (peanuts, soy, shellfish) - Continue to avoid these foods. - Have access to Epipen  in case of allergic reaction.  Will update this prescription.    Return in about 6 months or sooner if needed  I appreciate the opportunity to take part in Marcelia's care. Please do not hesitate to contact me with questions.  Sincerely,   Danita Brain, MD Allergy/Immunology [facility]

## 2023-12-15 ENCOUNTER — Other Ambulatory Visit: Payer: Self-pay | Admitting: Allergy

## 2023-12-15 NOTE — Telephone Encounter (Signed)
 Refill for Zyrtec  10 mg x 1 with 5 refills sent to [facility].

## 2024-04-12 ENCOUNTER — Other Ambulatory Visit: Payer: Self-pay | Admitting: Allergy

## 2024-05-06 ENCOUNTER — Other Ambulatory Visit: Payer: Self-pay | Admitting: Allergy

## 2024-05-26 ENCOUNTER — Other Ambulatory Visit: Payer: Self-pay | Admitting: *Deleted

## 2024-05-26 ENCOUNTER — Telehealth: Payer: Self-pay | Admitting: Allergy

## 2024-05-26 MED ORDER — MONTELUKAST SODIUM 10 MG PO TABS: 10.0000 mg | ORAL_TABLET | Freq: Every day | ORAL | 1 refills | Status: DC

## 2024-05-26 NOTE — Telephone Encounter (Signed)
 Refill has been sent in to requested pharmacy. Called patient and informed, patient verbalized understanding.

## 2024-05-26 NOTE — Telephone Encounter (Signed)
 Pt called stating she needs a refill on montelukast  (SINGULAIR ) 10 MG tablet [[id number]]  and sent to the [facility] on [address]

## 2024-05-27 ENCOUNTER — Other Ambulatory Visit: Payer: Self-pay

## 2024-05-27 ENCOUNTER — Encounter: Payer: Self-pay | Admitting: Allergy

## 2024-05-27 ENCOUNTER — Ambulatory Visit: Admitting: Allergy

## 2024-05-27 VITALS — BP 122/60 | HR 60 | Temp 98.3°F | Ht <= 58 in | Wt 144.1 lb

## 2024-05-27 DIAGNOSIS — J3089 Other allergic rhinitis: Secondary | ICD-10-CM

## 2024-05-27 DIAGNOSIS — J4540 Moderate persistent asthma, uncomplicated: Secondary | ICD-10-CM

## 2024-05-27 DIAGNOSIS — T7800XD Anaphylactic reaction due to unspecified food, subsequent encounter: Secondary | ICD-10-CM | POA: Diagnosis not present

## 2024-05-27 NOTE — Progress Notes (Unsigned)
 Follow-up Note  RE: LIALA CODISPOTI MRN: [id number] DOB: 1935/11/17 Date of Office Visit: 05/27/2024   History of present illness: Nicole CUNNINGHAM is a 88 y.o. female presenting today for follow-up of asthma, allergic rhinoconjunctivitis and adverse food reaction.  She was last seen in the office on 12/12/23 by myself.   Discussed the use of AI scribe software for clinical note transcription with the patient, who gave verbal consent to proceed.  She has not experienced any recent respiratory infections, such as cold, COVID, flu, or RSV, and has received her flu shot this season. No exacerbations of asthma have occurred that required the use of her rescue albuterol  inhaler.  Her asthma management includes a nightly tablet, Singulair , and Symbicort  inhaler to be used as needed if she develops cold symptoms. She uses mometasone  nasal sprays, one in the morning and one at night, which help with nasal congestion and drainage. She also takes Tylenol  arthritis pills, especially during cold and rainy weather, which she finds bothersome.  She has not had any recent allergic reactions requiring the use of her EpiPen .  No recent respiratory infections or breathing issues requiring albuterol . No recent allergic reactions.     Review of systems: 10pt ROS negative unless noted above in HPI   Past medical/social/surgical/family history have been reviewed and are unchanged unless specifically indicated below.  No changes  Medication List: Current Outpatient Medications  Medication Sig Dispense Refill   albuterol  (PROVENTIL ) (2.5 MG/3ML) 0.083% nebulizer solution Take 3 mLs (2.5 mg total) by nebulization every 4 (four) hours as needed for wheezing or shortness of breath. 75 mL 1   albuterol  (VENTOLIN  HFA) 108 (90 Base) MCG/ACT inhaler Inhale 2 puffs into the lungs every 4 (four) hours as needed for wheezing or shortness of breath. 18 g 1   APPLE CIDER VINEGAR PO Take by mouth. gummy      atorvastatin  (LIPITOR) 20 MG tablet Take 20 mg by mouth at bedtime.  0   BLACK ELDERBERRY PO Take by mouth.     brimonidine (ALPHAGAN) 0.2 % ophthalmic solution Place into both eyes 2 (two) times daily.     budesonide -formoterol  (SYMBICORT ) 160-4.5 MCG/ACT inhaler INHALE 2 PUFFS INTO THE LUNGS DAILY WITH SPACER 10.2 g 5   carvedilol  (COREG ) 6.25 MG tablet Take 6.25 mg by mouth 2 (two) times daily.  0   clotrimazole (LOTRIMIN) 1 % cream Apply 1 Application topically 2 (two) times daily.     dorzolamide (TRUSOPT) 2 % ophthalmic solution Place 1 drop into both eyes 3 (three) times daily. 1 drop both eyes in morning and at dinner     EPINEPHrine  (EPIPEN  2-PAK) 0.3 mg/0.3 mL IJ SOAJ injection Inject 0.3 mg into the muscle as needed for anaphylaxis. 0.3 mL 1   FEROSUL 325 (65 Fe) MG tablet Take 325 mg by mouth daily.     furosemide (LASIX) 40 MG tablet Take 20 mg by mouth 2 (two) times daily.  0   glucose blood test strip TEST once daily     guaiFENesin (MUCINEX) 600 MG 12 hr tablet Take 600 mg by mouth 2 (two) times daily.     Lactobacillus (PROBIOTIC ACIDOPHILUS PO) Take by mouth.     latanoprost  (XALATAN ) 0.005 % ophthalmic solution Place 1 drop into both eyes at bedtime.     losartan (COZAAR) 25 MG tablet Take 25 mg by mouth daily.  0   Misc. Devices MISC Patient to receive 3 foot cane and rollator walker with chair  Multiple Vitamins-Minerals (CENTRUM SILVER PO) Take 1 tablet by mouth daily.     olopatadine  (PATANOL) 0.1 % ophthalmic solution 1 drop 2 (two) times daily.     Omega-3 Fatty Acids (FISH OIL) 1200 MG CAPS Take 1,200 mg by mouth 2 (two) times daily.     ONETOUCH DELICA LANCETS 33G MISC TEST ONCE DAILY AS DIRECTED     Polyethyl Glycol-Propyl Glycol (SYSTANE PRESERVATIVE FREE OP) Apply to eye.     SIMBRINZA 1-0.2 % SUSP Apply 1 drop to eye 2 (two) times daily.     SYMBICORT  160-4.5 MCG/ACT inhaler 2 puffs 2 times daily for 1-2 weeks during asthma flare symptoms. 10.2 g 5   Turmeric  500 MG TABS Take 1 tablet by mouth daily with supper.     vitamin C (ASCORBIC ACID) 500 MG tablet Take 500 mg by mouth daily.     azelastine  (ASTELIN ) 0.1 % nasal spray 1-2 sprays each nostril 2(TWO) Times daily as needed for runny nose. 30 mL 5   cetirizine  (ZYRTEC ) 10 MG tablet TAKE 1 TABLET(10 MG) BY MOUTH DAILY 30 tablet 5   mometasone  (NASONEX ) 50 MCG/ACT nasal spray INSTILL 1 TO 2 SPRAYS INTO EACH NOSTRIL AS NEEDED FOR STUFFY NOSE 17 g 5   montelukast  (SINGULAIR ) 10 MG tablet Take 1 tablet (10 mg total) by mouth at bedtime. 90 tablet 1   No current facility-administered medications for this visit.     Known medication allergies: Allergies[1]   Physical examination: Blood pressure 122/60, pulse 60, temperature 98.3 F (36.8 C), temperature source Temporal, height 4' 9 (1.448 m), weight 144 lb 1.6 oz (65.4 kg), SpO2 98%.  General: Alert, interactive, in no acute distress. HEENT: PERRLA, TMs pearly gray, turbinates non-edematous without discharge, post-pharynx non erythematous. Neck: Supple without lymphadenopathy. Lungs: Clear to auscultation without wheezing, rhonchi or rales. {no increased work of breathing. CV: Normal S1, S2 without murmurs. Abdomen: Nondistended, nontender. Skin: Warm and dry, without lesions or rashes. Extremities:  No clubbing, cyanosis or edema. Neuro:   Grossly intact.  Diagnostics/Labs: None today  Assessment and plan: Moderate persistent asthma - Remains well controlled at this time.  - Will plan to get lung function testing at next visit - Daily asthma medication: Singulair  10mg  daily at bedtime - Asthma Action plan (if you have a respiratory illness or asthma flare): take Symbicort  160/4.5 2 puffs daily with spacer for 1-2 weeks  - Rescue medications: ProAir  4 puffs every 4-6 hours as needed.   - Asthma control goals:  * Full participation in all desired activities (may need albuterol  before activity) * Albuterol  use two time or less a week on  average (not counting use with activity) * Cough interfering with sleep two time or less a month * Oral steroids no more than once a year * No hospitalizations   Allergic rhinoconjunctivitis - Use Astelin  up to 2 sprays twice daily as needed for runny nose - Use Nasonex  up to 2 sprays each nostril as needed for stuffy nose - Use cetirizine  10mg  daily as needed for allergy symptom relief  Adverse food reactions (peanuts, soy, shellfish) - Continue to avoid these foods. - Have access to Epipen  in case of allergic reaction.  Return in about 6 months or sooner if needed  I appreciate the opportunity to take part in Janene's care. Please do not hesitate to contact me with questions.  Sincerely,   Danita Brain, MD Allergy/Immunology [facility]       [1]  Allergies Allergen Reactions   Iodides Itching    TOTAL BODY   Peanut Oil Other (See Comments)    Recent allergy testing   Shellfish Allergy Other (See Comments)    Per allergy testing   Soybean-Containing Drug Products Other (See Comments)    Per allergy testing   Vaccinium Angustifolium Other (See Comments)    Per allergy testing   Mestinon  [Pyridostigmine  Bromide] Diarrhea    Stomach cramps, blood in stool   Penicillins Itching    Has patient had a PCN reaction causing immediate rash, facial/tongue/throat swelling, SOB or lightheadedness with hypotension: no-- pt did not have rash but was itchy all over body Has patient had a PCN reaction causing severe rash involving mucus membranes or skin necrosis: no Has patient had a PCN reaction that required hospitalization no Has patient had a PCN reaction occurring within the last 10 years: no If all of the above answers are NO, then may proceed with Cephalosporin use.

## 2024-05-27 NOTE — Patient Instructions (Addendum)
 Moderate persistent asthma - Remains well controlled at this time.  - Will plan to get lung function testing at next visit - Daily asthma medication: Singulair  10mg  daily at bedtime - Asthma Action plan (if you have a respiratory illness or asthma flare): take Symbicort  160/4.5 2 puffs daily with spacer for 1-2 weeks  - Rescue medications: ProAir  4 puffs every 4-6 hours as needed.   - Asthma control goals:  * Full participation in all desired activities (may need albuterol  before activity) * Albuterol  use two time or less a week on average (not counting use with activity) * Cough interfering with sleep two time or less a month * Oral steroids no more than once a year * No hospitalizations   Allergic rhinoconjunctivitis - Use Astelin  up to 2 sprays twice daily as needed for runny nose - Use Nasonex  up to 2 sprays each nostril as needed for stuffy nose - Use cetirizine  10mg  daily as needed for allergy symptom relief  Adverse food reactions (peanuts, soy, shellfish) - Continue to avoid these foods. - Have access to Epipen  in case of allergic reaction.  Return in about 6 months or sooner if needed

## 2024-05-28 ENCOUNTER — Other Ambulatory Visit: Payer: Self-pay | Admitting: Allergy

## 2024-05-28 MED ORDER — AZELASTINE HCL 0.1 % NA SOLN: NASAL | 5 refills | Status: AC

## 2024-05-28 MED ORDER — MOMETASONE FUROATE 50 MCG/ACT NA SUSP: NASAL | 5 refills | Status: AC

## 2024-05-28 MED ORDER — MONTELUKAST SODIUM 10 MG PO TABS: 10.0000 mg | ORAL_TABLET | Freq: Every day | ORAL | 1 refills | Status: AC

## 2024-10-28 ENCOUNTER — Ambulatory Visit: Admitting: Dermatology

## 2024-11-25 ENCOUNTER — Ambulatory Visit: Admitting: Allergy
